# Patient Record
Sex: Female | Born: 1965
Health system: Southern US, Community
[De-identification: ages and names within clinical notes are randomized; demographics above are authoritative.]

## PROBLEM LIST (undated history)

## (undated) DIAGNOSIS — F419 Anxiety disorder, unspecified: Secondary | ICD-10-CM

## (undated) DIAGNOSIS — C4490 Unspecified malignant neoplasm of skin, unspecified: Secondary | ICD-10-CM

## (undated) DIAGNOSIS — K8020 Calculus of gallbladder without cholecystitis without obstruction: Secondary | ICD-10-CM

## (undated) DIAGNOSIS — N920 Excessive and frequent menstruation with regular cycle: Secondary | ICD-10-CM

## (undated) DIAGNOSIS — D219 Benign neoplasm of connective and other soft tissue, unspecified: Secondary | ICD-10-CM

## (undated) DIAGNOSIS — K219 Gastro-esophageal reflux disease without esophagitis: Secondary | ICD-10-CM

## (undated) DIAGNOSIS — R319 Hematuria, unspecified: Secondary | ICD-10-CM

## (undated) DIAGNOSIS — G43909 Migraine, unspecified, not intractable, without status migrainosus: Secondary | ICD-10-CM

## (undated) DIAGNOSIS — Z86010 Personal history of colonic polyps: Secondary | ICD-10-CM

## (undated) HISTORY — DX: Excessive and frequent menstruation with regular cycle: N92.0

## (undated) HISTORY — DX: Unspecified malignant neoplasm of skin, unspecified: C44.90

## (undated) HISTORY — DX: Hematuria, unspecified: R31.9

## (undated) HISTORY — DX: Migraine, unspecified, not intractable, without status migrainosus: G43.909

## (undated) HISTORY — PX: CYSTOSCOPY: SUR368

## (undated) HISTORY — DX: Benign neoplasm of connective and other soft tissue, unspecified: D21.9

## (undated) HISTORY — DX: Anxiety disorder, unspecified: F41.9

## (undated) HISTORY — DX: Gastro-esophageal reflux disease without esophagitis: K21.9

## (undated) HISTORY — DX: Calculus of gallbladder without cholecystitis without obstruction: K80.20

## (undated) HISTORY — PX: OTHER SURGICAL HISTORY: SHX169

## (undated) HISTORY — DX: Personal history of colonic polyps: Z86.010

---

## 2007-09-11 ENCOUNTER — Ambulatory Visit: Payer: Self-pay | Admitting: Gastroenterology

## 2007-10-17 ENCOUNTER — Ambulatory Visit: Payer: Self-pay

## 2009-06-03 ENCOUNTER — Ambulatory Visit: Payer: Self-pay

## 2009-07-06 ENCOUNTER — Observation Stay: Payer: Self-pay | Admitting: Internal Medicine

## 2010-06-18 ENCOUNTER — Ambulatory Visit: Payer: Self-pay | Admitting: Obstetrics and Gynecology

## 2010-12-16 DIAGNOSIS — Z860100 Personal history of colon polyps, unspecified: Secondary | ICD-10-CM

## 2010-12-16 DIAGNOSIS — K219 Gastro-esophageal reflux disease without esophagitis: Secondary | ICD-10-CM

## 2010-12-16 HISTORY — DX: Personal history of colonic polyps: Z86.010

## 2010-12-16 HISTORY — DX: Personal history of colon polyps, unspecified: Z86.0100

## 2010-12-16 HISTORY — DX: Gastro-esophageal reflux disease without esophagitis: K21.9

## 2010-12-29 ENCOUNTER — Ambulatory Visit: Payer: Self-pay | Admitting: Gastroenterology

## 2010-12-31 LAB — PATHOLOGY REPORT

## 2011-03-02 ENCOUNTER — Encounter: Payer: Self-pay | Admitting: Internal Medicine

## 2011-03-02 ENCOUNTER — Ambulatory Visit (INDEPENDENT_AMBULATORY_CARE_PROVIDER_SITE_OTHER): Payer: PRIVATE HEALTH INSURANCE | Admitting: Internal Medicine

## 2011-03-02 DIAGNOSIS — N920 Excessive and frequent menstruation with regular cycle: Secondary | ICD-10-CM | POA: Insufficient documentation

## 2011-03-02 DIAGNOSIS — Z9289 Personal history of other medical treatment: Secondary | ICD-10-CM | POA: Insufficient documentation

## 2011-03-02 DIAGNOSIS — Z9189 Other specified personal risk factors, not elsewhere classified: Secondary | ICD-10-CM

## 2011-03-02 DIAGNOSIS — G43909 Migraine, unspecified, not intractable, without status migrainosus: Secondary | ICD-10-CM | POA: Insufficient documentation

## 2011-03-02 DIAGNOSIS — Z86010 Personal history of colonic polyps: Secondary | ICD-10-CM | POA: Insufficient documentation

## 2011-03-02 MED ORDER — ZOLMITRIPTAN 5 MG NA SOLN: 1.0000 | NASAL | Status: DC | PRN

## 2011-03-02 NOTE — Progress Notes (Signed)
  Subjective:    Patient ID: Stephanie Henderson, female    DOB: June 08, 1965, 45 y.o.   MRN: [id number]  HPI  45 yo white female is here to establish primary care.  Shefeels generally well, but has a history of migrain headaches, whoch are not occurring often.    Review of Systems     Objective:   Physical Exam        Assessment & Plan:   Subjective:    Stephanie Henderson is a 45 y.o. female who presents for evaluation of headache. Symptoms began about 20 or more years ago. Generally, the headaches last about 2 days and occur monthly at the time of her period. The headaches do not seem to be related to any time of the day. The headaches are usually pounding and are located in temple.  The patient rates her most severe headaches a 7 on a scale from 1 to 10. Recently, the headaches have been stable. Work attendance or other daily activities are affected by the headaches. Precipitating factors include: menses. The headaches are usually not preceded by an aura. Associated neurologic symptoms: decreased physical activity. The patient denies dizziness, loss of balance, speech difficulties and vision problems. Home treatment has included Imitrex oral with some improvement. Other history includes: migraine headaches diagnosed in the past. Family history includes no known family members with significant headaches.  The following portions of the patient's history were reviewed and updated as appropriate: allergies, current medications, past family history, past medical history, past social history, past surgical history and problem list.  Review of Systems A comprehensive review of systems was negative.    Objective:    BP 98/60  Pulse 66  Temp(Src) 99 F (37.2 C) (Oral)  Wt 106 lb (48.081 kg)  SpO2 98%  LMP 02/20/2011 General appearance: alert, cooperative and appears stated age Head: Normocephalic, without obvious abnormality, atraumatic Eyes: conjunctivae/corneas clear. PERRL, EOM's intact. Fundi  benign. Ears: normal TM's and external ear canals both ears Nose: Nares normal. Septum midline. Mucosa normal. No drainage or sinus tenderness. Throat: lips, mucosa, and tongue normal; teeth and gums normal Neck: no adenopathy, no carotid bruit, no JVD, supple, symmetrical, trachea midline and thyroid not enlarged, symmetric, no tenderness/mass/nodules Lungs: clear to auscultation bilaterally Heart: regular rate and rhythm, S1, S2 normal, no murmur, click, rub or gallop and normal apical impulse Abdomen: soft, non-tender; bowel sounds normal; no masses,  no organomegaly    Assessment:    Menstrual migraine    Plan:    Lie in darkened room and apply cold packs as needed for pain. Side effect profile discussed in detail. Asked to keep headache diary. Patient reassured that neurodiagnostic workup not indicated from benign H&P.

## 2011-06-04 ENCOUNTER — Ambulatory Visit: Payer: PRIVATE HEALTH INSURANCE | Admitting: Internal Medicine

## 2011-06-09 ENCOUNTER — Ambulatory Visit: Payer: Self-pay | Admitting: Obstetrics and Gynecology

## 2011-07-28 ENCOUNTER — Ambulatory Visit: Payer: Self-pay | Admitting: Obstetrics and Gynecology

## 2011-10-13 ENCOUNTER — Ambulatory Visit: Payer: Self-pay | Admitting: Obstetrics and Gynecology

## 2011-10-13 LAB — CBC
HCT: 28.9 % — ABNORMAL LOW (ref 35.0–47.0)
MCH: 22.9 pg — ABNORMAL LOW (ref 26.0–34.0)
MCHC: 30.5 g/dL — ABNORMAL LOW (ref 32.0–36.0)
MCV: 75 fL — ABNORMAL LOW (ref 80–100)
Platelet: 243 10*3/uL (ref 150–440)
RBC: 3.84 10*6/uL (ref 3.80–5.20)
RDW: 17.2 % — ABNORMAL HIGH (ref 11.5–14.5)
WBC: 5.7 10*3/uL (ref 3.6–11.0)

## 2011-10-13 LAB — PREGNANCY, URINE: Pregnancy Test, Urine: NEGATIVE m[IU]/mL

## 2011-10-16 HISTORY — PX: VAGINAL HYSTERECTOMY: SUR661

## 2011-10-18 ENCOUNTER — Ambulatory Visit: Payer: Self-pay | Admitting: Obstetrics and Gynecology

## 2012-05-01 ENCOUNTER — Encounter: Payer: Self-pay | Admitting: Internal Medicine

## 2012-05-01 ENCOUNTER — Ambulatory Visit (INDEPENDENT_AMBULATORY_CARE_PROVIDER_SITE_OTHER): Payer: PRIVATE HEALTH INSURANCE | Admitting: Internal Medicine

## 2012-05-01 VITALS — BP 98/56 | HR 65 | Temp 98.4°F | Resp 12 | Ht 62.0 in | Wt 111.5 lb

## 2012-05-01 DIAGNOSIS — J0100 Acute maxillary sinusitis, unspecified: Secondary | ICD-10-CM

## 2012-05-01 MED ORDER — LEVOFLOXACIN 500 MG PO TABS: 500.0000 mg | ORAL_TABLET | Freq: Every day | ORAL | Status: DC

## 2012-05-01 MED ORDER — SUMATRIPTAN SUCCINATE 100 MG PO TABS: 100.0000 mg | ORAL_TABLET | Freq: Once | ORAL | Status: DC

## 2012-05-01 MED ORDER — PREDNISONE (PAK) 10 MG PO TABS: ORAL_TABLET | ORAL | Status: DC

## 2012-05-01 NOTE — Progress Notes (Signed)
Patient ID: Stephanie Henderson, female   DOB: [birth year], 46 y.o.   MRN: [id number]   Patient Active Problem List  Diagnosis  . Migraine syndrome  . Menorrhagia  . GERD (gastroesophageal reflux disease)  . Hx of colonic polyps  . History of mammogram  . Sinusitis, acute maxillary    Subjective:  CC:   Chief Complaint  Patient presents with  . Sinus Problem    HPI:   Stephanie Lippertis a 46 y.o. female who presents  Past Medical History  Diagnosis Date  . Migraine syndrome     since age 45,  sporadic  . Menorrhagia     did not tolerate trial of ocps due to migraines  . GERD (gastroesophageal reflux disease) Aug 2012    with esophagitis by EGD  . Hx of colonic polyps August 2012    repeat due 2016,  5 polyps 2009, clear 2012 Marva Panda)    History reviewed. No pertinent past surgical history.       The following portions of the patient's history were reviewed and updated as appropriate: Allergies, current medications, and problem list.    Review of Systems:   12 Pt  review of systems was negative except those addressed in the HPI,     History   Social History  . Marital Status: Married    Spouse Name: N/A    Number of Children: 2  . Years of Education: N/A   Occupational History  . RN - [employer]    Social History Main Topics  . Smoking status: Never Smoker   . Smokeless tobacco: Never Used  . Alcohol Use: Yes     Comment: Rare  . Drug Use: No  . Sexually Active: Not on file   Other Topics Concern  . Not on file   Social History Narrative  . No narrative on file    Objective:  BP 98/56  Pulse 65  Temp 98.4 F (36.9 C) (Oral)  Resp 12  Ht 5\' 2"  (1.575 m)  Wt 111 lb 8 oz (50.576 kg)  BMI 20.39 kg/m2  SpO2 97%  LMP 02/20/2011  General appearance: alert, cooperative and appears stated age Ears: bilateral injected TMs with serous effusions Throat: lips, mucosa, and tongue normal; teeth and gums normal. Tonsillar  erythema Neck: no adenopathy, no carotid bruit, supple, symmetrical, trachea midline and thyroid not enlarged, symmetric, no tenderness/mass/nodules Back: symmetric, no curvature. ROM normal. No CVA tenderness. Lungs: clear to auscultation bilaterally Heart: regular rate and rhythm, S1, S2 normal, no murmur, click, rub or gallop Abdomen: soft, non-tender; bowel sounds normal; no masses,  no organomegaly Pulses: 2+ and symmetric Skin: Skin color, texture, turgor normal. No rashes or lesions Lymph nodes: Cervical, supraclavicular, and axillary nodes normal.  Assessment and Plan:  Sinusitis, acute maxillary Given chronicity of symptoms, development of facial pain and exam consistent with bacterial URI,  Will treat with empiric antibiotics, decongestants, and saline lavage.    Updated Medication List Outpatient Encounter Prescriptions as of 05/01/2012  Medication Sig Dispense Refill  . pantoprazole (PROTONIX) 40 MG tablet Take 40 mg by mouth daily.        . sucralfate (CARAFATE) 1 G tablet Take 1 g by mouth 3 (three) times daily as needed.        . SUMAtriptan (IMITREX) 100 MG tablet Take 1 tablet (100 mg total) by mouth once. As needed for migraines  10 tablet  2  . [DISCONTINUED] SUMAtriptan (IMITREX) 100 MG tablet Take 100  mg by mouth every 2 (two) hours as needed.        Marland Kitchen levofloxacin (LEVAQUIN) 500 MG tablet Take 1 tablet (500 mg total) by mouth daily.  7 tablet  0  . predniSONE (STERAPRED UNI-PAK) 10 MG tablet 6 tablets on Day 1 , then reduce by 1 tablet daily until gone  21 tablet  0  . zolmitriptan (ZOMIG) 5 MG nasal solution Place 1 spray into the nose as needed for migraine.  6 mL  3     No orders of the defined types were placed in this encounter.    No Follow-up on file.

## 2012-05-01 NOTE — Patient Instructions (Addendum)
You have a sinus/ear infection   .  I am prescribing an antibiotic (levaquin)  and prednisone taper  To manage the infectin and the inflammation in your ear/sinuses.   I also advise use of the following OTC meds to help with your other symptoms.   Take generic OTC benadryl 25 mg every 8 hours for the drainage,  Sudafed PE  10 to 30 mg every 8 hours for the congestion, you may substitute Afrin nasal spray for the nighttime dose of sudafed PE  If needed to prevent insomnia.  flushes your sinuses twice daily with Simply Saline (do over the sink because if you do it right you will spit out globs of mucus)  OTC  Delsym   As needed for cough.  Gargle with salt water as needed for sore throat.

## 2012-05-02 ENCOUNTER — Encounter: Payer: Self-pay | Admitting: Internal Medicine

## 2012-05-02 DIAGNOSIS — J0100 Acute maxillary sinusitis, unspecified: Secondary | ICD-10-CM | POA: Insufficient documentation

## 2012-05-02 NOTE — Assessment & Plan Note (Signed)
Given chronicity of symptoms, development of facial pain and exam consistent with bacterial URI,  Will treat with empiric antibiotics, decongestants, and saline lavage.   

## 2012-11-20 ENCOUNTER — Ambulatory Visit (INDEPENDENT_AMBULATORY_CARE_PROVIDER_SITE_OTHER): Payer: PRIVATE HEALTH INSURANCE | Admitting: Adult Health

## 2012-11-20 ENCOUNTER — Encounter: Payer: Self-pay | Admitting: Adult Health

## 2012-11-20 ENCOUNTER — Telehealth: Payer: Self-pay | Admitting: *Deleted

## 2012-11-20 VITALS — BP 112/66 | HR 90 | Temp 98.8°F | Resp 12 | Wt 112.0 lb

## 2012-11-20 DIAGNOSIS — R1031 Right lower quadrant pain: Secondary | ICD-10-CM

## 2012-11-20 DIAGNOSIS — R350 Frequency of micturition: Secondary | ICD-10-CM

## 2012-11-20 LAB — POCT URINALYSIS DIPSTICK
Bilirubin, UA: NEGATIVE
Glucose, UA: NEGATIVE
Ketones, UA: NEGATIVE
Leukocytes, UA: NEGATIVE
Protein, UA: NEGATIVE

## 2012-11-20 MED ORDER — CIPROFLOXACIN HCL 250 MG PO TABS: 250.0000 mg | ORAL_TABLET | Freq: Two times a day (BID) | ORAL | Status: DC

## 2012-11-20 NOTE — Patient Instructions (Addendum)
  I am sending you for a CT of abdomen and pelvis.  Please do not leave the office until this test has been scheduled.  Once the results are available we will contact you.

## 2012-11-20 NOTE — Addendum Note (Signed)
Addended by: Montine Circle D on: 11/20/2012 02:56 PM   Modules accepted: Orders

## 2012-11-20 NOTE — Telephone Encounter (Signed)
yes

## 2012-11-20 NOTE — Telephone Encounter (Signed)
Would you like a urine culture?  

## 2012-11-20 NOTE — Assessment & Plan Note (Signed)
Tender with exam. Send for CT of abdomen and pelvis.

## 2012-11-20 NOTE — Progress Notes (Signed)
Subjective:    Patient ID: Stephanie Henderson, female    DOB: [birth year], 47 y.o.   MRN: [id number]  HPI  Patient is a pleasant 47 year old female who presents to clinic with complaints of abdominal pain and pelvic pressure x 2 weeks. She went to an urgent care and was treated for UTI with 7 days of Macrobid. Patient still with ongoing symptoms. Symptoms worse with standing, walking and improves with sitting and lying. Denies fever, chills, hematuria. Hx of total hysterectomy for fibroids 2013. Recent follow up with GYN in May 2014 normal.    Past Medical History  Diagnosis Date  . Migraine syndrome     since age 75,  sporadic  . Menorrhagia     did not tolerate trial of ocps due to migraines  . GERD (gastroesophageal reflux disease) Aug 2012    with esophagitis by EGD  . Hx of colonic polyps August 2012    repeat due 2016,  5 polyps 2009, clear 2012 Marva Panda)    Past Surgical History  Procedure Laterality Date  . Vaginal hysterectomy  6/13    Family History  Problem Relation Age of Onset  . Hyperlipidemia Mother   . Mental illness Mother     depression  . Colon cancer Father   . Depression Father   . Cancer Father 75    mets to lung   . Mental illness Father   . Kidney disease Daughter     History   Social History  . Marital Status: Married    Spouse Name: N/A    Number of Children: 2  . 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 Smoking status: Never Smoker   . Smokeless tobacco: Never Used  . Alcohol Use: Yes     Comment: Rare  . Drug Use: No  . Sexually Active: Not on file   Other Topics Concern  . Not on file   Social History Narrative  . No narrative on file    Current Outpatient Prescriptions on File Prior to Visit  Medication Sig Dispense Refill  . pantoprazole (PROTONIX) 40 MG tablet Take 40 mg by mouth daily.        . sucralfate (CARAFATE) 1 G tablet Take 1 g by mouth 3  (three) times daily as needed.        . SUMAtriptan (IMITREX) 100 MG tablet Take 1 tablet (100 mg total) by mouth once. As needed for migraines  10 tablet  2   No current facility-administered medications on file prior to visit.     Review of Systems  Constitutional: Negative for fever and chills.  Gastrointestinal: Positive for abdominal pain and abdominal distention. Negative for nausea, vomiting, diarrhea, constipation and blood in stool.  Genitourinary: Positive for pelvic pain. Negative for dysuria, urgency, flank pain, vaginal discharge and difficulty urinating.       Objective:   Physical Exam  Constitutional: She is oriented to person, place, and time. She appears well-developed and well-nourished. No distress.  Cardiovascular: Normal rate and regular rhythm.   Pulmonary/Chest: Effort normal. No respiratory distress.  Abdominal: Soft. Bowel sounds are normal. She exhibits no distension and no mass. There is tenderness. There is guarding.  Neurological: She is alert and oriented to person, place, and time.  Skin: Skin is warm and dry.  Psychiatric: She has a normal mood and affect. Her behavior is normal. Judgment and thought content  normal.          Assessment & Plan:

## 2012-11-22 ENCOUNTER — Other Ambulatory Visit: Payer: Self-pay | Admitting: Adult Health

## 2012-11-22 ENCOUNTER — Telehealth: Payer: Self-pay | Admitting: Internal Medicine

## 2012-11-22 DIAGNOSIS — N839 Noninflammatory disorder of ovary, fallopian tube and broad ligament, unspecified: Secondary | ICD-10-CM

## 2012-11-22 LAB — URINE CULTURE
Colony Count: NO GROWTH
Organism ID, Bacteria: NO GROWTH

## 2012-11-22 NOTE — Progress Notes (Signed)
Pt notified of CT results and ultrasound appointment. 

## 2012-11-22 NOTE — Telephone Encounter (Signed)
Patient has an apt with [facility] (same place she had her CT) on Friday July 11th at 10:45 a.m. Arrive @ 10:30 for exam. [facility] # [phone].

## 2012-11-22 NOTE — Telephone Encounter (Signed)
Left mess for patient to call our office in reference to her CT results.

## 2012-11-22 NOTE — Telephone Encounter (Signed)
Pt notified of CT results and ultrasound appointment.

## 2012-11-23 ENCOUNTER — Ambulatory Visit: Payer: Self-pay | Admitting: Obstetrics and Gynecology

## 2012-11-23 LAB — US OB TRANSVAGINAL

## 2013-02-22 ENCOUNTER — Telehealth: Payer: Self-pay | Admitting: Internal Medicine

## 2013-02-22 NOTE — Telephone Encounter (Signed)
Pt called to schedule appt for possible sinus infection.  No appt available today/tomorrow with any provider.  Can pt be added on, or does she need to go to an urgent care.  Pt will also call to see if we have any cancellations.

## 2013-02-23 NOTE — Telephone Encounter (Signed)
Spoke with pt, she was seen in Urgent Care yesterday and has been given an antibiotic for sinus infection.

## 2013-07-13 ENCOUNTER — Ambulatory Visit: Payer: Self-pay | Admitting: Gastroenterology

## 2013-07-13 LAB — HM COLONOSCOPY: HM COLON: NORMAL

## 2013-07-16 LAB — PATHOLOGY REPORT

## 2013-08-10 ENCOUNTER — Telehealth: Payer: Self-pay | Admitting: Internal Medicine

## 2013-10-01 ENCOUNTER — Encounter: Payer: Self-pay | Admitting: Internal Medicine

## 2013-12-05 ENCOUNTER — Other Ambulatory Visit: Payer: Self-pay | Admitting: Obstetrics and Gynecology

## 2013-12-05 DIAGNOSIS — Z1231 Encounter for screening mammogram for malignant neoplasm of breast: Secondary | ICD-10-CM

## 2013-12-05 LAB — HM PAP SMEAR: HM Pap smear: NEGATIVE

## 2013-12-18 ENCOUNTER — Ambulatory Visit
Admission: RE | Admit: 2013-12-18 | Discharge: 2013-12-18 | Disposition: A | Discharge status: Home / Self Care | Payer: 59 | Source: Ambulatory Visit | Attending: Obstetrics and Gynecology | Admitting: Obstetrics and Gynecology

## 2013-12-18 ENCOUNTER — Encounter (INDEPENDENT_AMBULATORY_CARE_PROVIDER_SITE_OTHER): Payer: Self-pay

## 2013-12-18 DIAGNOSIS — Z1231 Encounter for screening mammogram for malignant neoplasm of breast: Secondary | ICD-10-CM

## 2013-12-18 LAB — HM MAMMOGRAPHY

## 2014-09-08 NOTE — Op Note (Signed)
PATIENT NAME:  Stephanie Henderson, Stephanie Henderson MR#:  [id number] DATE OF BIRTH:  Jul 19, 1965  DATE OF PROCEDURE:  10/18/2011  PREOPERATIVE DIAGNOSIS: Symptomatic leiomyoma uteri.   POSTOPERATIVE DIAGNOSIS: Symptomatic leiomyoma uteri.   OPERATIVE PROCEDURE: Transvaginal hysterectomy with morcellation.   SURGEON: Stephanie Slim. Jibran Crookshanks, MD  FIRST ASSISTANT: None   ANESTHESIA: General LMA.   INDICATIONS: Stephanie Henderson is a 49 year old married white female, para 2-0-0-2, who presents for definitive surgery of symptomatic fibroid uterus. The patient has had a long history of heavy abnormal uterine bleeding along with dysmenorrhea that has been refractory to conservative medical therapies. She has had preoperative endometrial biopsy in February which was benign. Pelvic ultrasound demonstrated a multi-fibroid uterus. The patient desires definitive surgery at this time.   FINDINGS AT SURGERY: Findings at surgery revealed a retroverted uterus that was approximately 12 weeks' size on bimanual examination. The pelvis was gynecoid. The tubes and ovaries were grossly normal bilaterally.   DESCRIPTION OF PROCEDURE: The patient was brought to the Operating Room where she was placed in the supine position. General anesthesia with LMA was induced without difficulty. She was placed in the dorsal lithotomy position using the candy cane stirrups. A Betadine and perineal intravaginal prep and drape was performed in the standard fashion. A Foley catheter was placed and was draining clear yellow urine. A weighted speculum was placed into the vagina, and a double-tooth tenaculum was placed onto the cervix. Initial attempt at posterior colpotomy was unsuccessful. The posterior aspect of the vagina was slightly denuded with the attempts at making posterior entry. Decision was made to proceed with anterior cul-de-sac entry. The cervix was circumscribed with a scalpel and Bovie cautery. The vagina and bladder were dissected off the lower  uterine segment through both sharp and blunt dissection. Sequentially, the cardinal broad ligament complexes were clamped, cut, and stick-tied using 0 Vicryl suture. The uterosacral ligaments were previously ligated with 0 Vicryl sutures and were tagged. Once the anterior colpotomy was made, a retractor was placed in this area to facilitate the further progress of the hysterectomy. Sequentially, the cardinal broad ligament complexes were clamped, cut, and stick tied and incorporating the peritoneal mucosa. This was carried out until there was an area for window entry into the posterior cul-de-sac through sharp dissection. Once this was attained, the large weighted speculum was placed and the procedure was continued. Once the pedicles were taken down to the level of the utero-ovarian ligaments, these were then crossclamped with curved Heaney's and cut. This allowed removal of the uterus from the operative field. (Please note that prior to getting to the utero-ovarian pedicles morcellation was performed of the cervix and posterior uterine fibroids which allowed the remainder of the uterus to collapse and be removed.) The utero-ovarian ligaments were then double ligated. The first ligation was a free tie with the second ligation being a stick tie. This was done bilaterally. Evaluation of the pedicles was notable for some slight oozing from the cardinal-broad ligament complexes, and each of these were stick tied using a running stitch of 2-0 Vicryl. Good hemostasis was obtained. The denuded posterior vaginal mucosa and peritoneum were then reapproximated using a running stitch of 2-0 chromic in a running interlocking manner. The ovaries were evaluated, and there was a notable simple cyst, possibly with little hemorrhagic components as well, noted in the right ovary. These were left in situ. The peritoneum was then reapproximated using a 0 Vicryl suture in a pursestring manner. This was followed by closure of the vaginal  mucosa with 2-0 Vicryl in a simple interrupted technique. Upon completion of the procedure, all instrumentation was removed from the vagina. The patient was then awakened, mobilized, and taken to the recovery room in satisfactory condition. The patient did receive Ancef antibiotic prophylaxis.   ESTIMATED BLOOD LOSS: 350 mL.   IV FLUIDS: 1600 mL.  URINE OUTPUT: 450 mL.   ____________________________ Stephanie Slim. Stephanie Cina, MD mad:cbb D: 10/18/2011 20:37:59 ET T: 10/19/2011 10:33:12 ET JOB#: [id number]  cc: Stephanie Done A. Haliey Romberg, MD, <Dictator> Stephanie Slim Stephanie Million MD ELECTRONICALLY SIGNED 10/26/2011 13:18

## 2014-12-10 ENCOUNTER — Encounter: Payer: Self-pay | Admitting: Obstetrics and Gynecology

## 2015-01-07 ENCOUNTER — Encounter: Payer: Self-pay | Admitting: Obstetrics and Gynecology

## 2015-01-07 ENCOUNTER — Ambulatory Visit (INDEPENDENT_AMBULATORY_CARE_PROVIDER_SITE_OTHER): Payer: 59 | Admitting: Obstetrics and Gynecology

## 2015-01-07 VITALS — BP 110/56 | HR 65 | Ht 62.0 in | Wt 114.2 lb

## 2015-01-07 DIAGNOSIS — Z1239 Encounter for other screening for malignant neoplasm of breast: Secondary | ICD-10-CM

## 2015-01-07 DIAGNOSIS — Z90710 Acquired absence of both cervix and uterus: Secondary | ICD-10-CM

## 2015-01-07 DIAGNOSIS — Z01419 Encounter for gynecological examination (general) (routine) without abnormal findings: Secondary | ICD-10-CM | POA: Diagnosis not present

## 2015-01-07 DIAGNOSIS — Z8669 Personal history of other diseases of the nervous system and sense organs: Secondary | ICD-10-CM

## 2015-01-07 MED ORDER — SUMATRIPTAN SUCCINATE 100 MG PO TABS: 100.0000 mg | ORAL_TABLET | Freq: Once | ORAL | Status: DC

## 2015-01-07 NOTE — Progress Notes (Signed)
Patient ID: Stephanie Henderson, female   DOB: 05-13-1966, 49 y.o.   MRN: [id number] ANNUAL PREVENTATIVE CARE GYN  ENCOUNTER NOTE  Subjective:       Stephanie Henderson is a 69 y.o. No obstetric history on file. female here for a routine annual gynecologic exam.  Current complaints: 1. No gyn complaints- wants Imitrex refill and cmp 2.  Status post TVH. 3.  Migraine headaches   Gynecologic History Patient's last menstrual period was 02/20/2011. Contraception: status post hysterectomy Status post TVH Last Pap: 12/14/2013 neg/neg. Results were: normal Last mammogram: 12/28/2013 birad 1. Results were: normal  Obstetric History OB History  No data available    Past Medical History  Diagnosis Date  . Migraine syndrome     since age 31,  sporadic  . Menorrhagia     did not tolerate trial of ocps due to migraines  . GERD (gastroesophageal reflux disease) Aug 2012    with esophagitis by EGD  . Hx of colonic polyps August 2012    repeat due 2016,  5 polyps 2009, clear 2012 Gustavo Lah)  . Fibroid   . Hematuria   . Migraine headache     Past Surgical History  Procedure Laterality Date  . Laparoscopy    . Cystoscopy    . Vaginal hysterectomy  6/13    with morcellation    Current Outpatient Prescriptions on File Prior to Visit  Medication Sig Dispense Refill  . pantoprazole (PROTONIX) 40 MG tablet Take 40 mg by mouth daily.      . sucralfate (CARAFATE) 1 G tablet Take 1 g by mouth 3 (three) times daily as needed.      . SUMAtriptan (IMITREX) 100 MG tablet Take 1 tablet (100 mg total) by mouth once. As needed for migraines 10 tablet 2   No current facility-administered medications on file prior to visit.    Allergies  Allergen Reactions  . Other Nausea And Vomiting  . Tetracyclines & Related Diarrhea and Nausea Only    Social History   Social History  . Marital Status: Married    Spouse Name: N/A  . Number of Children: 2  . 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 Smoking status: Never Smoker   . Smokeless tobacco: Never Used  . Alcohol Use: Yes     Comment: Rare  . Drug Use: No  . Sexual Activity: Yes    Birth Control/ Protection: Surgical   Other Topics Concern  . Not on file   Social History Narrative    Family History  Problem Relation Age of Onset  . Hyperlipidemia Mother   . Mental illness Mother     depression  . Colon cancer Father   . Depression Father   . Cancer Father 27    mets to lung   . Mental illness Father   . Kidney disease Daughter   . Diabetes Neg Hx   . Heart disease Neg Hx   . Breast cancer Neg Hx   . Ovarian cancer Neg Hx     The following portions of the patient's history were reviewed and updated as appropriate: allergies, current medications, past family history, past medical history, past social history, past surgical history and problem list.  Review of Systems ROS Review of Systems - General ROS: negative for - chills, fatigue, fever, hot flashes, night sweats, weight gain or weight loss Psychological ROS:  negative for - anxiety, decreased libido, depression, mood swings, physical abuse or sexual abuse Ophthalmic ROS: negative for - blurry vision, eye pain or loss of vision ENT ROS: negative for - headaches, hearing change, visual changes or vocal changes Allergy and Immunology ROS: negative for - hives, itchy/watery eyes or seasonal allergies Hematological and Lymphatic ROS: negative for - bleeding problems, bruising, swollen lymph nodes or weight loss Endocrine ROS: negative for - galactorrhea, hair pattern changes, hot flashes, malaise/lethargy, mood swings, palpitations, polydipsia/polyuria, skin changes, temperature intolerance or unexpected weight changes Breast ROS: negative for - new or changing breast lumps or nipple discharge Respiratory ROS: negative for - cough or shortness of breath Cardiovascular ROS: negative for - chest pain,  irregular heartbeat, palpitations or shortness of breath Gastrointestinal ROS: no abdominal pain, change in bowel habits, or black or bloody stools Genito-Urinary ROS: no dysuria, trouble voiding, or hematuria Musculoskeletal ROS: negative for - joint pain or joint stiffness Neurological ROS: negative for - bowel and bladder control changes Dermatological ROS: negative for rash and skin lesion changes   Objective:   BP 110/56 mmHg  Pulse 65  Ht 5\' 2"  (1.575 m)  Wt 114 lb 3.2 oz (51.801 kg)  BMI 20.88 kg/m2  LMP 02/20/2011 CONSTITUTIONAL: Well-developed, well-nourished female in no acute distress.  PSYCHIATRIC: Normal mood and affect. Normal behavior. Normal judgment and thought content. [place]: Alert and oriented to person, place, and time. Normal muscle tone coordination. No cranial nerve deficit noted. HENT:  Normocephalic, atraumatic, External right and left ear normal. Oropharynx is clear and moist EYES: Conjunctivae and EOM are normal. Pupils are equal, round, and reactive to light. No scleral icterus.  NECK: Normal range of motion, supple, no masses.  Normal thyroid.  SKIN: Skin is warm and dry. No rash noted. Not diaphoretic. No erythema. No pallor. CARDIOVASCULAR: Normal heart rate noted, regular rhythm, no murmur. RESPIRATORY: Clear to auscultation bilaterally. Effort and breath sounds normal, no problems with respiration noted. BREASTS: Symmetric in size. No masses, skin changes, nipple drainage, or lymphadenopathy. ABDOMEN: Soft, normal bowel sounds, no distention noted.  No tenderness, rebound or guarding.  BLADDER: Normal PELVIC:  External Genitalia: Normal  BUS: Normal  Vagina: Normal  Cervix: Surgically absent  Uterus: Surgically absent  Adnexa: Normal  RV: External Exam NormaI, No Rectal Masses and Normal Sphincter tone  MUSCULOSKELETAL: Normal range of motion. No tenderness.  No cyanosis, clubbing, or edema.  2+ distal pulses. LYMPHATIC: No Axillary,  Supraclavicular, or Inguinal Adenopathy.    Assessment:   Annual gynecologic examination 49 y.o. Contraception: status post hysterectomy (TVH) Normal BMI Migraine headaches  Plan:  Pap: Not needed Mammogram: Ordered Stool Guaiac Testing:  Not Indicated Labs: lipid,tsh,fbs,a1c,cmp(per pt) Routine preventative health maintenance measures emphasized: Exercise/Diet/Weight control, Tobacco Warnings and Alcohol/Substance use risks  Return to [place], CMA  Brayton Mars, MD

## 2015-01-07 NOTE — Patient Instructions (Signed)
1.  No Pap smear needed. 2.  Mammogram recommended. 3.  Calcium 1200 mg a day with vitamin D is encouraged. 4.  Weightbearing exercise 30 minutes a day 5 days a week is encouraged. 5.  Return in one year for annual exam

## 2015-01-14 ENCOUNTER — Other Ambulatory Visit: Payer: Self-pay

## 2015-01-14 ENCOUNTER — Other Ambulatory Visit: Payer: 59

## 2015-01-14 DIAGNOSIS — Z1239 Encounter for other screening for malignant neoplasm of breast: Secondary | ICD-10-CM

## 2015-01-14 DIAGNOSIS — Z01419 Encounter for gynecological examination (general) (routine) without abnormal findings: Secondary | ICD-10-CM

## 2015-01-15 ENCOUNTER — Telehealth: Payer: Self-pay

## 2015-01-15 LAB — COMPREHENSIVE METABOLIC PANEL
ALBUMIN: 4.4 g/dL (ref 3.5–5.5)
ALK PHOS: 37 IU/L — AB (ref 39–117)
ALT: 21 IU/L (ref 0–32)
AST: 19 IU/L (ref 0–40)
Albumin/Globulin Ratio: 2.3 (ref 1.1–2.5)
BUN / CREAT RATIO: 25 — AB (ref 9–23)
BUN: 14 mg/dL (ref 6–24)
Bilirubin Total: 0.4 mg/dL (ref 0.0–1.2)
CHLORIDE: 98 mmol/L (ref 97–108)
CO2: 25 mmol/L (ref 18–29)
CREATININE: 0.57 mg/dL (ref 0.57–1.00)
Calcium: 9.4 mg/dL (ref 8.7–10.2)
GFR calc Af Amer: 128 mL/min/{1.73_m2} (ref >59)
GFR calc non Af Amer: 111 mL/min/{1.73_m2} (ref >59)
GLUCOSE: 66 mg/dL (ref 65–99)
Globulin, Total: 1.9 g/dL (ref 1.5–4.5)
Potassium: 4.4 mmol/L (ref 3.5–5.2)
Sodium: 137 mmol/L (ref 134–144)
TOTAL PROTEIN: 6.3 g/dL (ref 6.0–8.5)

## 2015-01-15 LAB — LIPID PANEL
CHOL/HDL RATIO: 3.2 ratio (ref 0.0–4.4)
CHOLESTEROL TOTAL: 172 mg/dL (ref 100–199)
HDL: 54 mg/dL (ref >39)
LDL Calculated: 108 mg/dL — ABNORMAL HIGH (ref 0–99)
TRIGLYCERIDES: 49 mg/dL (ref 0–149)
VLDL Cholesterol Cal: 10 mg/dL (ref 5–40)

## 2015-01-15 LAB — VITAMIN D 25 HYDROXY (VIT D DEFICIENCY, FRACTURES): Vit D, 25-Hydroxy: 31.2 ng/mL (ref 30.0–100.0)

## 2015-01-15 LAB — HEMOGLOBIN A1C
Est. average glucose Bld gHb Est-mCnc: 120 mg/dL
HEMOGLOBIN A1C: 5.8 % — AB (ref 4.8–5.6)

## 2015-01-15 LAB — TSH: TSH: 1.85 u[IU]/mL (ref 0.450–4.500)

## 2015-01-15 NOTE — Telephone Encounter (Signed)
-----   Message from Brayton Mars, MD sent at 01/15/2015  7:58 AM EDT ----- Please notify - Abnormal Labs Hemoglobin A1c, consistent with prediabetes.  Encouraged healthy eating and exercise and repeat in one year. Lipid one panel is good. CMP panel good.

## 2015-01-15 NOTE — Telephone Encounter (Signed)
Pt aware. Per pt request labs mailed to pt.

## 2015-01-22 ENCOUNTER — Telehealth: Payer: Self-pay | Admitting: Obstetrics and Gynecology

## 2015-01-22 DIAGNOSIS — R7303 Prediabetes: Secondary | ICD-10-CM

## 2015-01-22 NOTE — Telephone Encounter (Signed)
Patient called requesting [facility].Thanks

## 2015-01-23 NOTE — Telephone Encounter (Signed)
Pt aware referral in.

## 2015-01-29 ENCOUNTER — Encounter: Payer: Self-pay | Admitting: Dietician

## 2015-01-29 ENCOUNTER — Encounter: Payer: 59 | Attending: Obstetrics and Gynecology | Admitting: Dietician

## 2015-01-29 VITALS — Ht 62.0 in | Wt 110.9 lb

## 2015-01-29 DIAGNOSIS — R7303 Prediabetes: Secondary | ICD-10-CM

## 2015-01-29 DIAGNOSIS — R7309 Other abnormal glucose: Secondary | ICD-10-CM | POA: Insufficient documentation

## 2015-01-29 NOTE — Patient Instructions (Addendum)
   Try measuring starch portions, keep rice or pasta portions to 1 cup or less per meal. Plan to eat half of a restaurant meal, save the other have for later.  Choose healthy snacks such as fruit, yogurt, 1/4 cup nuts, healthy granola bars.   Work out a Secretary/administrator for some regular exercise.   Keep portions of snacks in control, try taking a small portion from the large container and putting the rest away.

## 2015-01-29 NOTE — Progress Notes (Signed)
Medical Nutrition Therapy: Visit start time: 0900  end time: 1000  Assessment:  Diagnosis: pre-diabetes Past medical history: IBS, GERD Psychosocial issues/ stress concerns: none reported Preferred learning method:  . Auditory . Visual  Current weight: 110.9lbs  Height: 5'2" Medications, supplements: reviewed list in chart with patient Progress and evaluation: Patient reports HbA1C readings of 5.7% over the past 2 years, and recent reading of 5.8%.          She reports some high-processed carbohydrate intake, which she is now working to reduce.    Physical activity: ADLs, including some on-the-job walking  Dietary Intake:  Usual eating pattern includes 3 meals and 2 snacks per day. Dining out frequency: 1 meals per week.  Breakfast: eggs or yogurt, almond butter on wheat toast Snack: none Lunch: boiled egg or granola bar or cheese stick, or fruit Snack: chips or cookie about 4pm due to hunger Supper: chicken or fish, vegetable, starch potato, white rice, pasta. Often on the go.  Snack: cookies, chips,etc.  Beverages: water, propel water  Nutrition Care Education: Topics covered: diabetes prevention Basic nutrition: basic food groups, appropriate nutrient balance, appropriate meal and snack schedule   Diabetes:  goals for BGs, appropriate meal and snack schedule, appropriate carb intake and balance, 1350kcal meal plan with 11 servings of CHO foods daily, 30-45g each meal  Discussed appropriate portions of carb and protein foods, and strategies for controlling portions.  Other lifestyle changes:  Effects of other factors such as stress, sleep, pain, activity level on BG control.   Nutritional Diagnosis:  NI-5.8.2 Excessive carbohydrate intake As related to intake of processed carbohydrate foods, and food portions.  As evidenced by patient report.  Intervention: Discussion as noted above.    Set goals to control carb portions, to include healthier carb choices, and to increase  physical exercise.   No follow-up scheduled at this time, patient will schedule later if needed.   Education Materials given:  . General diet guidelines for Diabetes . Food lists/ Planning A Balanced Meal . Sample meal pattern/ menus; Quick and Healthy Meal Ideas . Goals/ instructions  Learner/ who was taught:  . Patient   Level of understanding: Marland Kitchen Verbalizes/ demonstrates competency  Demonstrated degree of understanding via:   Teach back Learning barriers: . None  Willingness to learn/ readiness for change: . Eager, change in progress   Monitoring and Evaluation:  Dietary intake, exercise, BG control, and body weight      follow up: prn

## 2015-03-12 ENCOUNTER — Ambulatory Visit
Admission: RE | Admit: 2015-03-12 | Discharge: 2015-03-12 | Disposition: A | Discharge status: Home / Self Care | Payer: 59 | Source: Ambulatory Visit | Attending: Obstetrics and Gynecology | Admitting: Obstetrics and Gynecology

## 2015-03-12 DIAGNOSIS — Z1239 Encounter for other screening for malignant neoplasm of breast: Secondary | ICD-10-CM

## 2015-05-16 ENCOUNTER — Encounter: Payer: Self-pay | Admitting: Physician Assistant

## 2015-05-16 ENCOUNTER — Ambulatory Visit: Payer: Self-pay | Admitting: Family

## 2015-05-16 VITALS — BP 104/70 | HR 60 | Temp 98.5°F

## 2015-05-16 DIAGNOSIS — J019 Acute sinusitis, unspecified: Secondary | ICD-10-CM

## 2015-05-16 MED ORDER — LEVOFLOXACIN 500 MG PO TABS: 500.0000 mg | ORAL_TABLET | Freq: Every day | ORAL | Status: DC

## 2015-05-16 NOTE — Progress Notes (Signed)
S/ cold sx x 2 weeks now localised sxs to left frontal and max , fatigue   O/ VSS mildly ill ENT: turbinates swollen, Red left excoriated and with purulent d/c + L frontal and left max tenderness throat clear neck supple heart rsr lungs clear  A/ sinusitis  P/ levaquin 500 mg #10 neti pot / saline products bid and prn.

## 2015-05-18 HISTORY — PX: BREAST BIOPSY: SHX20

## 2015-05-27 DIAGNOSIS — Z800 Family history of malignant neoplasm of digestive organs: Secondary | ICD-10-CM | POA: Diagnosis not present

## 2015-05-27 DIAGNOSIS — K219 Gastro-esophageal reflux disease without esophagitis: Secondary | ICD-10-CM | POA: Diagnosis not present

## 2015-05-27 DIAGNOSIS — R1084 Generalized abdominal pain: Secondary | ICD-10-CM | POA: Diagnosis not present

## 2015-06-02 ENCOUNTER — Ambulatory Visit: Payer: Self-pay | Admitting: Physician Assistant

## 2015-06-02 ENCOUNTER — Encounter: Payer: Self-pay | Admitting: Physician Assistant

## 2015-06-02 VITALS — BP 118/80 | Temp 98.6°F

## 2015-06-02 DIAGNOSIS — J0191 Acute recurrent sinusitis, unspecified: Secondary | ICD-10-CM

## 2015-06-02 MED ORDER — CLARITHROMYCIN ER 500 MG PO TB24: 1000.0000 mg | ORAL_TABLET | Freq: Every day | ORAL | Status: DC

## 2015-06-02 MED ORDER — METHYLPREDNISOLONE 4 MG PO TBPK: ORAL_TABLET | ORAL | Status: DC

## 2015-06-02 NOTE — Progress Notes (Signed)
S: seen here at end of December for sinusitis and uri, given levaquin, felt better after finishing antibiotic then last week sx started again, no fever/chills, just a lot of head pressure and congestion, using otc meds without relief  O; vitals wnl, nad, tms dull, nasal mucosa grossly red and swollen, closing off nasal passage, throat injected, neck supple no lymph, lungs c t a, cv rrr  A: acute recurrent sinusitis  P: biaxin xl 500mg  2 po qd x 14d, medrol dose pack, if not better in 1 week will refer to ENT

## 2015-08-29 ENCOUNTER — Other Ambulatory Visit: Payer: Self-pay | Admitting: Obstetrics and Gynecology

## 2015-09-05 DIAGNOSIS — H524 Presbyopia: Secondary | ICD-10-CM | POA: Diagnosis not present

## 2015-09-17 DIAGNOSIS — L578 Other skin changes due to chronic exposure to nonionizing radiation: Secondary | ICD-10-CM | POA: Diagnosis not present

## 2015-09-17 DIAGNOSIS — Z1283 Encounter for screening for malignant neoplasm of skin: Secondary | ICD-10-CM | POA: Diagnosis not present

## 2015-09-17 DIAGNOSIS — D229 Melanocytic nevi, unspecified: Secondary | ICD-10-CM | POA: Diagnosis not present

## 2015-09-17 DIAGNOSIS — D485 Neoplasm of uncertain behavior of skin: Secondary | ICD-10-CM | POA: Diagnosis not present

## 2015-09-17 DIAGNOSIS — Z85828 Personal history of other malignant neoplasm of skin: Secondary | ICD-10-CM | POA: Diagnosis not present

## 2015-09-17 DIAGNOSIS — D2262 Melanocytic nevi of left upper limb, including shoulder: Secondary | ICD-10-CM | POA: Diagnosis not present

## 2015-09-17 DIAGNOSIS — L700 Acne vulgaris: Secondary | ICD-10-CM | POA: Diagnosis not present

## 2015-09-17 DIAGNOSIS — D1800 Hemangioma unspecified site: Secondary | ICD-10-CM | POA: Diagnosis not present

## 2015-09-17 DIAGNOSIS — L821 Other seborrheic keratosis: Secondary | ICD-10-CM | POA: Diagnosis not present

## 2015-09-17 DIAGNOSIS — L812 Freckles: Secondary | ICD-10-CM | POA: Diagnosis not present

## 2016-01-12 NOTE — Progress Notes (Signed)
ANNUAL PREVENTATIVE CARE GYN  ENCOUNTER NOTE  Subjective:       Stephanie Henderson is a 50 y.o. No obstetric history on file. female here for a routine annual gynecologic exam.  Current complaints: 1.  ? UTI OR Y/I  Patient self treated 3 days ago with Monistat 3 for possible yeast infection; symptoms diminished but some are still persisting.   Gynecologic History Patient's last menstrual period was 02/20/2011. Contraception: status post hysterectomy  TVH With morcellation Last Pap: 11/2013 neg/neg. Results were: normal Last mammogram: 02/2015 birad 1 cat D. Results were: normal  Obstetric History OB History  No data available    Past Medical History:  Diagnosis Date  . Fibroid   . GERD (gastroesophageal reflux disease) Aug 2012   with esophagitis by EGD  . Hematuria   . Hx of colonic polyps August 2012   repeat due 2016,  5 polyps 2009, clear 2012 Gustavo Lah)  . Menorrhagia    did not tolerate trial of ocps due to migraines  . Migraine headache   . Migraine syndrome    since age 12,  sporadic    Past Surgical History:  Procedure Laterality Date  . CYSTOSCOPY    . laparoscopy    . VAGINAL HYSTERECTOMY  6/13   with morcellation    Current Outpatient Prescriptions on File Prior to Visit  Medication Sig Dispense Refill  . clarithromycin (BIAXIN XL) 500 MG 24 hr tablet Take 2 tablets (1,000 mg total) by mouth daily. 28 tablet 0  . methylPREDNISolone (MEDROL DOSEPAK) 4 MG TBPK tablet Take 6 pills on day one then decrease by 1 pill each day 21 tablet 0  . pantoprazole (PROTONIX) 40 MG tablet Take 40 mg by mouth daily.      . sucralfate (CARAFATE) 1 G tablet Take 1 g by mouth 3 (three) times daily as needed.      . SUMAtriptan (IMITREX) 100 MG tablet TAKE 1 TABLET BY MOUTH AS NEEDED FOR MIGRAINE 10 tablet 2   No current facility-administered medications on file prior to visit.     Allergies  Allergen Reactions  . Other Nausea And Vomiting  . Propoxyphene     Other  reaction(s): Other (See Comments) Unknown  . Tetracyclines & Related Diarrhea and Nausea Only    Social History   Social History  . Marital status: Married    Spouse name: N/A  . Number of children: 2  . Years of education: N/A   Occupational History  . RN - [employer]    Social History Main Topics  . Smoking status: Never Smoker  . Smokeless tobacco: Never Used  . Alcohol use 2.4 - 3.0 oz/week    4 - 5 Standard drinks or equivalent per week  . Drug use: No  . Sexual activity: Yes    Birth control/ protection: Surgical   Other Topics Concern  . Not on file   Social History Narrative  . No narrative on file    Family History  Problem Relation Age of Onset  . Hyperlipidemia Mother   . Mental illness Mother     depression  . Colon cancer Father   . Depression Father   . Cancer Father 12    mets to lung   . Mental illness Father   . Kidney disease Daughter   . Diabetes Neg Hx   . Heart disease Neg Hx   . Breast cancer Neg Hx   . Ovarian cancer Neg Hx  The following portions of the patient's history were reviewed and updated as appropriate: allergies, current medications, past family history, past medical history, past social history, past surgical history and problem list.  Review of Systems ROS Review of Systems - General ROS: negative for - chills, fatigue, fever, hot flashes, night sweats, weight gain or weight loss Psychological ROS: negative for - anxiety, decreased libido, depression, mood swings, physical abuse or sexual abuse Ophthalmic ROS: negative for - blurry vision, eye pain or loss of vision ENT ROS: negative for - headaches, hearing change, visual changes or vocal changes Allergy and Immunology ROS: negative for - hives, itchy/watery eyes or seasonal allergies Hematological and Lymphatic ROS: negative for - bleeding problems, bruising, swollen lymph nodes or weight loss Endocrine ROS: negative for - galactorrhea, hair pattern  changes, hot flashes, malaise/lethargy, mood swings, palpitations, polydipsia/polyuria, skin changes, temperature intolerance or unexpected weight changes Breast ROS: negative for - new or changing breast lumps or nipple discharge Respiratory ROS: negative for - cough or shortness of breath Cardiovascular ROS: negative for - chest pain, irregular heartbeat, palpitations or shortness of breath Gastrointestinal ROS: no abdominal pain, change in bowel habits, or black or bloody stools Genito-Urinary ROS: no dysuria, trouble voiding, or hematuria Musculoskeletal ROS: negative for - joint pain or joint stiffness Neurological ROS: negative for - bowel and bladder control changes Dermatological ROS: negative for rash and skin lesion changes   Objective:   LMP 02/20/2011   BP 107/66   Pulse 76   Ht 5\' 2"  (1.575 m)   Wt 114 lb 3.2 oz (51.8 kg)   LMP 02/20/2011   BMI 20.89 kg/m  CONSTITUTIONAL: Well-developed, well-nourished female in no acute distress.  PSYCHIATRIC: Normal mood and affect. Normal behavior. Normal judgment and thought content. [place]: Alert and oriented to person, place, and time. Normal muscle tone coordination. No cranial nerve deficit noted. HENT:  Normocephalic, atraumatic, External right and left ear normal. Oropharynx is clear and moist EYES: Conjunctivae and EOM are normal. Pupils are equal, round, and reactive to light. No scleral icterus.  NECK: Normal range of motion, supple, no masses.  Normal thyroid.  SKIN: Skin is warm and dry. No rash noted. Not diaphoretic. No erythema. No pallor. CARDIOVASCULAR: Normal heart rate noted, regular rhythm, no murmur. RESPIRATORY: Clear to auscultation bilaterally. Effort and breath sounds normal, no problems with respiration noted. BREASTS: Symmetric in size. No masses, skin changes, nipple drainage, or lymphadenopathy. ABDOMEN: Soft, normal bowel sounds, no distention noted.  No tenderness, rebound or guarding.  BLADDER:  Normal PELVIC:  External Genitalia: Normal  BUS: Normal  Vagina: Normal; minimal thin white discharge present  Cervix: Surgically absent  Uterus: Surgically absent  Adnexa: Normal  RV: External Exam NormaI, No Rectal Masses and Normal Sphincter tone  MUSCULOSKELETAL: Normal range of motion. No tenderness.  No cyanosis, clubbing, or edema.  2+ distal pulses. LYMPHATIC: No Axillary, Supraclavicular, or Inguinal Adenopathy.    Assessment:   Annual gynecologic examination 50 y.o. Contraception: status post hysterectomy Normal BMI Problem List Items Addressed This Visit    None    Visit Diagnoses    Well woman exam with routine gynecological exam    -  Primary   Screening for breast cancer       Status post vaginal hysterectomy       Hx of migraine headaches        UTI symptoms 3 days status post self treatment for yeast  Plan:  Pap: Not needed and Not done Mammogram:  Ordered Stool Guaiac Testing:  Not Indicated Labs: lipid vit d a1c tsh fbs  Routine preventative health maintenance measures emphasized: Exercise/Diet/Weight control, Tobacco Warnings, Alcohol/Substance use risks, Stress Management and Peer Pressure Issues  Urinalysis with urine culture is obtained If vaginitis symptoms persist over the next week, call for antifungal therapy and or reassessment Return to [place], CMA  Brayton Mars, MD

## 2016-01-13 ENCOUNTER — Ambulatory Visit (INDEPENDENT_AMBULATORY_CARE_PROVIDER_SITE_OTHER): Payer: 59 | Admitting: Obstetrics and Gynecology

## 2016-01-13 ENCOUNTER — Encounter: Payer: Self-pay | Admitting: Obstetrics and Gynecology

## 2016-01-13 VITALS — BP 107/66 | HR 76 | Ht 62.0 in | Wt 114.2 lb

## 2016-01-13 DIAGNOSIS — Z8669 Personal history of other diseases of the nervous system and sense organs: Secondary | ICD-10-CM

## 2016-01-13 DIAGNOSIS — Z1239 Encounter for other screening for malignant neoplasm of breast: Secondary | ICD-10-CM | POA: Diagnosis not present

## 2016-01-13 DIAGNOSIS — R399 Unspecified symptoms and signs involving the genitourinary system: Secondary | ICD-10-CM | POA: Diagnosis not present

## 2016-01-13 DIAGNOSIS — Z90710 Acquired absence of both cervix and uterus: Secondary | ICD-10-CM

## 2016-01-13 DIAGNOSIS — Z01419 Encounter for gynecological examination (general) (routine) without abnormal findings: Secondary | ICD-10-CM | POA: Diagnosis not present

## 2016-01-13 LAB — POCT URINALYSIS DIPSTICK
BILIRUBIN UA: NEGATIVE
Glucose, UA: NEGATIVE
Ketones, UA: NEGATIVE
LEUKOCYTES UA: NEGATIVE
NITRITE UA: NEGATIVE
PH UA: 6
PROTEIN UA: 6
Spec Grav, UA: 1.01
UROBILINOGEN UA: NEGATIVE

## 2016-01-13 MED ORDER — SUMATRIPTAN SUCCINATE 100 MG PO TABS: 100.0000 mg | ORAL_TABLET | ORAL | 2 refills | Status: DC | PRN

## 2016-01-13 NOTE — Addendum Note (Signed)
Addended by: Elouise Munroe on: 01/13/2016 04:24 PM   Modules accepted: Orders

## 2016-01-13 NOTE — Patient Instructions (Signed)
1. No Pap smear done 2. Mammogram ordered 3. Continue with healthy eating and exercise 4. Urinalysis with culture is obtained to rule out UTI 5.If vaginitis symptoms persist over the next week, call for antifungal prescription and/or reassessment 6. Return in 1 day for screening blood work 7. Return in 1 year for annual exam

## 2016-01-14 ENCOUNTER — Other Ambulatory Visit: Payer: 59

## 2016-01-14 DIAGNOSIS — Z01419 Encounter for gynecological examination (general) (routine) without abnormal findings: Secondary | ICD-10-CM | POA: Diagnosis not present

## 2016-01-15 LAB — LIPID PANEL
CHOLESTEROL TOTAL: 170 mg/dL (ref 100–199)
Chol/HDL Ratio: 3 ratio units (ref 0.0–4.4)
HDL: 56 mg/dL (ref >39)
LDL Calculated: 106 mg/dL — ABNORMAL HIGH (ref 0–99)
TRIGLYCERIDES: 39 mg/dL (ref 0–149)
VLDL CHOLESTEROL CAL: 8 mg/dL (ref 5–40)

## 2016-01-15 LAB — GLUCOSE, RANDOM: Glucose: 80 mg/dL (ref 65–99)

## 2016-01-15 LAB — HEMOGLOBIN A1C
ESTIMATED AVERAGE GLUCOSE: 105 mg/dL
HEMOGLOBIN A1C: 5.3 % (ref 4.8–5.6)

## 2016-01-15 LAB — URINE CULTURE

## 2016-01-15 LAB — TSH: TSH: 2.28 u[IU]/mL (ref 0.450–4.500)

## 2016-01-15 LAB — VITAMIN D 25 HYDROXY (VIT D DEFICIENCY, FRACTURES): VIT D 25 HYDROXY: 33.7 ng/mL (ref 30.0–100.0)

## 2016-04-05 ENCOUNTER — Ambulatory Visit
Admission: RE | Admit: 2016-04-05 | Discharge: 2016-04-05 | Disposition: A | Discharge status: Home / Self Care | Payer: 59 | Source: Ambulatory Visit | Attending: Obstetrics and Gynecology | Admitting: Obstetrics and Gynecology

## 2016-04-05 DIAGNOSIS — Z1239 Encounter for other screening for malignant neoplasm of breast: Secondary | ICD-10-CM

## 2016-04-05 DIAGNOSIS — Z1231 Encounter for screening mammogram for malignant neoplasm of breast: Secondary | ICD-10-CM | POA: Diagnosis not present

## 2016-04-12 ENCOUNTER — Other Ambulatory Visit: Payer: Self-pay | Admitting: Obstetrics and Gynecology

## 2016-04-12 DIAGNOSIS — R928 Other abnormal and inconclusive findings on diagnostic imaging of breast: Secondary | ICD-10-CM

## 2016-04-14 ENCOUNTER — Ambulatory Visit (INDEPENDENT_AMBULATORY_CARE_PROVIDER_SITE_OTHER): Payer: 59 | Admitting: Family Medicine

## 2016-04-14 ENCOUNTER — Encounter: Payer: Self-pay | Admitting: Family Medicine

## 2016-04-14 VITALS — BP 105/55 | HR 92 | Temp 98.6°F | Resp 12 | Wt 112.1 lb

## 2016-04-14 DIAGNOSIS — M542 Cervicalgia: Secondary | ICD-10-CM | POA: Diagnosis not present

## 2016-04-14 DIAGNOSIS — R51 Headache: Secondary | ICD-10-CM

## 2016-04-14 DIAGNOSIS — R519 Headache, unspecified: Secondary | ICD-10-CM

## 2016-04-14 MED ORDER — PREDNISONE 10 MG (48) PO TBPK: ORAL_TABLET | ORAL | 0 refills | Status: DC

## 2016-04-14 NOTE — Patient Instructions (Signed)
Follow up closely with Dr. Derrel Nip.  Call with concerns.  Take care  Dr. Lacinda Axon

## 2016-04-14 NOTE — Assessment & Plan Note (Signed)
New problem. New type per patient. No improvement with Imitrex. Has some features of migraine. Questionable MSK etiology. Treating with prednisone taper.

## 2016-04-14 NOTE — Assessment & Plan Note (Signed)
New problem. Suspect MSK. Treating with Pred taper.

## 2016-04-14 NOTE — Progress Notes (Signed)
Subjective:  Patient ID: ANNALYNNE FIUME, female    DOB: 02/04/1966  Age: 50 y.o. MRN: [id number]  CC: Headache, neck pain  HPI:  50 year old female with a history of migraine presents with complaints of headache and neck pain.  Patient reports that she has had intermittent headache since the end of October. She states that the headaches are frequent. Located in the left temporal region and left occipital region. She reports associated nausea. She states that it is different than her prior migraine headaches. No improvement with Imitrex. She reports associated dizziness as well. She's taken some ibuprofen without improvement. No known exacerbating factors.  Additionally, for the past week she's had left-sided neck pain. She reports associated numbness/tingling in the left arm. No fall, trauma, injury. No improvement with Advil. She does note recent stress at home. No other known inciting factors. No known exacerbating factors. No other complaints at this time.  Social Hx   Social History   Social History  . Marital status: Married    Spouse name: N/A  . Number of children: 2  . Years of education: N/A   Occupational History  . RN - [employer]    Social History Main Topics  . Smoking status: Never Smoker  . Smokeless tobacco: Never Used  . Alcohol use 2.4 - 3.0 oz/week    4 - 5 Standard drinks or equivalent per week     Comment: OCCAS  . Drug use: No  . Sexual activity: Yes    Birth control/ protection: Surgical   Other Topics Concern  . None   Social History Narrative  . None    Review of Systems  Musculoskeletal: Positive for neck pain.  Neurological: Positive for dizziness, numbness and headaches.   Objective:  BP (!) 105/55 (BP Location: Left Arm, Patient Position: Sitting, Cuff Size: Normal)   Pulse 92   Temp 98.6 F (37 C) (Oral)   Resp 12   Wt 112 lb 2 oz (50.9 kg)   LMP 02/20/2011   SpO2 99%   BMI 20.51 kg/m   BP/Weight 04/14/2016  01/13/2016 [id number]  Systolic BP 123456 [id number] [id number]  Diastolic BP 55 66 80  Wt. (Lbs) 112.13 114.2 -  BMI 20.51 20.89 -   Physical Exam  Constitutional: She is oriented to person, place, and time. She appears well-developed. No distress.  Cardiovascular: Normal rate and regular rhythm.   Pulmonary/Chest: Effort normal and breath sounds normal.  Musculoskeletal:  Neck - nontender to palpation. Normal range of motion.  Neurological: She is alert and oriented to person, place, and time.  Psychiatric: She has a normal mood and affect.  Vitals reviewed.  Lab Results  Component Value Date   WBC 5.7 10/13/2011   HGB 7.0 (L) 10/19/2011   HCT 28.9 (L) 10/13/2011   PLT 243 10/13/2011   GLUCOSE 80 01/14/2016   CHOL 170 01/14/2016   TRIG 39 01/14/2016   HDL 56 01/14/2016   LDLCALC 106 (H) 01/14/2016   ALT 21 01/14/2015   AST 19 01/14/2015   NA 137 01/14/2015   K 4.4 01/14/2015   CL 98 01/14/2015   CREATININE 0.57 01/14/2015   BUN 14 01/14/2015   CO2 25 01/14/2015   TSH 2.280 01/14/2016   HGBA1C 5.3 01/14/2016   Assessment & Plan:   Problem List Items Addressed This Visit    Neck pain    New problem. Suspect MSK. Treating with Pred taper.      Headache - Primary  New problem. New type per patient. No improvement with Imitrex. Has some features of migraine. Questionable MSK etiology. Treating with prednisone taper.        Meds ordered this encounter  Medications  . predniSONE (STERAPRED UNI-PAK 48 TAB) 10 MG (48) TBPK tablet    Sig: Per package instructions.    Dispense:  48 tablet    Refill:  0    Follow-up: PRN  [place]

## 2016-04-15 ENCOUNTER — Ambulatory Visit: Payer: 59 | Attending: Internal Medicine | Admitting: Physical Therapy

## 2016-04-16 ENCOUNTER — Ambulatory Visit
Admission: RE | Admit: 2016-04-16 | Discharge: 2016-04-16 | Disposition: A | Discharge status: Home / Self Care | Payer: 59 | Source: Ambulatory Visit | Attending: Obstetrics and Gynecology | Admitting: Obstetrics and Gynecology

## 2016-04-16 ENCOUNTER — Other Ambulatory Visit: Payer: Self-pay | Admitting: Obstetrics and Gynecology

## 2016-04-16 DIAGNOSIS — R928 Other abnormal and inconclusive findings on diagnostic imaging of breast: Secondary | ICD-10-CM | POA: Diagnosis not present

## 2016-04-16 DIAGNOSIS — N6011 Diffuse cystic mastopathy of right breast: Secondary | ICD-10-CM | POA: Diagnosis not present

## 2016-04-26 ENCOUNTER — Ambulatory Visit: Payer: 59 | Attending: Internal Medicine | Admitting: Physical Therapy

## 2016-04-26 DIAGNOSIS — M542 Cervicalgia: Secondary | ICD-10-CM | POA: Insufficient documentation

## 2016-04-26 DIAGNOSIS — M6281 Muscle weakness (generalized): Secondary | ICD-10-CM | POA: Insufficient documentation

## 2016-04-26 DIAGNOSIS — M436 Torticollis: Secondary | ICD-10-CM | POA: Insufficient documentation

## 2016-04-27 NOTE — Therapy (Signed)
[facility] [address] [place], [address] Phone: [phone]   Fax:  [phone]  Physical Therapy Evaluation  Patient Details  Name: Stephanie Henderson MRN: [id number] Date of Birth: Jun 20, 1965 Referring Provider: Derrel Nip  Encounter Date: 04/26/2016      PT End of Session - 04/27/16 0818    Visit Number 1   Number of Visits 13   Date for PT Re-Evaluation 06/08/16   PT Start Time [id number]   PT Stop Time 1700   PT Time Calculation (min) 45 min   Activity Tolerance Patient tolerated treatment well      Past Medical History:  Diagnosis Date  . Fibroid   . GERD (gastroesophageal reflux disease) Aug 2012   with esophagitis by EGD  . Hematuria   . Hx of colonic polyps August 2012   repeat due 2016,  5 polyps 2009, clear 2012 Gustavo Lah)  . Menorrhagia    did not tolerate trial of ocps due to migraines  . Migraine headache   . Migraine syndrome    since age 63,  sporadic    Past Surgical History:  Procedure Laterality Date  . CYSTOSCOPY    . laparoscopy    . VAGINAL HYSTERECTOMY  6/13   with morcellation    There were no vitals filed for this visit.       Subjective Assessment - 04/26/16 1613    Subjective Pt has history of chronic migraines beginning at age 29. Has a full family history of this. Has tried imotrex, which has worked well for treatment. She had no difficulty until the past 18-24 months. Reports minimal changes until the past few months. She believes this may be related to hormonal changes lately. Pt has had atypical migraine's. She is taking an oral steroid which may be improving HA.    Pertinent History Pt has L sided neck pain, mild N/T on this side.   Patient Stated Goals Decr. HA, getting back to baseline of "feeling pretty good".    Currently in Pain? Yes   Pain Score 1    Pain Location Head              Objective: 1st rib mobs grade III 3x1 min  Suboccipital release  performed x5 min.  Following treatment pt reported no change in symptoms but decr. Stiffness with cervical ROM.                PT Education - 04/27/16 0818    Education provided Yes   Education Details educated on course of PT   Person(s) Educated Patient   Methods Explanation             PT Long Term Goals - 04/27/16 0822      PT LONG TERM GOAL #1   Title Pt will reduce number of HA to less than 3 per week   Baseline 5 per week   Time 6   Period Weeks   Status New     PT LONG TERM GOAL #2   Title Pt will improve cervical AROM to 85 deg. cervical rotation pain free to reduce difficulty with driving.   Baseline 70 B   Time 6   Period Weeks   Status New     PT LONG TERM GOAL #3   Title Pt will improve DNF strength to avoid compensation with cervical extensors in neutral posture to minimize stress on neck musculature   Baseline 3 sec. hold,  goal is 8 sec. hold   Time 6   Period Weeks   Status New               Plan - 04/27/16 0819    Clinical Impression Statement Pt is a pleasant 50 y/o female with history of chronic migraines with new onset of "different" headaches which appear to be cervicogenic in nature.  Pt presents with multiple trigger points, decr. motion in cervical spine, pain in neck. Would benefit from skilled PT to address these issues.   Rehab Potential Good   Clinical Impairments Affecting Rehab Potential chronic HA, sedentary lifestyle   PT Frequency 2x / week   PT Duration 6 weeks   PT Treatment/Interventions ADLs/Self Care Home Management;Aquatic Therapy;Therapeutic exercise;Patient/family education;Dry needling;Manual techniques      Patient will benefit from skilled therapeutic intervention in order to improve the following deficits and impairments:  Decreased strength, Pain, Improper body mechanics, Postural dysfunction, Decreased endurance, Decreased range of motion, Hypomobility  Visit Diagnosis: Cervicalgia - Plan: PT plan  of care cert/re-cert  Stiffness of cervical spine - Plan: PT plan of care cert/re-cert  Muscle weakness (generalized) - Plan: PT plan of care cert/re-cert     Problem List Patient Active Problem List   Diagnosis Date Noted  . Headache 04/14/2016  . Neck pain 04/14/2016  . History of mammogram 03/02/2011  . Migraine syndrome   . Hx of colonic polyps   . GERD (gastroesophageal reflux disease) 12/16/2010    Fisher,Benjamin PT DPT 04/27/2016, 8:31 AM  [facility] [address], [place], [address] Phone: [phone]   Fax:  [phone]  Name: Stephanie Henderson MRN: [id number] Date of Birth: 04-27-1966

## 2016-05-03 ENCOUNTER — Ambulatory Visit: Payer: 59

## 2016-05-03 DIAGNOSIS — M436 Torticollis: Secondary | ICD-10-CM

## 2016-05-03 DIAGNOSIS — M6281 Muscle weakness (generalized): Secondary | ICD-10-CM | POA: Diagnosis not present

## 2016-05-03 DIAGNOSIS — M542 Cervicalgia: Secondary | ICD-10-CM

## 2016-05-03 NOTE — Therapy (Signed)
[facility] [address], [place], [address] Phone: [phone]   Fax:  [phone]  Physical Therapy Treatment  Patient Details  Name: Stephanie Henderson MRN: [id number] Date of Birth: 22-Sep-1965 Referring Provider: Derrel Nip  Encounter Date: 05/03/2016      PT End of Session - 05/03/16 1519    Visit Number 2   Number of Visits 13   Date for PT Re-Evaluation 06/08/16   PT Start Time 1520   PT Stop Time 1600   PT Time Calculation (min) 40 min   Activity Tolerance Patient tolerated treatment well   Behavior During Therapy [facility] for tasks assessed/performed      Past Medical History:  Diagnosis Date  . Fibroid   . GERD (gastroesophageal reflux disease) Aug 2012   with esophagitis by EGD  . Hematuria   . Hx of colonic polyps August 2012   repeat due 2016,  5 polyps 2009, clear 2012 Gustavo Lah)  . Menorrhagia    did not tolerate trial of ocps due to migraines  . Migraine headache   . Migraine syndrome    since age 21,  sporadic    Past Surgical History:  Procedure Laterality Date  . CYSTOSCOPY    . laparoscopy    . VAGINAL HYSTERECTOMY  6/13   with morcellation    There were no vitals filed for this visit.      Subjective Assessment - 05/03/16 1518    Subjective Pt reports that she had a regular migraine over the weekend. She states that yesterday she had a good day. States that today is not a good day. Pt reports that in the last week since she was here she developed L sided tooth pain, L jaw pain and L "ear" pain over the TMJ.    Pertinent History Pt has L sided neck pain, mild N/T on this side.   Patient Stated Goals Decr. HA, getting back to baseline of "feeling pretty good".    Currently in Pain? Yes   Pain Score 2    Pain Location Face   Pain Orientation Left   Pain Type Acute pain   Pain Onset In the past 7 days          Objective:  Manual therapy Palpation to lateral pterygoid  with increased pain compared to R side, trigger point and ischemic compression; Left 1st rib mobs grade III 3x1 min  Suboccipital release performed x 5 min; Manual traction with 20 second hold and 10 second release x 5 bouts; Supine cervical retractions 5 second hold x 5, 2 sets; L scalene stretch 30 second hold x 2; Prone STM including trigger point and ischemic compression to L upper trap, levator scapulae, and suboccipitals;   Following treatment pt reported no change in symptoms;                        PT Education - 05/03/16 1518    Education provided Yes   Education Details plan of care   Person(s) Educated Patient   Methods Explanation   Comprehension Verbalized understanding             PT Long Term Goals - 04/27/16 [id number]      PT LONG TERM GOAL #1   Title Pt will reduce number of HA to less than 3 per week   Baseline 5 per week   Time 6   Period Weeks   Status New  PT LONG TERM GOAL #2   Title Pt will improve cervical AROM to 85 deg. cervical rotation pain free to reduce difficulty with driving.   Baseline 70 B   Time 6   Period Weeks   Status New     PT LONG TERM GOAL #3   Title Pt will improve DNF strength to avoid compensation with cervical extensors in neutral posture to minimize stress on neck musculature   Baseline 3 sec. hold, goal is 8 sec. hold   Time 6   Period Weeks   Status New               Plan - 05/03/16 1519    Clinical Impression Statement Pt with increased tenderness with palpation to left lateral pterygoid compared to right but no notable change in pain with STM. Difficulty reproducing or relieving patient's posterior headache pain with manual therapy on this date. Pt encouraged to initiate cervical retractions for HEP. Pt encouraged to follow-up as scheduled.    Rehab Potential Good   Clinical Impairments Affecting Rehab Potential chronic HA, sedentary lifestyle   PT Frequency 2x / week   PT Duration 6  weeks   PT Treatment/Interventions ADLs/Self Care Home Management;Aquatic Therapy;Therapeutic exercise;Patient/family education;Dry needling;Manual techniques   PT Next Visit Plan continue with cervical manual techniques    PT Home Exercise Plan supine or seated cervical retractions x 10, sitting posture awareness      Patient will benefit from skilled therapeutic intervention in order to improve the following deficits and impairments:  Decreased strength, Pain, Improper body mechanics, Postural dysfunction, Decreased endurance, Decreased range of motion, Hypomobility  Visit Diagnosis: Cervicalgia  Stiffness of cervical spine     Problem List Patient Active Problem List   Diagnosis Date Noted  . Headache 04/14/2016  . Neck pain 04/14/2016  . History of mammogram 03/02/2011  . Migraine syndrome   . Hx of colonic polyps   . GERD (gastroesophageal reflux disease) 12/16/2010   Phillips Grout PT, DPT   Thresa Dozier 05/03/2016, 9:23 PM  [facility] [address] [place], [address] Phone: [phone]   Fax:  [phone]  Name: Stephanie Henderson MRN: [id number] Date of Birth: September 11, 1965

## 2016-05-06 ENCOUNTER — Ambulatory Visit: Payer: 59 | Admitting: Physical Therapy

## 2016-05-06 DIAGNOSIS — M542 Cervicalgia: Secondary | ICD-10-CM

## 2016-05-06 DIAGNOSIS — M436 Torticollis: Secondary | ICD-10-CM | POA: Diagnosis not present

## 2016-05-06 DIAGNOSIS — M6281 Muscle weakness (generalized): Secondary | ICD-10-CM | POA: Diagnosis not present

## 2016-05-06 NOTE — Therapy (Signed)
[place] [facility] [address] [place], [address] Phone: [phone]   Fax:  [phone]  Physical Therapy Treatment  Patient Details  Name: Stephanie Henderson MRN: [id number] Date of Birth: 05-09-1966 Referring Provider: Derrel Nip  Encounter Date: 05/06/2016      PT End of Session - 05/06/16 1622    Visit Number 3   Number of Visits 13   Date for PT Re-Evaluation 06/08/16   PT Start Time [id number]   PT Stop Time 1620   PT Time Calculation (min) 40 min   Activity Tolerance Patient tolerated treatment well   Behavior During Therapy [facility] for tasks assessed/performed      Past Medical History:  Diagnosis Date  . Fibroid   . GERD (gastroesophageal reflux disease) Aug 2012   with esophagitis by EGD  . Hematuria   . Hx of colonic polyps August 2012   repeat due 2016,  5 polyps 2009, clear 2012 Stephanie Henderson)  . Menorrhagia    did not tolerate trial of ocps due to migraines  . Migraine headache   . Migraine syndrome    since age 29,  sporadic    Past Surgical History:  Procedure Laterality Date  . CYSTOSCOPY    . laparoscopy    . VAGINAL HYSTERECTOMY  6/13   with morcellation    There were no vitals filed for this visit.      Subjective Assessment - 05/06/16 1621    Subjective Pt reports she is pain free currently. very mild TMJ pain.   Pertinent History Pt has L sided neck pain, mild N/T on this side.   Patient Stated Goals Decr. HA, getting back to baseline of "feeling pretty good".    Pain Onset In the past 7 days                  Objective: 1st rib mobs 3x30 grade III.  STM performed on L cervical spine generally x8 min   Suboccipital release x5 min  Traction 3x30 grade III oscillations.  Educated pt on and had pt perform SNAG for cervical rotation as self treatment in case her pain returns while on vacation.  Masseter release, temporalis release x3 min.  Pt reported no pain following  session. She felt less irritation with TMJ opening/closing following this.                    PT Long Term Goals - 04/27/16 [id number]      PT LONG TERM GOAL #1   Title Pt will reduce number of HA to less than 3 per week   Baseline 5 per week   Time 6   Period Weeks   Status New     PT LONG TERM GOAL #2   Title Pt will improve cervical AROM to 85 deg. cervical rotation pain free to reduce difficulty with driving.   Baseline 70 B   Time 6   Period Weeks   Status New     PT LONG TERM GOAL #3   Title Pt will improve DNF strength to avoid compensation with cervical extensors in neutral posture to minimize stress on neck musculature   Baseline 3 sec. hold, goal is 8 sec. hold   Time 6   Period Weeks   Status New               Plan - 05/06/16 1623    Clinical Impression Statement Notably decr. tenderness in pterygoid,  pt does have trigger points in L masseter, temporalis, upper trap. Pt will continue with retractions, no modification of HEP as pt is going skiing. PT educated pt on how to maintain pain free state while traveling including avoiding irritating behvaiors/overhead lifting.   Rehab Potential Good   Clinical Impairments Affecting Rehab Potential chronic HA, sedentary lifestyle   PT Frequency 2x / week   PT Duration 6 weeks   PT Treatment/Interventions ADLs/Self Care Home Management;Aquatic Therapy;Therapeutic exercise;Patient/family education;Dry needling;Manual techniques   PT Next Visit Plan continue with cervical manual techniques    PT Home Exercise Plan supine or seated cervical retractions x 10, sitting posture awareness      Patient will benefit from skilled therapeutic intervention in order to improve the following deficits and impairments:  Decreased strength, Pain, Improper body mechanics, Postural dysfunction, Decreased endurance, Decreased range of motion, Hypomobility  Visit Diagnosis: Cervicalgia     Problem List Patient Active  Problem List   Diagnosis Date Noted  . Headache 04/14/2016  . Neck pain 04/14/2016  . History of mammogram 03/02/2011  . Migraine syndrome   . Hx of colonic polyps   . GERD (gastroesophageal reflux disease) 12/16/2010    Stephanie Henderson PT DPT 05/06/2016, 4:25 PM  [place] [facility] [address], [place], [address] Phone: [phone]   Fax:  [phone]  Name: Stephanie Henderson MRN: [id number] Date of Birth: 12-01-1965

## 2016-05-13 ENCOUNTER — Encounter: Payer: 59 | Admitting: Physical Therapy

## 2016-05-18 ENCOUNTER — Ambulatory Visit: Payer: 59 | Attending: Internal Medicine | Admitting: Physical Therapy

## 2016-05-18 DIAGNOSIS — M436 Torticollis: Secondary | ICD-10-CM | POA: Insufficient documentation

## 2016-05-18 DIAGNOSIS — M542 Cervicalgia: Secondary | ICD-10-CM | POA: Diagnosis not present

## 2016-05-18 DIAGNOSIS — M6281 Muscle weakness (generalized): Secondary | ICD-10-CM | POA: Insufficient documentation

## 2016-05-18 NOTE — Therapy (Signed)
[place] [facility] [address] [place], [address] Phone: [phone]   Fax:  [phone]  Physical Therapy Treatment  Patient Details  Name: Stephanie Henderson MRN: [id number] Date of Birth: 05/23/1965 Referring Provider: Derrel Nip  Encounter Date: 05/18/2016      PT End of Session - 05/18/16 0817    Visit Number 4   Number of Visits 13   Date for PT Re-Evaluation 06/08/16   PT Start Time 0815   PT Stop Time 0840   PT Time Calculation (min) 25 min   Activity Tolerance Patient tolerated treatment well   Behavior During Therapy [facility] for tasks assessed/performed      Past Medical History:  Diagnosis Date  . Fibroid   . GERD (gastroesophageal reflux disease) Aug 2012   with esophagitis by EGD  . Hematuria   . Hx of colonic polyps August 2012   repeat due 2016,  5 polyps 2009, clear 2012 Gustavo Lah)  . Menorrhagia    did not tolerate trial of ocps due to migraines  . Migraine headache   . Migraine syndrome    since age 41,  sporadic    Past Surgical History:  Procedure Laterality Date  . CYSTOSCOPY    . laparoscopy    . VAGINAL HYSTERECTOMY  6/13   with morcellation    There were no vitals filed for this visit.      Subjective Assessment - 05/18/16 0814    Subjective Pt reports she is doing significantly better. She is having some neck pain related to her skiing.    Pertinent History Pt has L sided neck pain, mild N/T on this side.   Patient Stated Goals Decr. HA, getting back to baseline of "feeling pretty good".    Currently in Pain? Yes   Pain Score 2    Pain Location Neck   Pain Orientation Left   Pain Onset In the past 7 days             Objective: 8 min STM of L levator scapula and L masseter using effleurage, cross friction massage, compression. Initially tender with masseter, improved with treatment.  Lateral glide L TMJ performed externally, 3x30 sec., grade II   Gentle sustained  TMJ distraction focusing on L side, x2 min total.  6 min suboccipital release. Pt had difficulty relaxing, PT cued and pt able to relax shoulders.  3 bouts 30-45 sec L first rib mobs grade III, Mobility WNL  2 min stretch of L levator scapula.   Pain with R lateral neck flexion was noticeably decreased after treatment.                     PT Education - 05/18/16 0817    Education provided Yes   Education Details DNF strengthening   Person(s) Educated Patient   Methods Explanation   Comprehension Verbalized understanding             PT Long Term Goals - 04/27/16 0822      PT LONG TERM GOAL #1   Title Pt will reduce number of HA to less than 3 per week   Baseline 5 per week   Time 6   Period Weeks   Status New     PT LONG TERM GOAL #2   Title Pt will improve cervical AROM to 85 deg. cervical rotation pain free to reduce difficulty with driving.   Baseline 70 B   Time 6  Period Weeks   Status New     PT LONG TERM GOAL #3   Title Pt will improve DNF strength to avoid compensation with cervical extensors in neutral posture to minimize stress on neck musculature   Baseline 3 sec. hold, goal is 8 sec. hold   Time 6   Period Weeks   Status New               Plan - 05/18/16 [id number]    Clinical Impression Statement Pt presented with tenderness in L masseter. While skiing during vacation pt noticed pain and trigger point in L levator scapula. Pt notes continued improvement in symptoms, especially related to headaches. Pt apprehensive about dry needling as a treatment option for masseter and pterygoid trigger points.     Rehab Potential Good   Clinical Impairments Affecting Rehab Potential chronic HA, sedentary lifestyle   PT Frequency 2x / week   PT Duration 6 weeks   PT Treatment/Interventions ADLs/Self Care Home Management;Aquatic Therapy;Therapeutic exercise;Patient/family education;Dry needling;Manual techniques   PT Next Visit Plan continue with  cervical manual techniques    PT Home Exercise Plan supine or seated cervical retractions x 10, sitting posture awareness      Patient will benefit from skilled therapeutic intervention in order to improve the following deficits and impairments:  Decreased strength, Pain, Improper body mechanics, Postural dysfunction, Decreased endurance, Decreased range of motion, Hypomobility  Visit Diagnosis: Cervicalgia     Problem List Patient Active Problem List   Diagnosis Date Noted  . Headache 04/14/2016  . Neck pain 04/14/2016  . History of mammogram 03/02/2011  . Migraine syndrome   . Hx of colonic polyps   . GERD (gastroesophageal reflux disease) 12/16/2010    Fisher,Benjamin PT DPT 05/18/2016, 9:56 AM  [place] [facility] [address] [place], [address] Phone: [phone]   Fax:  [phone]  Name: Stephanie Henderson MRN: [id number] Date of Birth: Mar 06, 1966

## 2016-05-20 ENCOUNTER — Ambulatory Visit: Payer: 59 | Admitting: Physical Therapy

## 2016-05-20 DIAGNOSIS — M6281 Muscle weakness (generalized): Secondary | ICD-10-CM | POA: Diagnosis not present

## 2016-05-20 DIAGNOSIS — M542 Cervicalgia: Secondary | ICD-10-CM | POA: Diagnosis not present

## 2016-05-20 DIAGNOSIS — M436 Torticollis: Secondary | ICD-10-CM

## 2016-05-20 NOTE — Therapy (Signed)
[place] [facility] [address] [place], [address] Phone: [phone]   Fax:  [phone]  Physical Therapy Treatment  Patient Details  Name: Stephanie Henderson MRN: [id number] Date of Birth: [birth year] Referring Provider: Derrel Nip  Encounter Date: 05/20/2016      PT End of Session - 05/20/16 1607    Visit Number 5   Number of Visits 13   Date for PT Re-Evaluation 06/08/16   PT Start Time [id number]   PT Stop Time 1600   PT Time Calculation (min) 29 min   Activity Tolerance Patient tolerated treatment well   Behavior During Therapy [facility] for tasks assessed/performed      Past Medical History:  Diagnosis Date  . Fibroid   . GERD (gastroesophageal reflux disease) Aug 2012   with esophagitis by EGD  . Hematuria   . Hx of colonic polyps August 2012   repeat due 2016,  5 polyps 2009, clear 2012 Gustavo Lah)  . Menorrhagia    did not tolerate trial of ocps due to migraines  . Migraine headache   . Migraine syndrome    since age 24,  sporadic    Past Surgical History:  Procedure Laterality Date  . CYSTOSCOPY    . laparoscopy    . VAGINAL HYSTERECTOMY  6/13   with morcellation    There were no vitals filed for this visit.      Subjective Assessment - 05/20/16 1536    Subjective Pt reports her neck stiffness has not changed since last session, but is better than when she went skiing. Pt reports feeling "knots" in her neck musculature.    Pertinent History Pt has L sided neck pain, mild N/T on this side.   Patient Stated Goals Decr. HA, getting back to baseline of "feeling pretty good".    Currently in Pain? Yes   Pain Location Neck   Pain Orientation Left   Pain Descriptors / Indicators Tightness   Pain Type Acute pain   Pain Onset In the past 7 days         Objective:  supine 14 minutes of STM on L levator scapula and L peri-cervical musculature. Pt. Noted less discomfort with active rotation after this  treatment, incr. ROM.  Pt performed scapular retraction x15 with cervical retraction using a red theraband for resistance. Tactile cuing of her scapulas and verbal cuing as well as demonstration was performed. She was given a red theraband and told to perform these exercises at home to strengthen her upper traps.  Pt also performed 2x10 bilateral over head shoulder press with 5 pounds of resistance in each hand and educated to do this exercise at home to strengthen her upper traps. Verbal cuing and demonstration were performed to correct posture during exercise. She stated she had 5 pound weights at home.  Pt was also educated on a self snag technique using demonstration to perform on her levator scapula. She performed this 5 times and was told to do it 5 times ever 2-3 hours until next visit to relax hypertonic musculature                       PT Education - 05/20/16 1542    Education provided Yes   Education Details DNF and scapular strengthening   Person(s) Educated Patient   Methods Explanation;Demonstration   Comprehension Verbalized understanding;Returned demonstration             PT Long  Term Goals - 04/27/16 [id number]      PT LONG TERM GOAL #1   Title Pt will reduce number of HA to less than 3 per week   Baseline 5 per week   Time 6   Period Weeks   Status New     PT LONG TERM GOAL #2   Title Pt will improve cervical AROM to 85 deg. cervical rotation pain free to reduce difficulty with driving.   Baseline 70 B   Time 6   Period Weeks   Status New     PT LONG TERM GOAL #3   Title Pt will improve DNF strength to avoid compensation with cervical extensors in neutral posture to minimize stress on neck musculature   Baseline 3 sec. hold, goal is 8 sec. hold   Time 6   Period Weeks   Status New               Plan - 05/20/16 1608    Clinical Impression Statement Pt notes that she is still experiencing the tenderness of her left levatot scapula  and says she feels it catch when turning her head to the left. She said it is very manageable and often forgets about it until she does turn her head. She states that her other symptoms have continued to improve.   Rehab Potential Good   Clinical Impairments Affecting Rehab Potential chronic HA, sedentary lifestyle   PT Frequency 2x / week   PT Duration 6 weeks   PT Treatment/Interventions ADLs/Self Care Home Management;Aquatic Therapy;Therapeutic exercise;Patient/family education;Dry needling;Manual techniques   PT Next Visit Plan continue with cervical manual techniques    PT Home Exercise Plan supine or seated cervical retractions x 10, sitting posture awareness      Patient will benefit from skilled therapeutic intervention in order to improve the following deficits and impairments:  Decreased strength, Pain, Improper body mechanics, Postural dysfunction, Decreased endurance, Decreased range of motion, Hypomobility  Visit Diagnosis: No diagnosis found.     Problem List Patient Active Problem List   Diagnosis Date Noted  . Headache 04/14/2016  . Neck pain 04/14/2016  . History of mammogram 03/02/2011  . Migraine syndrome   . Hx of colonic polyps   . GERD (gastroesophageal reflux disease) 12/16/2010    Karle Desrosier PT DPT 05/20/2016, 4:13 PM  [place] [facility] [address] [place], [address] Phone: [phone]   Fax:  [phone]  Name: Stephanie Henderson MRN: [id number] Date of Birth: 18-Dec-1965

## 2016-05-24 ENCOUNTER — Ambulatory Visit: Payer: 59 | Admitting: Physical Therapy

## 2016-05-24 ENCOUNTER — Encounter: Payer: Self-pay | Admitting: Physical Therapy

## 2016-05-24 DIAGNOSIS — M436 Torticollis: Secondary | ICD-10-CM | POA: Diagnosis not present

## 2016-05-24 DIAGNOSIS — M6281 Muscle weakness (generalized): Secondary | ICD-10-CM | POA: Diagnosis not present

## 2016-05-24 DIAGNOSIS — M542 Cervicalgia: Secondary | ICD-10-CM | POA: Diagnosis not present

## 2016-05-24 DIAGNOSIS — K219 Gastro-esophageal reflux disease without esophagitis: Secondary | ICD-10-CM | POA: Diagnosis not present

## 2016-05-24 DIAGNOSIS — Z800 Family history of malignant neoplasm of digestive organs: Secondary | ICD-10-CM | POA: Diagnosis not present

## 2016-05-24 NOTE — Therapy (Signed)
[facility] [address] [place], [address] Phone: [phone]   Fax:  [phone]  Physical Therapy Treatment  Patient Details  Name: Stephanie Henderson MRN: [id number] Date of Birth: 10/02/1965 Referring Provider: Derrel Nip  Encounter Date: 05/24/2016      PT End of Session - 05/24/16 1658    Visit Number 6   Number of Visits 13   Date for PT Re-Evaluation 06/08/16   PT Start Time [id number]   PT Stop Time 1703   PT Time Calculation (min) 25 min   Activity Tolerance Patient tolerated treatment well;No increased pain   Behavior During Therapy WFL for tasks assessed/performed      Past Medical History:  Diagnosis Date  . Fibroid   . GERD (gastroesophageal reflux disease) Aug 2012   with esophagitis by EGD  . Hematuria   . Hx of colonic polyps August 2012   repeat due 2016,  5 polyps 2009, clear 2012 Gustavo Lah)  . Menorrhagia    did not tolerate trial of ocps due to migraines  . Migraine headache   . Migraine syndrome    since age 43,  sporadic    Past Surgical History:  Procedure Laterality Date  . CYSTOSCOPY    . laparoscopy    . VAGINAL HYSTERECTOMY  6/13   with morcellation    There were no vitals filed for this visit.      Subjective Assessment - 05/24/16 1641    Subjective Pt states that she is doing well and denies pain in her jaw and states that she only has headaches "very rarely." She does state that the "knots" in her neck still bother her however it comes and goes.   Pertinent History Pt has L sided neck pain, mild N/T on this side.   Patient Stated Goals Decr. HA, getting back to baseline of "feeling pretty good".    Pain Onset In the past 7 days       Reviewed and advanced HEP. Progressed standing scapular retraction to GTB and provided tactile cuing over scapulas. Added deep neck flexor holds in supine 5 x 10 seconds with verbal cuing and demonstration. Pt returned demonstration and  verbalized understanding.   Completed NDI with score of 16%, 8% of which was due to heavy lifting component which pt states she has "never" been able to perform.   Pt was educated on possibility of future episodes due to over use and period of stress. Pt understood that she should quickly treat these future incidences with techniques learned throughout therapy. Pt verbalized understanding of transition to home maintenance and understood steps to contact PT if necessary.                           PT Education - 05/24/16 1650    Education provided Yes   Education Details HEP   Person(s) Educated Patient   Methods Explanation;Demonstration   Comprehension Verbalized understanding             PT Long Term Goals - 05/24/16 1707      PT LONG TERM GOAL #1   Title Pt will reduce number of HA to less than 3 per week   Baseline 5 per week   Time 6   Period Weeks   Status Achieved     PT LONG TERM GOAL #2   Title Pt will improve cervical AROM to 85 deg. cervical rotation pain  free to reduce difficulty with driving.   Baseline 70 B   Time 6   Period Weeks   Status Achieved     PT LONG TERM GOAL #3   Title Pt will improve DNF strength to avoid compensation with cervical extensors in neutral posture to minimize stress on neck musculature   Baseline 3 sec. hold, goal is 8 sec. hold   Time 6   Period Weeks   Status Achieved               Plan - 05/24/16 1652    Clinical Impression Statement At this time pt appears to be appropriate for d/c. Her HA are now primarily in remission, any remaining cervical pain is related to new onset pain from skiing. Pt notes that her L posterior cervical musculature is still tender and notes that it comes and goes. She states that it is manageable. She denies any jaw pain and states that he headaches are 95% better. PT encouraged pt to continue with HEP to maintain improvements in pain, ROM, and to touch base in 3 weeks prior  to d/c.   Rehab Potential Good   Clinical Impairments Affecting Rehab Potential chronic HA, sedentary lifestyle   PT Frequency 2x / week   PT Duration 6 weeks   PT Treatment/Interventions ADLs/Self Care Home Management;Aquatic Therapy;Therapeutic exercise;Patient/family education;Dry needling;Manual techniques   PT Next Visit Plan continue with cervical manual techniques    PT Home Exercise Plan supine or seated cervical retractions x 10, sitting posture awareness      Patient will benefit from skilled therapeutic intervention in order to improve the following deficits and impairments:  Decreased strength, Pain, Improper body mechanics, Postural dysfunction, Decreased endurance, Decreased range of motion, Hypomobility  Visit Diagnosis: Stiffness of cervical spine  Muscle weakness (generalized)     Problem List Patient Active Problem List   Diagnosis Date Noted  . Headache 04/14/2016  . Neck pain 04/14/2016  . History of mammogram 03/02/2011  . Migraine syndrome   . Hx of colonic polyps   . GERD (gastroesophageal reflux disease) 12/16/2010    Fisher,Benjamin PT DPT 05/24/2016, 5:30 PM  [facility] [address], [place], [address] Phone: [phone]   Fax:  [phone]  Name: Stephanie Henderson MRN: [id number] Date of Birth: 1965/07/22

## 2016-05-27 ENCOUNTER — Encounter: Payer: 59 | Admitting: Physical Therapy

## 2016-05-27 ENCOUNTER — Encounter: Payer: Self-pay | Admitting: Internal Medicine

## 2016-05-27 ENCOUNTER — Ambulatory Visit (INDEPENDENT_AMBULATORY_CARE_PROVIDER_SITE_OTHER): Payer: 59 | Admitting: Internal Medicine

## 2016-05-27 VITALS — BP 126/76 | HR 90 | Temp 98.3°F | Resp 16 | Ht 62.5 in | Wt 113.6 lb

## 2016-05-27 DIAGNOSIS — Z90711 Acquired absence of uterus with remaining cervical stump: Secondary | ICD-10-CM | POA: Diagnosis not present

## 2016-05-27 DIAGNOSIS — R002 Palpitations: Secondary | ICD-10-CM

## 2016-05-27 DIAGNOSIS — F411 Generalized anxiety disorder: Secondary | ICD-10-CM | POA: Diagnosis not present

## 2016-05-27 DIAGNOSIS — K2100 Gastro-esophageal reflux disease with esophagitis, without bleeding: Secondary | ICD-10-CM

## 2016-05-27 DIAGNOSIS — R51 Headache: Secondary | ICD-10-CM | POA: Diagnosis not present

## 2016-05-27 DIAGNOSIS — R519 Headache, unspecified: Secondary | ICD-10-CM

## 2016-05-27 LAB — CBC WITH DIFFERENTIAL/PLATELET
BASOS ABS: 0 10*3/uL (ref 0.0–0.1)
Basophils Relative: 0.6 % (ref 0.0–3.0)
Eosinophils Absolute: 0.1 10*3/uL (ref 0.0–0.7)
Eosinophils Relative: 2 % (ref 0.0–5.0)
HEMATOCRIT: 39.7 % (ref 36.0–46.0)
Hemoglobin: 13.1 g/dL (ref 12.0–15.0)
LYMPHS PCT: 35.3 % (ref 12.0–46.0)
Lymphs Abs: 2.6 10*3/uL (ref 0.7–4.0)
MCHC: 33.1 g/dL (ref 30.0–36.0)
MCV: 96.8 fl (ref 78.0–100.0)
Monocytes Absolute: 0.3 10*3/uL (ref 0.1–1.0)
Monocytes Relative: 4.3 % (ref 3.0–12.0)
NEUTROS PCT: 57.8 % (ref 43.0–77.0)
Neutro Abs: 4.2 10*3/uL (ref 1.4–7.7)
Platelets: 248 10*3/uL (ref 150.0–400.0)
RBC: 4.1 Mil/uL (ref 3.87–5.11)
RDW: 13.9 % (ref 11.5–15.5)
WBC: 7.3 10*3/uL (ref 4.0–10.5)

## 2016-05-27 LAB — TSH: TSH: 1.69 u[IU]/mL (ref 0.35–4.50)

## 2016-05-27 MED ORDER — SERTRALINE HCL 50 MG PO TABS: 50.0000 mg | ORAL_TABLET | Freq: Every day | ORAL | 1 refills | Status: DC

## 2016-05-27 MED ORDER — CYCLOBENZAPRINE HCL 10 MG PO TABS: 10.0000 mg | ORAL_TABLET | Freq: Every day | ORAL | 0 refills | Status: DC

## 2016-05-27 NOTE — Progress Notes (Signed)
Pre-visit discussion using our clinic review tool. No additional management support is needed unless otherwise documented below in the visit note.  

## 2016-05-27 NOTE — Patient Instructions (Addendum)
I WOULD LIKE YOU TO TRY GENERIC ZOLOFT (because I think your symptoms of muscle tension and obsessiveness are coming from Generalized Anxietyy Disorder)   SSRI and exercise will help (see below) .  Start with 1/2 tablet (25 mg) daily after dinner.  Increase every 2 weeks as tolerated up to 100 mg .  You can use the muscle relaxer if needed to help you sleep   Regarding your A1c and reduction to 5.3;     You might want to try a premixed protein drink called Premier Protein shake for breakfast or late night snack . It is great tasting,   very low sugar and available of < $2 serving at [facility] and  In bulk for $1.50/serving at [employer] and [employer]  .    Nutritional analysis :  160 cal  30 g protein  1 g sugar 50% calcium needs   Vladimir Faster and [employer]   Also  Try the KIND low glycemic index bars instead of cookies. THEY HAVE ONLY 5 G SUGAR,  NO SUGAR ALCOHOLS   Generalized Anxiety Disorder Generalized anxiety disorder (GAD) is a mental disorder. It interferes with life functions, including relationships, work, and school. GAD is different from normal anxiety, which everyone experiences at some point in their lives in response to specific life events and activities. Normal anxiety actually helps [place] prepare for and get through these life events and activities. Normal anxiety goes away after the event or activity is over.  GAD causes anxiety that is not necessarily related to specific events or activities. It also causes excess anxiety in proportion to specific events or activities. The anxiety associated with GAD is also difficult to control. GAD can vary from mild to severe. People with severe GAD can have intense waves of anxiety with physical symptoms (panic attacks).  SYMPTOMS The anxiety and worry associated with GAD are difficult to control. This anxiety and worry are related to many life events and activities and also occur more days than not for 6 months or longer. People with GAD also have three or  more of the following symptoms (one or more in children):  Restlessness.   Fatigue.  Difficulty concentrating.   Irritability.  Muscle tension.  Difficulty sleeping or unsatisfying sleep. DIAGNOSIS GAD is diagnosed through an assessment by your health care provider. Your health care provider will ask you questions aboutyour mood,physical symptoms, and events in your life. Your health care provider may ask you about your medical history and use of alcohol or drugs, including prescription medicines. Your health care provider may also do a physical exam and blood tests. Certain medical conditions and the use of certain substances can cause symptoms similar to those associated with GAD. Your health care provider may refer you to a mental health specialist for further evaluation. TREATMENT The following therapies are usually used to treat GAD:   Medication. Antidepressant medication usually is prescribed for long-term daily control. Antianxiety medicines may be added in severe cases, especially when panic attacks occur.   Talk therapy (psychotherapy). Certain types of talk therapy can be helpful in treating GAD by providing support, education, and guidance. A form of talk therapy called cognitive behavioral therapy can teach you healthy ways to think about and react to daily life events and activities.  Stress managementtechniques. These include yoga, meditation, and exercise and can be very helpful when they are practiced regularly. A mental health specialist can help determine which treatment is best for you. Some people see improvement  with one therapy. However, other people require a combination of therapies. This information is not intended to replace advice given to you by your health care provider. Make sure you discuss any questions you have with your health care provider. Document Released: 08/28/2012 Document Revised: 05/24/2014 Document Reviewed: 08/28/2012 Elsevier Interactive  Patient Education  2017 [employer].

## 2016-05-27 NOTE — Progress Notes (Signed)
   Review of Systems  Objective:   Physical Exam

## 2016-05-28 ENCOUNTER — Encounter: Payer: Self-pay | Admitting: Internal Medicine

## 2016-05-29 DIAGNOSIS — Z90710 Acquired absence of both cervix and uterus: Secondary | ICD-10-CM | POA: Insufficient documentation

## 2016-05-29 DIAGNOSIS — F411 Generalized anxiety disorder: Secondary | ICD-10-CM | POA: Insufficient documentation

## 2016-05-29 NOTE — Assessment & Plan Note (Signed)
Annual pelvic exams and periodic PAP smear are done by Dr Enzo Bi

## 2016-05-29 NOTE — Assessment & Plan Note (Signed)
Suggestive of muscle tension.  Trial of muscle relaxera= at bedtie

## 2016-05-29 NOTE — Assessment & Plan Note (Signed)
With obsessive /compulsive personality traits and family histoyr of OCD in father Recommended trial of zoloft

## 2016-05-29 NOTE — Assessment & Plan Note (Signed)
With prior EGD in 2012  Showing esophagitis, continue daily PPI and prn carafate and follow up with GI annually,

## 2016-05-29 NOTE — Progress Notes (Signed)
Subjective:  Patient ID: Stephanie Henderson, female    DOB: 1965-07-24  Age: 51 y.o. MRN: [id number]  CC: The primary encounter diagnosis was Palpitations. Diagnoses of Generalized anxiety disorder, Acute nonintractable headache, unspecified headache type, S/P laparoscopic supracervical hysterectomy, and Gastroesophageal reflux disease with esophagitis were also pertinent to this visit.  HPI Stephanie Henderson presents for  RESTABLISHMENT OF CARE management of  chronic and acute problems.   Has NOT BEEN SEEN SINCE DEC 2013 !   e History of GERD: TAKING PROTONIX : had an EGD in 2012;  no Barrett's or ulcer.  Using prn CARAFATE OFFERED BY [facility] FOR PRN USE   HISTORY OF COLON CA IN FATHER,  DUE for 5 yr follow up  In 2020 LAST ONE 2015   S/P supracervical hysterectomy , Dr. Enzo Henderson does annual breast and pelvic exams.   HAD MAMMOGRAM and subsequent BIOPSY OF right breast, which was benign fibrocystic changes in Oct/NOV  Cc: neck pain , headaches. Started having recurrence of migraine headaches accompanied  by left sided neck pain and dizziness in October, panicky feelings and  racing heart.  Saw Dr Stephanie Henderson,  Given prednisone 12 day course,  No change in headache . Started PT for cervicalgia 3 weeks  Ago with mild  improvement,  Tension in shoulders and jaw improved transeintly but started to return a few days ago.  Last PT session was Monday .   .   "i'M A RUMINATOR"  FATHER HAD OCD AND SHE reports personality traits suggestive of OCP  History Stephanie Henderson has a past medical history of Fibroid; GERD (gastroesophageal reflux disease) (Aug 2012); Hematuria; colonic polyps (August 2012); Menorrhagia; Migraine headache; and Migraine syndrome.   She has a past surgical history that includes laparoscopy; Cystoscopy; and Vaginal hysterectomy (6/13).   Her family history includes Cancer (age of onset: 49) in her father; Colon cancer in her father; Depression in her father; Hyperlipidemia in her mother; Kidney  disease in her daughter; Mental illness in her father and mother.She reports that she has never smoked. She has never used smokeless tobacco. She reports that she drinks about 2.4 - 3.0 oz of alcohol per week . She reports that she does not use drugs.  Outpatient Medications Prior to Visit  Medication Sig Dispense Refill  . pantoprazole (PROTONIX) 40 MG tablet Take 40 mg by mouth daily.      . sucralfate (CARAFATE) 1 G tablet Take 1 g by mouth 3 (three) times daily as needed.      . SUMAtriptan (IMITREX) 100 MG tablet Take 1 tablet (100 mg total) by mouth every 2 (two) hours as needed for migraine. May repeat in 2 hours if needed 10 tablet 2  . predniSONE (STERAPRED UNI-PAK 48 TAB) 10 MG (48) TBPK tablet Per package instructions. 48 tablet 0   No facility-administered medications prior to visit.     Review of Systems:  Patient denies headache, fevers, malaise, unintentional weight loss, skin rash, eye pain, sinus congestion and sinus pain, sore throat, dysphagia,  hemoptysis , cough, dyspnea, wheezing, chest pain, palpitations, orthopnea, edema, abdominal pain, nausea, melena, diarrhea, constipation, flank pain, dysuria, hematuria, urinary  Frequency, nocturia, numbness, tingling, seizures,  Focal weakness, Loss of consciousness,  Tremor, insomnia, depression, anxiety, and suicidal ideation.      Patient denies , fevers, malaise, unintentional weight loss, skin rash, eye pain, sinus congestion and sinus pain, sore throat, dysphagia,  hemoptysis , cough, dyspnea, wheezing, chest pain,orthopnea, edema, abdominal pain, nausea, melena, diarrhea,  constipation, flank pain, dysuria, hematuria, urinary  Frequency, nocturia, numbness, tingling, seizures,  Focal weakness, Loss of consciousness,  Tremor, , depression, , and suicidal ideation.     Objective:  BP 126/76   Pulse 90   Temp 98.3 F (36.8 C) (Oral)   Resp 16   Ht 5' 2.5" (1.588 m)   Wt 113 lb 9.6 oz (51.5 kg)   LMP 02/20/2011   SpO2 98%    BMI 20.45 kg/m   Physical Exam:  General appearance: alert, cooperative and appears stated age Ears: normal TM's and external ear canals both ears Throat: lips, mucosa, and tongue normal; teeth and gums normal Neck: no adenopathy, no carotid bruit, supple, symmetrical, trachea midline and thyroid not enlarged, symmetric, no tenderness/mass/nodules Back: symmetric, no curvature. ROM normal. No CVA tenderness. Lungs: clear to auscultation bilaterally Heart: regular rate and rhythm, S1, S2 normal, no murmur, click, rub or gallop Abdomen: soft, non-tender; bowel sounds normal; no masses,  no organomegaly Pulses: 2+ and symmetric Skin: Skin color, texture, turgor normal. No rashes or lesions Lymph nodes: Cervical, supraclavicular, and axillary nodes normal.   Assessment & Plan:   Problem List Items Addressed This Visit    Generalized anxiety disorder    With obsessive /compulsive personality traits and family histoyr of OCD in father Recommended trial of zoloft       GERD (gastroesophageal reflux disease)    With prior EGD in 2012  Showing esophagitis, continue daily PPI and prn carafate and follow up with GI annually,       Headache    Suggestive of muscle tension.  Trial of muscle relaxera= at bedtie       Relevant Medications   sertraline (ZOLOFT) 50 MG tablet   cyclobenzaprine (FLEXERIL) 10 MG tablet   S/P laparoscopic supracervical hysterectomy    Annual pelvic exams and periodic PAP smear are done by Dr Stephanie Henderson        Other Visit Diagnoses    Palpitations    -  Primary   Relevant Orders   CBC with Differential/Platelet (Completed)   TSH (Completed)     A total of 44 minutes was spent with patient more than half of which was spent in counseling patient on the above mentioned issues , reviewing and explaining recent labs and imaging studies done, and coordination of care. I have discontinued Stephanie Henderson predniSONE. I am also having her start on sertraline and  cyclobenzaprine. Additionally, I am having her maintain her pantoprazole, sucralfate, and SUMAtriptan.  Meds ordered this encounter  Medications  . sertraline (ZOLOFT) 50 MG tablet    Sig: Take 1 tablet (50 mg total) by mouth daily.    Dispense:  90 tablet    Refill:  1  . cyclobenzaprine (FLEXERIL) 10 MG tablet    Sig: Take 1 tablet (10 mg total) by mouth at bedtime.    Dispense:  30 tablet    Refill:  0    Medications Discontinued During This Encounter  Medication Reason  . predniSONE (STERAPRED UNI-PAK 48 TAB) 10 MG (48) TBPK tablet Completed Course    Follow-up: No Follow-up on file.   Crecencio Mc, MD

## 2016-07-13 ENCOUNTER — Encounter: Payer: Self-pay | Admitting: Internal Medicine

## 2016-08-04 ENCOUNTER — Ambulatory Visit: Payer: Self-pay | Admitting: Physician Assistant

## 2016-08-04 VITALS — BP 100/70 | HR 88 | Temp 98.8°F

## 2016-08-04 DIAGNOSIS — J0100 Acute maxillary sinusitis, unspecified: Secondary | ICD-10-CM

## 2016-08-04 MED ORDER — CLARITHROMYCIN 500 MG PO TABS: 500.0000 mg | ORAL_TABLET | Freq: Two times a day (BID) | ORAL | 0 refills | Status: DC

## 2016-08-04 MED ORDER — CLARITHROMYCIN 250 MG PO TABS: 500.0000 mg | ORAL_TABLET | Freq: Two times a day (BID) | ORAL | Status: DC

## 2016-08-04 NOTE — Progress Notes (Signed)
S:  10 days of sinus congestion and pressure.  No fever. Hx of sinus infections in past. O:  Tms dull bilat, nose swollen, throat post drainage.  Moderate tender left max sinus.  Neck supple without aden.  Lungs       Clear bilat. A:   Acute Max. Sinusitis P:  Biaxin 500mg  I bid #20

## 2016-08-16 ENCOUNTER — Encounter: Payer: Self-pay | Admitting: Internal Medicine

## 2016-08-16 ENCOUNTER — Ambulatory Visit (INDEPENDENT_AMBULATORY_CARE_PROVIDER_SITE_OTHER): Payer: 59 | Admitting: Internal Medicine

## 2016-08-16 DIAGNOSIS — F411 Generalized anxiety disorder: Secondary | ICD-10-CM | POA: Diagnosis not present

## 2016-08-16 MED ORDER — ALPRAZOLAM 0.5 MG PO TABS: 0.5000 mg | ORAL_TABLET | Freq: Two times a day (BID) | ORAL | 0 refills | Status: DC | PRN

## 2016-08-16 MED ORDER — SUMATRIPTAN SUCCINATE 100 MG PO TABS: 100.0000 mg | ORAL_TABLET | Freq: Once | ORAL | 5 refills | Status: DC

## 2016-08-16 NOTE — Patient Instructions (Signed)
Trial of alprazolam for insomnia.  You can start with 1/2 tablet and repeat dose after 30 minutes if needed  If you feel you need a daytime medication,  We'll try Paxil next time.

## 2016-08-16 NOTE — Progress Notes (Signed)
Subjective:  Patient ID: Stephanie Henderson, female    DOB: 09/12/1965  Age: 51 y.o. MRN: [id number]  CC: The encounter diagnosis was Generalized anxiety disorder.  HPI [name] presents for follow up on anxiety  Did not tolerate zoloft , which was chosen because of mild obsessive symptoms.  The medication made her "jumpy " at night in bed ,  hyperreflexive,  And she developed excessive mucosal dryness  so her husband asked to her stop it.  patient self tapered over 4 weeks  Did ok on 25 mg daily,  But increase to 50 mg was not tolerated.   Not sleeping well,  Clenching teeth   Outpatient Medications Prior to Visit  Medication Sig Dispense Refill  . clarithromycin (BIAXIN) 500 MG tablet Take 1 tablet (500 mg total) by mouth 2 (two) times daily. (Patient not taking: Reported on 08/16/2016) 20 tablet 0  . cyclobenzaprine (FLEXERIL) 10 MG tablet Take 1 tablet (10 mg total) by mouth at bedtime. 30 tablet 0  . pantoprazole (PROTONIX) 40 MG tablet Take 40 mg by mouth daily.      . sertraline (ZOLOFT) 50 MG tablet Take 1 tablet (50 mg total) by mouth daily. (Patient not taking: Reported on 08/16/2016) 90 tablet 1  . sucralfate (CARAFATE) 1 G tablet Take 1 g by mouth 3 (three) times daily as needed.      . SUMAtriptan (IMITREX) 100 MG tablet Take 1 tablet (100 mg total) by mouth every 2 (two) hours as needed for migraine. May repeat in 2 hours if needed 10 tablet 2   No facility-administered medications prior to visit.     Review of Systems;  Patient denies headache, fevers, malaise, unintentional weight loss, skin rash, eye pain, sinus congestion and sinus pain, sore throat, dysphagia,  hemoptysis , cough, dyspnea, wheezing, chest pain, palpitations, orthopnea, edema, abdominal pain, nausea, melena, diarrhea, constipation, flank pain, dysuria, hematuria, urinary  Frequency, nocturia, numbness, tingling, seizures,  Focal weakness, Loss of consciousness,  Tremor, insomnia, depression, anxiety, and  suicidal ideation.      Objective:  BP 100/64 (BP Location: Left Arm, Patient Position: Sitting, Cuff Size: Normal)   Pulse 82   Temp 98.5 F (36.9 C) (Oral)   Resp 16   Wt 116 lb 2 oz (52.7 kg)   LMP 02/20/2011   SpO2 99%   BMI 20.90 kg/m   BP Readings from Last 3 Encounters:  08/16/16 100/64  08/04/16 100/70  05/27/16 126/76    Wt Readings from Last 3 Encounters:  08/16/16 116 lb 2 oz (52.7 kg)  05/27/16 113 lb 9.6 oz (51.5 kg)  04/14/16 112 lb 2 oz (50.9 kg)    General appearance: alert, cooperative and appears stated age Neck: no adenopathy, no carotid bruit, supple, symmetrical, trachea midline and thyroid not enlarged, symmetric, no tenderness/mass/nodules Back: symmetric, no curvature. ROM normal. No CVA tenderness. Lungs: clear to auscultation bilaterally Heart: regular rate and rhythm, S1, S2 normal, no murmur, click, rub or gallop Abdomen: soft, non-tender; bowel sounds normal; no masses,  no organomegaly Pulses: 2+ and symmetric Skin: Skin color, texture, turgor normal. No rashes or lesions Lymph nodes: Cervical, supraclavicular, and axillary nodes normal.  Lab Results  Component Value Date   HGBA1C 5.3 01/14/2016   HGBA1C 5.8 (H) 01/14/2015    Lab Results  Component Value Date   CREATININE 0.57 01/14/2015    Lab Results  Component Value Date   WBC 7.3 05/27/2016   HGB 13.1 05/27/2016  HCT 39.7 05/27/2016   PLT 248.0 05/27/2016   GLUCOSE 80 01/14/2016   CHOL 170 01/14/2016   TRIG 39 01/14/2016   HDL 56 01/14/2016   LDLCALC 106 (H) 01/14/2016   ALT 21 01/14/2015   AST 19 01/14/2015   NA 137 01/14/2015   K 4.4 01/14/2015   CL 98 01/14/2015   CREATININE 0.57 01/14/2015   BUN 14 01/14/2015   CO2 25 01/14/2015   TSH 1.69 05/27/2016   HGBA1C 5.3 01/14/2016    Assessment & Plan:   Problem List Items Addressed This Visit    Generalized anxiety disorder    Did not tolerate zoloft.  Will consider paxil if needed.  Discussed natural  remedies for insomnia including herbal tea and melatonin, which she has tried.  Reviewed principles of good sleep hygiene. The risks and benefits of benzodiazepine use were discussed with patient today including excessive sedation leading to respiratory depression,  impaired thinking/driving, and addiction.  Patient was advised to avoid concurrent use with alcohol, to use medication only as needed and not to share with others  .          I have changed Ms. Lamping's SUMAtriptan. I am also having her start on ALPRAZolam. Additionally, I am having her maintain her pantoprazole, sucralfate, sertraline, cyclobenzaprine, and clarithromycin.  Meds ordered this encounter  Medications  . ALPRAZolam (XANAX) 0.5 MG tablet    Sig: Take 1 tablet (0.5 mg total) by mouth 2 (two) times daily as needed for anxiety.    Dispense:  30 tablet    Refill:  0  . SUMAtriptan (IMITREX) 100 MG tablet    Sig: Take 1 tablet (100 mg total) by mouth once. May repeat in 2 hours if needed    Dispense:  10 tablet    Refill:  5    Medications Discontinued During This Encounter  Medication Reason  . SUMAtriptan (IMITREX) 100 MG tablet Reorder    Follow-up: Return in about 6 months (around 02/15/2017).   Crecencio Mc, MD

## 2016-08-16 NOTE — Progress Notes (Signed)
Pre visit review using our clinic review tool, if applicable. No additional management support is needed unless otherwise documented below in the visit note. 

## 2016-08-17 NOTE — Assessment & Plan Note (Signed)
Did not tolerate zoloft.  Will consider paxil if needed.  Discussed natural remedies for insomnia including herbal tea and melatonin, which she has tried.  Reviewed principles of good sleep hygiene. The risks and benefits of benzodiazepine use were discussed with patient today including excessive sedation leading to respiratory depression,  impaired thinking/driving, and addiction.  Patient was advised to avoid concurrent use with alcohol, to use medication only as needed and not to share with others  .

## 2016-09-28 DIAGNOSIS — H524 Presbyopia: Secondary | ICD-10-CM | POA: Diagnosis not present

## 2016-10-19 DIAGNOSIS — L700 Acne vulgaris: Secondary | ICD-10-CM | POA: Diagnosis not present

## 2016-10-19 DIAGNOSIS — Z85828 Personal history of other malignant neoplasm of skin: Secondary | ICD-10-CM | POA: Diagnosis not present

## 2016-10-19 DIAGNOSIS — L814 Other melanin hyperpigmentation: Secondary | ICD-10-CM | POA: Diagnosis not present

## 2016-10-19 DIAGNOSIS — L820 Inflamed seborrheic keratosis: Secondary | ICD-10-CM | POA: Diagnosis not present

## 2016-10-19 DIAGNOSIS — Z1283 Encounter for screening for malignant neoplasm of skin: Secondary | ICD-10-CM | POA: Diagnosis not present

## 2016-10-19 DIAGNOSIS — L821 Other seborrheic keratosis: Secondary | ICD-10-CM | POA: Diagnosis not present

## 2016-10-19 DIAGNOSIS — D485 Neoplasm of uncertain behavior of skin: Secondary | ICD-10-CM | POA: Diagnosis not present

## 2016-10-19 DIAGNOSIS — D229 Melanocytic nevi, unspecified: Secondary | ICD-10-CM | POA: Diagnosis not present

## 2017-02-09 NOTE — Telephone Encounter (Signed)
Orders

## 2017-05-25 ENCOUNTER — Ambulatory Visit (INDEPENDENT_AMBULATORY_CARE_PROVIDER_SITE_OTHER): Payer: 59 | Admitting: Internal Medicine

## 2017-05-25 ENCOUNTER — Encounter: Payer: Self-pay | Admitting: Internal Medicine

## 2017-05-25 VITALS — BP 100/58 | HR 70 | Temp 98.1°F | Resp 14 | Ht 62.5 in | Wt 110.8 lb

## 2017-05-25 DIAGNOSIS — R7301 Impaired fasting glucose: Secondary | ICD-10-CM

## 2017-05-25 DIAGNOSIS — K2100 Gastro-esophageal reflux disease with esophagitis, without bleeding: Secondary | ICD-10-CM

## 2017-05-25 DIAGNOSIS — I7300 Raynaud's syndrome without gangrene: Secondary | ICD-10-CM

## 2017-05-25 DIAGNOSIS — Z860100 Personal history of colon polyps, unspecified: Secondary | ICD-10-CM

## 2017-05-25 DIAGNOSIS — E559 Vitamin D deficiency, unspecified: Secondary | ICD-10-CM

## 2017-05-25 DIAGNOSIS — Z1231 Encounter for screening mammogram for malignant neoplasm of breast: Secondary | ICD-10-CM

## 2017-05-25 DIAGNOSIS — Z13220 Encounter for screening for lipoid disorders: Secondary | ICD-10-CM | POA: Diagnosis not present

## 2017-05-25 DIAGNOSIS — Z0000 Encounter for general adult medical examination without abnormal findings: Secondary | ICD-10-CM

## 2017-05-25 DIAGNOSIS — Z23 Encounter for immunization: Secondary | ICD-10-CM | POA: Diagnosis not present

## 2017-05-25 DIAGNOSIS — Z1239 Encounter for other screening for malignant neoplasm of breast: Secondary | ICD-10-CM

## 2017-05-25 LAB — COMPREHENSIVE METABOLIC PANEL
ALK PHOS: 37 U/L — AB (ref 39–117)
ALT: 21 U/L (ref 0–35)
AST: 20 U/L (ref 0–37)
Albumin: 4.5 g/dL (ref 3.5–5.2)
BILIRUBIN TOTAL: 0.6 mg/dL (ref 0.2–1.2)
BUN: 9 mg/dL (ref 6–23)
CO2: 32 mEq/L (ref 19–32)
Calcium: 9.3 mg/dL (ref 8.4–10.5)
Chloride: 102 mEq/L (ref 96–112)
Creatinine, Ser: 0.58 mg/dL (ref 0.40–1.20)
GFR: 117.01 mL/min (ref >60.00)
GLUCOSE: 90 mg/dL (ref 70–99)
Potassium: 4.3 mEq/L (ref 3.5–5.1)
SODIUM: 140 meq/L (ref 135–145)
TOTAL PROTEIN: 6.5 g/dL (ref 6.0–8.3)

## 2017-05-25 LAB — LIPID PANEL
CHOL/HDL RATIO: 3
Cholesterol: 184 mg/dL (ref 0–200)
HDL: 58.5 mg/dL (ref >39.00)
LDL Cholesterol: 116 mg/dL — ABNORMAL HIGH (ref 0–99)
NONHDL: 125.51
Triglycerides: 49 mg/dL (ref 0.0–149.0)
VLDL: 9.8 mg/dL (ref 0.0–40.0)

## 2017-05-25 LAB — VITAMIN D 25 HYDROXY (VIT D DEFICIENCY, FRACTURES): VITD: 26.64 ng/mL — ABNORMAL LOW (ref 30.00–100.00)

## 2017-05-25 LAB — HEMOGLOBIN A1C: HEMOGLOBIN A1C: 5.6 % (ref 4.6–6.5)

## 2017-05-25 MED ORDER — PANTOPRAZOLE SODIUM 40 MG PO TBEC: 40.0000 mg | DELAYED_RELEASE_TABLET | Freq: Every day | ORAL | 2 refills | Status: DC

## 2017-05-25 MED ORDER — FAMOTIDINE 40 MG PO TABS: 40.0000 mg | ORAL_TABLET | Freq: Every day | ORAL | 0 refills | Status: DC

## 2017-05-25 MED ORDER — ALPRAZOLAM 0.5 MG PO TABS: 0.5000 mg | ORAL_TABLET | Freq: Two times a day (BID) | ORAL | 1 refills | Status: DC | PRN

## 2017-05-25 MED ORDER — CYCLOBENZAPRINE HCL 10 MG PO TABS: 10.0000 mg | ORAL_TABLET | Freq: Every day | ORAL | 11 refills | Status: DC

## 2017-05-25 NOTE — Patient Instructions (Signed)
Adding 40 mg famotidine in afternoon  For evening reflux management  Try substituting famotidine in the morning once in a while   TdaP given today    Health Maintenance for Postmenopausal Women Menopause is a normal process in which your reproductive ability comes to an end. This process happens gradually over a span of months to years, usually between the ages of 38 and 27. Menopause is complete when you have missed 12 consecutive menstrual periods. It is important to talk with your health care provider about some of the most common conditions that affect postmenopausal women, such as heart disease, cancer, and bone loss (osteoporosis). Adopting a healthy lifestyle and getting preventive care can help to promote your health and wellness. Those actions can also lower your chances of developing some of these common conditions. What should I know about menopause? During menopause, you may experience a number of symptoms, such as:  Moderate-to-severe hot flashes.  Night sweats.  Decrease in sex drive.  Mood swings.  Headaches.  Tiredness.  Irritability.  Memory problems.  Insomnia.  Choosing to treat or not to treat menopausal changes is an individual decision that you make with your health care provider. What should I know about hormone replacement therapy and supplements? Hormone therapy products are effective for treating symptoms that are associated with menopause, such as hot flashes and night sweats. Hormone replacement carries certain risks, especially as you become older. If you are thinking about using estrogen or estrogen with progestin treatments, discuss the benefits and risks with your health care provider. What should I know about heart disease and stroke? Heart disease, heart attack, and stroke become more likely as you age. This may be due, in part, to the hormonal changes that your body experiences during menopause. These can affect how your body processes dietary  fats, triglycerides, and cholesterol. Heart attack and stroke are both medical emergencies. There are many things that you can do to help prevent heart disease and stroke:  Have your blood pressure checked at least every 1-2 years. High blood pressure causes heart disease and increases the risk of stroke.  If you are 13-51 years old, ask your health care provider if you should take aspirin to prevent a heart attack or a stroke.  Do not use any tobacco products, including cigarettes, chewing tobacco, or electronic cigarettes. If you need help quitting, ask your health care provider.  It is important to eat a healthy diet and maintain a healthy weight. ? Be sure to include plenty of vegetables, fruits, low-fat dairy products, and lean protein. ? Avoid eating foods that are high in solid fats, added sugars, or salt (sodium).  Get regular exercise. This is one of the most important things that you can do for your health. ? Try to exercise for at least 150 minutes each week. The type of exercise that you do should increase your heart rate and make you sweat. This is known as moderate-intensity exercise. ? Try to do strengthening exercises at least twice each week. Do these in addition to the moderate-intensity exercise.  Know your numbers.Ask your health care provider to check your cholesterol and your blood glucose. Continue to have your blood tested as directed by your health care provider.  What should I know about cancer screening? There are several types of cancer. Take the following steps to reduce your risk and to catch any cancer development as early as possible. Breast Cancer  Practice breast self-awareness. ? This means understanding how your breasts  normally appear and feel. ? It also means doing regular breast self-exams. Let your health care provider know about any changes, no matter how small.  If you are 53 or older, have a clinician do a breast exam (clinical breast exam or  CBE) every year. Depending on your age, family history, and medical history, it may be recommended that you also have a yearly breast X-ray (mammogram).  If you have a family history of breast cancer, talk with your health care provider about genetic screening.  If you are at high risk for breast cancer, talk with your health care provider about having an MRI and a mammogram every year.  Breast cancer (BRCA) gene test is recommended for women who have family members with BRCA-related cancers. Results of the assessment will determine the need for genetic counseling and BRCA1 and for BRCA2 testing. BRCA-related cancers include these types: ? Breast. This occurs in males or females. ? Ovarian. ? Tubal. This may also be called fallopian tube cancer. ? Cancer of the abdominal or pelvic lining (peritoneal cancer). ? Prostate. ? Pancreatic.  Cervical, Uterine, and Ovarian Cancer Your health care provider may recommend that you be screened regularly for cancer of the pelvic organs. These include your ovaries, uterus, and vagina. This screening involves a pelvic exam, which includes checking for microscopic changes to the surface of your cervix (Pap test).  For women ages 21-65, health care providers may recommend a pelvic exam and a Pap test every three years. For women ages 6-65, they may recommend the Pap test and pelvic exam, combined with testing for human papilloma virus (HPV), every five years. Some types of HPV increase your risk of cervical cancer. Testing for HPV may also be done on women of any age who have unclear Pap test results.  Other health care providers may not recommend any screening for nonpregnant women who are considered low risk for pelvic cancer and have no symptoms. Ask your health care provider if a screening pelvic exam is right for you.  If you have had past treatment for cervical cancer or a condition that could lead to cancer, you need Pap tests and screening for cancer  for at least 20 years after your treatment. If Pap tests have been discontinued for you, your risk factors (such as having a new sexual partner) need to be reassessed to determine if you should start having screenings again. Some women have medical problems that increase the chance of getting cervical cancer. In these cases, your health care provider may recommend that you have screening and Pap tests more often.  If you have a family history of uterine cancer or ovarian cancer, talk with your health care provider about genetic screening.  If you have vaginal bleeding after reaching menopause, tell your health care provider.  There are currently no reliable tests available to screen for ovarian cancer.  Lung Cancer Lung cancer screening is recommended for adults 68-62 years old who are at high risk for lung cancer because of a history of smoking. A yearly low-dose CT scan of the lungs is recommended if you:  Currently smoke.  Have a history of at least 30 pack-years of smoking and you currently smoke or have quit within the past 15 years. A pack-year is smoking an average of one pack of cigarettes per day for one year.  Yearly screening should:  Continue until it has been 15 years since you quit.  Stop if you develop a health problem that would prevent  you from having lung cancer treatment.  Colorectal Cancer  This type of cancer can be detected and can often be prevented.  Routine colorectal cancer screening usually begins at age 54 and continues through age 29.  If you have risk factors for colon cancer, your health care provider may recommend that you be screened at an earlier age.  If you have a family history of colorectal cancer, talk with your health care provider about genetic screening.  Your health care provider may also recommend using home test kits to check for hidden blood in your stool.  A small camera at the end of a tube can be used to examine your colon directly  (sigmoidoscopy or colonoscopy). This is done to check for the earliest forms of colorectal cancer.  Direct examination of the colon should be repeated every 5-10 years until age 69. However, if early forms of precancerous polyps or small growths are found or if you have a family history or genetic risk for colorectal cancer, you may need to be screened more often.  Skin Cancer  Check your skin from head to toe regularly.  Monitor any moles. Be sure to tell your health care provider: ? About any new moles or changes in moles, especially if there is a change in a mole's shape or color. ? If you have a mole that is larger than the size of a pencil eraser.  If any of your family members has a history of skin cancer, especially at a young age, talk with your health care provider about genetic screening.  Always use sunscreen. Apply sunscreen liberally and repeatedly throughout the day.  Whenever you are outside, protect yourself by wearing long sleeves, pants, a wide-brimmed hat, and sunglasses.  What should I know about osteoporosis? Osteoporosis is a condition in which bone destruction happens more quickly than new bone creation. After menopause, you may be at an increased risk for osteoporosis. To help prevent osteoporosis or the bone fractures that can happen because of osteoporosis, the following is recommended:  If you are 73-23 years old, get at least 1,000 mg of calcium and at least 600 mg of vitamin D per day.  If you are older than age 21 but younger than age 27, get at least 1,200 mg of calcium and at least 600 mg of vitamin D per day.  If you are older than age 52, get at least 1,200 mg of calcium and at least 800 mg of vitamin D per day.  Smoking and excessive alcohol intake increase the risk of osteoporosis. Eat foods that are rich in calcium and vitamin D, and do weight-bearing exercises several times each week as directed by your health care provider. What should I know about  how menopause affects my mental health? Depression may occur at any age, but it is more common as you become older. Common symptoms of depression include:  Low or sad mood.  Changes in sleep patterns.  Changes in appetite or eating patterns.  Feeling an overall lack of motivation or enjoyment of activities that you previously enjoyed.  Frequent crying spells.  Talk with your health care provider if you think that you are experiencing depression. What should I know about immunizations? It is important that you get and maintain your immunizations. These include:  Tetanus, diphtheria, and pertussis (Tdap) booster vaccine.  Influenza every year before the flu season begins.  Pneumonia vaccine.  Shingles vaccine.  Your health care provider may also recommend other immunizations. This information  is not intended to replace advice given to you by your health care provider. Make sure you discuss any questions you have with your health care provider. Document Released: 06/25/2005 Document Revised: 11/21/2015 Document Reviewed: 02/04/2015 Elsevier Interactive Patient Education  2018 [employer].

## 2017-05-25 NOTE — Progress Notes (Signed)
Patient ID: Stephanie Henderson, female    DOB: 03/31/1966  Age: 52 y.o. MRN: [id number]  The patient is here for annual  Non gyn preventnive  examination and management of other chronic and acute problems.  Pelvic exams done by [facility],  S/p hysterectomy  mammogrram dec 2017 abnormal  Biopsy done , normal.  annual screening recommended colonoscopy in 2015 due to [place] of colon CA,  normal.  Due again in 2020 .  Reynaud's,  GERD,  EGD 5 yrs ago for esophagitis Neck spasm rarely,  Treated with xanax history of microscopic hematuria cystoscopy negative      The risk factors are reflected in the social history.  The roster of all physicians providing medical care to patient - is listed in the Snapshot section of the chart.  Activities of daily living:  The patient is 100% independent in all ADLs: dressing, toileting, feeding as well as independent mobility  Home safety : The patient has smoke detectors in the home. They wear seatbelts.  There are no firearms at home. There is no violence in the home.   There is no risks for hepatitis, STDs or HIV. There is no   history of blood transfusion. They have no travel history to infectious disease endemic areas of the world.  The patient has seen their dentist in the last six month. They have seen their eye doctor in the last year.  Discussed the need for sun protection: hats, long sleeves and use of sunscreen if there is significant sun exposure.   Diet: the importance of a healthy diet is discussed. They do have a healthy diet.  The benefits of regular aerobic exercise were discussed. She walks 4 times per week ,  20 minutes.   Depression screen: there are no signs or vegative symptoms of depression- irritability, change in appetite, anhedonia, sadness/tearfullness.  Cognitive assessment: the patient manages all their financial and personal affairs and is actively engaged. They could relate day,date,year and events; recalled 2/3 objects at 3  minutes; performed clock-face test normally.  The following portions of the patient's history were reviewed and updated as appropriate: allergies, current medications, past family history, past medical history,  past surgical history, past social history  and problem list.  Visual acuity was not assessed per patient preference since she has regular follow up with her ophthalmologist. Hearing and body mass index were assessed and reviewed.   During the course of the visit the patient was educated and counseled about appropriate screening and preventive services including : fall prevention , diabetes screening, nutrition counseling, colorectal cancer screening, and recommended immunizations.    CC: The primary encounter diagnosis was Screening for hyperlipidemia. Diagnoses of Hx of colonic polyps, Vitamin D deficiency, Breast cancer screening, Impaired fasting glucose, Need for tetanus, diphtheria, and acellular pertussis (Tdap) vaccine, Raynaud's phenomenon without gangrene, and Gastroesophageal reflux disease with esophagitis were also pertinent to this visit.  History Paisley has a past medical history of Fibroid, GERD (gastroesophageal reflux disease) (Aug 2012), Hematuria, colonic polyps (August 2012), Menorrhagia, Migraine headache, and Migraine syndrome.   She has a past surgical history that includes laparoscopy; Cystoscopy; and Vaginal hysterectomy (6/13).   Her family history includes Cancer (age of onset: 75) in her father; Colon cancer in her father; Depression in her father; Hyperlipidemia in her mother; Kidney disease in her daughter; Mental illness in her father and mother.She reports that  has never smoked. she has never used smokeless tobacco. She reports that she drinks about 2.4 -  3.0 oz of alcohol per week. She reports that she does not use drugs.  Outpatient Medications Prior to Visit  Medication Sig Dispense Refill  . sucralfate (CARAFATE) 1 G tablet Take 1 g by mouth 3 (three)  times daily as needed.      . ALPRAZolam (XANAX) 0.5 MG tablet Take 1 tablet (0.5 mg total) by mouth 2 (two) times daily as needed for anxiety. 30 tablet 0  . cyclobenzaprine (FLEXERIL) 10 MG tablet Take 1 tablet (10 mg total) by mouth at bedtime. 30 tablet 0  . pantoprazole (PROTONIX) 40 MG tablet Take 40 mg by mouth daily.      . SUMAtriptan (IMITREX) 100 MG tablet Take 1 tablet (100 mg total) by mouth once. May repeat in 2 hours if needed 10 tablet 5  . clarithromycin (BIAXIN) 500 MG tablet Take 1 tablet (500 mg total) by mouth 2 (two) times daily. (Patient not taking: Reported on 08/16/2016) 20 tablet 0  . sertraline (ZOLOFT) 50 MG tablet Take 1 tablet (50 mg total) by mouth daily. (Patient not taking: Reported on 08/16/2016) 90 tablet 1   No facility-administered medications prior to visit.     Review of Systems   Patient denies headache, fevers, malaise, unintentional weight loss, skin rash, eye pain, sinus congestion and sinus pain, sore throat, dysphagia,  hemoptysis , cough, dyspnea, wheezing, chest pain, palpitations, orthopnea, edema, abdominal pain, nausea, melena, diarrhea, constipation, flank pain, dysuria, hematuria, urinary  Frequency, nocturia, numbness, tingling, seizures,  Focal weakness, Loss of consciousness,  Tremor, insomnia, depression, anxiety, and suicidal ideation.      Objective:  BP (!) 100/58 (BP Location: Left Arm, Patient Position: Sitting, Cuff Size: Normal)   Pulse 70   Temp 98.1 F (36.7 C) (Oral)   Resp 14   Ht 5' 2.5" (1.588 m)   Wt 110 lb 12.8 oz (50.3 kg)   LMP 02/20/2011   BMI 19.94 kg/m   Physical Exam   General appearance: alert, cooperative and appears stated age Head: Normocephalic, without obvious abnormality, atraumatic Eyes: conjunctivae/corneas clear. PERRL, EOM's intact. Fundi benign. Ears: normal TM's and external ear canals both ears Nose: Nares normal. Septum midline. Mucosa normal. No drainage or sinus tenderness. Throat: lips,  mucosa, and tongue normal; teeth and gums normal Neck: no adenopathy, no carotid bruit, no JVD, supple, symmetrical, trachea midline and thyroid not enlarged, symmetric, no tenderness/mass/nodules Lungs: clear to auscultation bilaterally Breasts: normal appearance, no masses or tenderness Heart: regular rate and rhythm, S1, S2 normal, no murmur, click, rub or gallop Abdomen: soft, non-tender; bowel sounds normal; no masses,  no organomegaly Extremities: extremities normal, atraumatic, no cyanosis or edema Pulses: 2+ and symmetric Skin: Skin color, texture, turgor normal. No rashes or lesions Neurologic: Alert and oriented X 3, normal strength and tone. Normal symmetric reflexes. Normal coordination and gait.      Assessment & Plan:   Problem List Items Addressed This Visit    GERD (gastroesophageal reflux disease)    Managed with protonix daily and prn pepcid for nighttime symptoms   Advised to also try substituting famotidine in the morning as well.       Relevant Medications   famotidine (PEPCID) 40 MG tablet   pantoprazole (PROTONIX) 40 MG tablet   Hx of colonic polyps    Last one 2015, normal.  Due in 2020        Raynaud phenomenon    Other Visit Diagnoses    Screening for hyperlipidemia    -  Primary  Relevant Orders   Lipid panel (Completed)   Vitamin D deficiency       Relevant Orders   VITAMIN D 25 Hydroxy (Vit-D Deficiency, Fractures) (Completed)   Breast cancer screening       Impaired fasting glucose       Relevant Orders   Hemoglobin A1c (Completed)   Comprehensive metabolic panel (Completed)   Need for tetanus, diphtheria, and acellular pertussis (Tdap) vaccine       Relevant Orders   Tdap vaccine greater than or equal to 7yo IM (Completed)      I have discontinued Lattie Haw T. Siordia's sertraline and clarithromycin. I have also changed her pantoprazole. Additionally, I am having her start on famotidine. Lastly, I am having her maintain her sucralfate,  SUMAtriptan, ALPRAZolam, and cyclobenzaprine.  Meds ordered this encounter  Medications  . ALPRAZolam (XANAX) 0.5 MG tablet    Sig: Take 1 tablet (0.5 mg total) by mouth 2 (two) times daily as needed for anxiety.    Dispense:  30 tablet    Refill:  1  . famotidine (PEPCID) 40 MG tablet    Sig: Take 1 tablet (40 mg total) by mouth daily.    Dispense:  90 tablet    Refill:  0  . cyclobenzaprine (FLEXERIL) 10 MG tablet    Sig: Take 1 tablet (10 mg total) by mouth at bedtime.    Dispense:  30 tablet    Refill:  11  . pantoprazole (PROTONIX) 40 MG tablet    Sig: Take 1 tablet (40 mg total) by mouth daily.    Dispense:  90 tablet    Refill:  2    Medications Discontinued During This Encounter  Medication Reason  . clarithromycin (BIAXIN) 500 MG tablet Completed Course  . sertraline (ZOLOFT) 50 MG tablet Patient has not taken in last 30 days  . ALPRAZolam (XANAX) 0.5 MG tablet Reorder  . cyclobenzaprine (FLEXERIL) 10 MG tablet Reorder  . pantoprazole (PROTONIX) 40 MG tablet Reorder    Follow-up: No Follow-up on file.   Crecencio Mc, MD

## 2017-05-27 ENCOUNTER — Other Ambulatory Visit: Payer: Self-pay | Admitting: Internal Medicine

## 2017-05-27 DIAGNOSIS — Z1231 Encounter for screening mammogram for malignant neoplasm of breast: Secondary | ICD-10-CM

## 2017-05-28 DIAGNOSIS — I7300 Raynaud's syndrome without gangrene: Secondary | ICD-10-CM | POA: Insufficient documentation

## 2017-05-28 NOTE — Assessment & Plan Note (Signed)
Managed with protonix daily and prn pepcid for nighttime symptoms   Advised to also try substituting famotidine in the morning as well.

## 2017-05-28 NOTE — Assessment & Plan Note (Signed)
Last one 2015, normal.  Due in 2020

## 2017-05-30 ENCOUNTER — Encounter: Payer: Self-pay | Admitting: Internal Medicine

## 2017-06-16 ENCOUNTER — Ambulatory Visit
Admission: RE | Admit: 2017-06-16 | Discharge: 2017-06-16 | Disposition: A | Discharge status: Home / Self Care | Payer: 59 | Source: Ambulatory Visit | Attending: Internal Medicine | Admitting: Internal Medicine

## 2017-06-16 DIAGNOSIS — Z1231 Encounter for screening mammogram for malignant neoplasm of breast: Secondary | ICD-10-CM | POA: Diagnosis not present

## 2017-08-22 ENCOUNTER — Encounter: Payer: Self-pay | Admitting: Certified Nurse Midwife

## 2017-08-22 ENCOUNTER — Ambulatory Visit (INDEPENDENT_AMBULATORY_CARE_PROVIDER_SITE_OTHER): Payer: 59 | Admitting: Certified Nurse Midwife

## 2017-08-22 VITALS — BP 101/49 | HR 71 | Ht 62.5 in | Wt 109.1 lb

## 2017-08-22 DIAGNOSIS — Z01419 Encounter for gynecological examination (general) (routine) without abnormal findings: Secondary | ICD-10-CM | POA: Diagnosis not present

## 2017-08-22 DIAGNOSIS — Z1231 Encounter for screening mammogram for malignant neoplasm of breast: Secondary | ICD-10-CM | POA: Diagnosis not present

## 2017-08-22 DIAGNOSIS — Z1239 Encounter for other screening for malignant neoplasm of breast: Secondary | ICD-10-CM

## 2017-08-22 NOTE — Progress Notes (Signed)
ANNUAL PREVENTATIVE CARE GYN  ENCOUNTER NOTE  Subjective:       Stephanie Henderson is a 52 y.o. [phone] female here for a routine annual gynecologic exam. No current complaints. Her breast exam was completed by her PCP. She was unaware that her cervix was surgically removed with her hysterectomy.   Denies difficulty breathing or respiratory distress, chest pain, abdominal pain, vaginal bleeding, dysuria, and leg pain or swelling.    Gynecologic History  Patient's last menstrual period was 02/20/2011.  Contraception: status post hysterectomy  Last Pap: 11/2013. Results were: Negative/Negative  Last mammogram: 05/2017. Results were: BI-RADS 1: Negative  Last Colonoscopy: 07/13/2013: Results were: Normal; repeat 2020 per GI note  Obstetric History  OB History  Gravida Para Term Preterm AB Living  3 2 2   1 2   SAB TAB Ectopic Multiple Live Births  1       2    # Outcome Date GA Lbr Len/2nd Weight Sex Delivery Anes PTL Lv  3 Term 2003   7 lb 4.8 oz (3.311 kg) F Vag-Spont   LIV  2 Term 1999   7 lb 9.6 oz (3.447 kg) M Vag-Spont   LIV  1 SAB 1998            Past Medical History:  Diagnosis Date  . Fibroid   . GERD (gastroesophageal reflux disease) Aug 2012   with esophagitis by EGD  . Hematuria   . Hx of colonic polyps August 2012   repeat due 2016,  5 polyps 2009, clear 2012 Gustavo Lah)  . Menorrhagia    did not tolerate trial of ocps due to migraines  . Migraine headache   . Migraine syndrome    since age 62,  sporadic    Past Surgical History:  Procedure Laterality Date  . BREAST BIOPSY Right 2017   benign  . CYSTOSCOPY    . laparoscopy    . VAGINAL HYSTERECTOMY  6/13   with morcellation    Current Outpatient Medications on File Prior to Visit  Medication Sig Dispense Refill  . ALPRAZolam (XANAX) 0.5 MG tablet Take 1 tablet (0.5 mg total) by mouth 2 (two) times daily as needed for anxiety. 30 tablet 1  . cyclobenzaprine (FLEXERIL) 10 MG tablet Take 1 tablet (10 mg  total) by mouth at bedtime. 30 tablet 11  . famotidine (PEPCID) 40 MG tablet Take 1 tablet (40 mg total) by mouth daily. 90 tablet 0  . pantoprazole (PROTONIX) 40 MG tablet Take 1 tablet (40 mg total) by mouth daily. 90 tablet 2  . sucralfate (CARAFATE) 1 G tablet Take 1 g by mouth 3 (three) times daily as needed.      . SUMAtriptan (IMITREX) 100 MG tablet Take 1 tablet (100 mg total) by mouth once. May repeat in 2 hours if needed 10 tablet 5   No current facility-administered medications on file prior to visit.     Allergies  Allergen Reactions  . Propoxyphene     Other reaction(s): Other (See Comments) Unknown  . Tetracyclines & Related Diarrhea and Nausea Only    Social History   Socioeconomic History  . Marital status: Married    Spouse name: Not on file  . Number of children: 2  . Years of education: Not on file  . Highest education level: Not on file  Occupational History  . Occupation: Therapist, sports - [employer]  Social Needs  . Financial resource strain: Not on file  . Food insecurity:  Worry: Not on file    Inability: Not on file  . Transportation needs:    Medical: Not on file    Non-medical: Not on file  Tobacco Use  . Smoking status: Never Smoker  . Smokeless tobacco: Never Used  Substance and Sexual Activity  . Alcohol use: Yes    Alcohol/week: 2.4 - 3.0 oz    Types: 4 - 5 Standard drinks or equivalent per week    Comment: OCCAS  . Drug use: No  . Sexual activity: Yes    Birth control/protection: Surgical  Lifestyle  . Physical activity:    Days per week: 2 days    Minutes per session: 30 min  . Stress: Not on file  Relationships  . Social connections:    Talks on phone: Not on file    Gets together: Not on file    Attends religious service: Not on file    Active member of club or organization: Not on file    Attends meetings of clubs or organizations: Not on file    Relationship status: Not on file  . Intimate partner violence:    Fear of  current or ex partner: Not on file    Emotionally abused: Not on file    Physically abused: Not on file    Forced sexual activity: Not on file  Other Topics Concern  . Not on file  Social History Narrative  . Not on file    Family History  Problem Relation Age of Onset  . Hyperlipidemia Mother   . Mental illness Mother        depression  . Colon cancer Father   . Depression Father   . Cancer Father 47       mets to lung   . Mental illness Father   . Kidney disease Daughter   . Diabetes Neg Hx   . Heart disease Neg Hx   . Breast cancer Neg Hx   . Ovarian cancer Neg Hx     The following portions of the patient's history were reviewed and updated as appropriate: allergies, current medications, past family history, past medical history, past social history, past surgical history and problem list.  Review of Systems  ROS negative except as noted above. Information obtained from patient.    Objective:   BP (!) 101/49   Pulse 71   Ht 5' 2.5" (1.588 m)   Wt 109 lb 1.6 oz (49.5 kg)   LMP 02/20/2011   BMI 19.64 kg/m    CONSTITUTIONAL: Well-developed, well-nourished female in no acute distress.   PSYCHIATRIC: Normal mood and affect. Normal behavior. Normal judgment and thought content.  [place]: Alert and oriented to person, place, and time. Normal muscle tone coordination. No cranial nerve deficit noted.  HENT:  Normocephalic, atraumatic, External right and left ear normal.  EYES: Conjunctivae and EOM are normal. Pupils are equal and round.   NECK: Normal range of motion, supple, no masses.  Normal thyroid.   SKIN: Skin is warm and dry. No rash noted. Not diaphoretic. No erythema. No pallor.  CARDIOVASCULAR: Normal heart rate noted, regular rhythm, no murmur.  RESPIRATORY: Clear to auscultation bilaterally. Effort and breath sounds normal, no problems with respiration noted.  BREASTS: Not examined.   ABDOMEN: Soft, no distention noted.  No tenderness, rebound or  guarding.   PELVIC:  External Genitalia: Normal  Vagina: Normal  Cervix: Surgically absent  Uterus: Surgically absent  Adnexa: Normal   MUSCULOSKELETAL: Normal range of motion. No  tenderness.  No cyanosis, clubbing, or edema.   LYMPHATIC: No Axillary, Supraclavicular, or Inguinal Adenopathy.  Assessment:   Annual gynecologic examination 52 y.o.   Contraception: post menopausal status   Normal BMI   Problem List Items Addressed This Visit    None    Visit Diagnoses    Well woman exam with routine gynecological exam    -  Primary   Screening for breast cancer       Relevant Orders   MM DIGITAL SCREENING BILATERAL      Plan:   Pap: Not needed and Not done.  Mammogram: Ordered.  Labs: Managed by PCP.   Routine preventative health maintenance measures emphasized: Exercise/Diet/Weight control, Tobacco Warnings, Alcohol/Substance use risks and Stress Management; see AVS.  RTC x 3 years for Annual Exam or sooner if needed.    Diona Fanti, CNM [facility], [facility]

## 2017-08-22 NOTE — Patient Instructions (Signed)
Preventive Care 40-64 Years, Female Preventive care refers to lifestyle choices and visits with your health care provider that can promote health and wellness. What does preventive care include?  A yearly physical exam. This is also called an annual well check.  Dental exams once or twice a year.  Routine eye exams. Ask your health care provider how often you should have your eyes checked.  Personal lifestyle choices, including: ? Daily care of your teeth and gums. ? Regular physical activity. ? Eating a healthy diet. ? Avoiding tobacco and drug use. ? Limiting alcohol use. ? Practicing safe sex. ? Taking low-dose aspirin daily starting at age 58. ? Taking vitamin and mineral supplements as recommended by your health care provider. What happens during an annual well check? The services and screenings done by your health care provider during your annual well check will depend on your age, overall health, lifestyle risk factors, and family history of disease. Counseling Your health care provider may ask you questions about your:  Alcohol use.  Tobacco use.  Drug use.  Emotional well-being.  Home and relationship well-being.  Sexual activity.  Eating habits.  Work and work Statistician.  Method of birth control.  Menstrual cycle.  Pregnancy history.  Screening You may have the following tests or measurements:  Height, weight, and BMI.  Blood pressure.  Lipid and cholesterol levels. These may be checked every 5 years, or more frequently if you are over 81 years old.  Skin check.  Lung cancer screening. You may have this screening every year starting at age 78 if you have a 30-pack-year history of smoking and currently smoke or have quit within the past 15 years.  Fecal occult blood test (FOBT) of the stool. You may have this test every year starting at age 65.  Flexible sigmoidoscopy or colonoscopy. You may have a sigmoidoscopy every 5 years or a colonoscopy  every 10 years starting at age 30.  Hepatitis C blood test.  Hepatitis B blood test.  Sexually transmitted disease (STD) testing.  Diabetes screening. This is done by checking your blood sugar (glucose) after you have not eaten for a while (fasting). You may have this done every 1-3 years.  Mammogram. This may be done every 1-2 years. Talk to your health care provider about when you should start having regular mammograms. This may depend on whether you have a family history of breast cancer.  BRCA-related cancer screening. This may be done if you have a family history of breast, ovarian, tubal, or peritoneal cancers.  Pelvic exam and Pap test. This may be done every 3 years starting at age 80. Starting at age 36, this may be done every 5 years if you have a Pap test in combination with an HPV test.  Bone density scan. This is done to screen for osteoporosis. You may have this scan if you are at high risk for osteoporosis.  Discuss your test results, treatment options, and if necessary, the need for more tests with your health care provider. Vaccines Your health care provider may recommend certain vaccines, such as:  Influenza vaccine. This is recommended every year.  Tetanus, diphtheria, and acellular pertussis (Tdap, Td) vaccine. You may need a Td booster every 10 years.  Varicella vaccine. You may need this if you have not been vaccinated.  Zoster vaccine. You may need this after age 5.  Measles, mumps, and rubella (MMR) vaccine. You may need at least one dose of MMR if you were born in  1957 or later. You may also need a second dose.  Pneumococcal 13-valent conjugate (PCV13) vaccine. You may need this if you have certain conditions and were not previously vaccinated.  Pneumococcal polysaccharide (PPSV23) vaccine. You may need one or two doses if you smoke cigarettes or if you have certain conditions.  Meningococcal vaccine. You may need this if you have certain  conditions.  Hepatitis A vaccine. You may need this if you have certain conditions or if you travel or work in places where you may be exposed to hepatitis A.  Hepatitis B vaccine. You may need this if you have certain conditions or if you travel or work in places where you may be exposed to hepatitis B.  Haemophilus influenzae type b (Hib) vaccine. You may need this if you have certain conditions.  Talk to your health care provider about which screenings and vaccines you need and how often you need them. This information is not intended to replace advice given to you by your health care provider. Make sure you discuss any questions you have with your health care provider. Document Released: 05/30/2015 Document Revised: 01/21/2016 Document Reviewed: 03/04/2015 Elsevier Interactive Patient Education  2018 Elsevier Inc.  

## 2017-10-11 DIAGNOSIS — H524 Presbyopia: Secondary | ICD-10-CM | POA: Diagnosis not present

## 2018-01-03 DIAGNOSIS — Z85828 Personal history of other malignant neoplasm of skin: Secondary | ICD-10-CM | POA: Diagnosis not present

## 2018-01-03 DIAGNOSIS — L578 Other skin changes due to chronic exposure to nonionizing radiation: Secondary | ICD-10-CM | POA: Diagnosis not present

## 2018-01-03 DIAGNOSIS — Z1283 Encounter for screening for malignant neoplasm of skin: Secondary | ICD-10-CM | POA: Diagnosis not present

## 2018-01-03 DIAGNOSIS — L821 Other seborrheic keratosis: Secondary | ICD-10-CM | POA: Diagnosis not present

## 2018-01-03 DIAGNOSIS — L820 Inflamed seborrheic keratosis: Secondary | ICD-10-CM | POA: Diagnosis not present

## 2018-01-03 DIAGNOSIS — L739 Follicular disorder, unspecified: Secondary | ICD-10-CM | POA: Diagnosis not present

## 2018-01-03 DIAGNOSIS — L812 Freckles: Secondary | ICD-10-CM | POA: Diagnosis not present

## 2018-01-03 DIAGNOSIS — D485 Neoplasm of uncertain behavior of skin: Secondary | ICD-10-CM | POA: Diagnosis not present

## 2018-01-03 DIAGNOSIS — D229 Melanocytic nevi, unspecified: Secondary | ICD-10-CM | POA: Diagnosis not present

## 2018-01-29 ENCOUNTER — Encounter: Payer: Self-pay | Admitting: Gynecology

## 2018-01-29 ENCOUNTER — Ambulatory Visit
Admission: EM | Admit: 2018-01-29 | Discharge: 2018-01-29 | Disposition: A | Discharge status: Home / Self Care | Payer: 59 | Attending: Family Medicine | Admitting: Family Medicine

## 2018-01-29 ENCOUNTER — Other Ambulatory Visit: Payer: Self-pay

## 2018-01-29 DIAGNOSIS — J0100 Acute maxillary sinusitis, unspecified: Secondary | ICD-10-CM | POA: Diagnosis not present

## 2018-01-29 DIAGNOSIS — R0981 Nasal congestion: Secondary | ICD-10-CM | POA: Diagnosis not present

## 2018-01-29 MED ORDER — FLUTICASONE PROPIONATE 50 MCG/ACT NA SUSP: 1.0000 | Freq: Two times a day (BID) | NASAL | 0 refills | Status: DC

## 2018-01-29 MED ORDER — AMOXICILLIN 875 MG PO TABS: 875.0000 mg | ORAL_TABLET | Freq: Two times a day (BID) | ORAL | 0 refills | Status: AC

## 2018-01-29 NOTE — Discharge Instructions (Signed)
SINUSITIS: Reviewed use of a nasal saline irrigation system. May consider use of intranasal steroids, such as Flonase as well. Use medications as directed. If antibiotics are prescribed, take the full course of antibiotics. Also consider use of Sudafed or Mucinex D. Increase rest, fluids. If you do not improve or if you worsen after a course of antibiotics, you should be re-examined. You may need a different antibiotic or further evaluation with imaging or an exam of the inside of the sinuses  °

## 2018-01-29 NOTE — ED Provider Notes (Signed)
[facility]    CSN: [id number] Arrival date & time: 01/29/18  9562     History   Chief Complaint No chief complaint on file.   HPI Stephanie Henderson is a 52 y.o. female. Patient presents with nasal congestion, facial pain with sinus pressure radiating to teeth, post nasal drainage, cough and low grade fevers. She states that she had cold symptoms at onset about a week ago and symptoms had nearly resolved until a few days ago. She says she suddenly became very sick with a lot of facial pain and nasal congestion. She has taken Mucinex and other OTC cough medications without relief.  HPI  Past Medical History:  Diagnosis Date  . Fibroid   . GERD (gastroesophageal reflux disease) Aug 2012   with esophagitis by EGD  . Hematuria   . Hx of colonic polyps August 2012   repeat due 2016,  5 polyps 2009, clear 2012 Gustavo Lah)  . Menorrhagia    did not tolerate trial of ocps due to migraines  . Migraine headache   . Migraine syndrome    since age 69,  sporadic    Patient Active Problem List   Diagnosis Date Noted  . Raynaud phenomenon 05/28/2017  . Generalized anxiety disorder 05/29/2016  . S/P hysterectomy 05/29/2016  . Headache 04/14/2016  . History of mammogram 03/02/2011  . Migraine syndrome   . Hx of colonic polyps   . GERD (gastroesophageal reflux disease) 12/16/2010    Past Surgical History:  Procedure Laterality Date  . BREAST BIOPSY Right 2017   benign  . CYSTOSCOPY    . laparoscopy    . VAGINAL HYSTERECTOMY  6/13   with morcellation    OB History    Gravida  3   Para  2   Term  2   Preterm      AB  1   Living  2     SAB  1   TAB      Ectopic      Multiple      Live Births  2            Home Medications    Prior to Admission medications   Medication Sig Start Date End Date Taking? Authorizing Provider  ALPRAZolam Duanne Moron) 0.5 MG tablet Take 1 tablet (0.5 mg total) by mouth 2 (two) times daily as needed for anxiety. 05/25/17   Yes Crecencio Mc, MD  cyclobenzaprine (FLEXERIL) 10 MG tablet Take 1 tablet (10 mg total) by mouth at bedtime. 05/25/17  Yes Crecencio Mc, MD  famotidine (PEPCID) 40 MG tablet Take 1 tablet (40 mg total) by mouth daily. 05/25/17  Yes Crecencio Mc, MD  pantoprazole (PROTONIX) 40 MG tablet Take 1 tablet (40 mg total) by mouth daily. 05/25/17  Yes Crecencio Mc, MD  sucralfate (CARAFATE) 1 G tablet Take 1 g by mouth 3 (three) times daily as needed.     Yes [provider]  amoxicillin (AMOXIL) 875 MG tablet Take 1 tablet (875 mg total) by mouth 2 (two) times daily for 10 days. 01/29/18 02/08/18  Laurene Footman B, PA-C  fluticasone (FLONASE) 50 MCG/ACT nasal spray Place 1 spray into both nostrils 2 (two) times daily for 10 days. 01/29/18 02/08/18  Danton Clap, PA-C  SUMAtriptan (IMITREX) 100 MG tablet Take 1 tablet (100 mg total) by mouth once. May repeat in 2 hours if needed 08/16/16 08/22/17  Crecencio Mc, MD    Family History  Family History  Problem Relation Age of Onset  . Hyperlipidemia Mother   . Mental illness Mother        depression  . Colon cancer Father   . Depression Father   . Cancer Father 46       mets to lung   . Mental illness Father   . Kidney disease Daughter   . Diabetes Neg Hx   . Heart disease Neg Hx   . Breast cancer Neg Hx   . Ovarian cancer Neg Hx     Social History Social History   Tobacco Use  . Smoking status: Never Smoker  . Smokeless tobacco: Never Used  Substance Use Topics  . Alcohol use: Yes    Alcohol/week: 4.0 - 5.0 standard drinks    Types: 4 - 5 Standard drinks or equivalent per week    Comment: OCCAS  . Drug use: No     Allergies   Propoxyphene and Tetracyclines & related   Review of Systems Review of Systems  Constitutional: Positive for fatigue and fever. Negative for appetite change.  HENT: Positive for postnasal drip, rhinorrhea, sinus pressure, sinus pain and sore throat. Negative for ear discharge, ear pain and  trouble swallowing.   Eyes: Negative for discharge and redness.  Respiratory: Positive for cough. Negative for chest tightness, shortness of breath and wheezing.   Cardiovascular: Negative for chest pain.  Gastrointestinal: Negative for abdominal pain, nausea and vomiting.  Musculoskeletal: Negative for arthralgias and myalgias.  Skin: Negative for color change and rash.  Allergic/Immunologic: Negative for environmental allergies.  Neurological: Negative for dizziness, weakness and headaches.  Hematological: Negative for adenopathy.     Physical Exam Triage Vital Signs ED Triage Vitals  Enc Vitals Group     BP 01/29/18 0926 121/73     Pulse Rate 01/29/18 0926 (!) 108     Resp 01/29/18 0926 16     Temp 01/29/18 0926 98.7 F (37.1 C)     Temp Source 01/29/18 0926 Oral     SpO2 01/29/18 0926 100 %     Weight 01/29/18 0926 110 lb (49.9 kg)     Height 01/29/18 0926 5\' 2"  (1.575 m)     Head Circumference --      Peak Flow --      Pain Score 01/29/18 0925 5     Pain Loc --      Pain Edu? --      Excl. in [place]? --    No data found.  Updated Vital Signs BP 121/73 (BP Location: Left Arm)   Pulse (!) 108   Temp 98.7 F (37.1 C) (Oral)   Resp 16   Ht 5\' 2"  (1.575 m)   Wt 110 lb (49.9 kg)   LMP 02/20/2011   SpO2 100%   BMI 20.12 kg/m       Physical Exam  Constitutional: She is oriented to person, place, and time. She appears well-developed and well-nourished.  HENT:  Head: Normocephalic and atraumatic.  Right Ear: External ear normal.  Left Ear: External ear normal.  Nose: Rhinorrhea (light yellow-moderate) and sinus tenderness present. No septal deviation. Left sinus exhibits maxillary sinus tenderness.  Mouth/Throat: Oropharynx is clear and moist.  Eyes: Pupils are equal, round, and reactive to light. EOM are normal. Right eye exhibits no discharge. Left eye exhibits no discharge.  Neck: Normal range of motion. Neck supple.  Cardiovascular: Normal rate, regular rhythm  and normal heart sounds.  No murmur heard. Pulmonary/Chest: Effort normal  and breath sounds normal. No respiratory distress. She has no wheezes.  Lymphadenopathy:    She has no cervical adenopathy.  Neurological: She is alert and oriented to person, place, and time.  Psychiatric: She has a normal mood and affect. Her behavior is normal.  Nursing note and vitals reviewed.    UC Treatments / Results  Labs (all labs ordered are listed, but only abnormal results are displayed) Labs Reviewed - No data to display  EKG None  Radiology No results found.  Procedures Procedures (including critical care time)  Medications Ordered in UC Medications - No data to display  Initial Impression / Assessment and Plan / UC Course  I have reviewed the triage vital signs and the nursing notes.  Pertinent labs & imaging results that were available during my care of the patient were reviewed by me and considered in my medical decision making (see chart for details).   Patient treated for sinus infection based on presentation. Begin Augmentin and Flonase. Continue Mucinex D. Advised patient when to f/u.  Final Clinical Impressions(s) / UC Diagnoses   Final diagnoses:  Acute maxillary sinusitis, recurrence not specified  Nasal congestion     Discharge Instructions     SINUSITIS: Reviewed use of a nasal saline irrigation system. May consider use of intranasal steroids, such as Flonase as well. Use medications as directed. If antibiotics are prescribed, take the full course of antibiotics. Also consider use of Sudafed or Mucinex D. Increase rest, fluids. If you do not improve or if you worsen after a course of antibiotics, you should be re-examined. You may need a different antibiotic or further evaluation with imaging or an exam of the inside of the sinuses     ED Prescriptions    Medication Sig Dispense Auth. Provider   amoxicillin (AMOXIL) 875 MG tablet Take 1 tablet (875 mg total) by mouth  2 (two) times daily for 10 days. 20 tablet Laurene Footman B, PA-C   fluticasone (FLONASE) 50 MCG/ACT nasal spray Place 1 spray into both nostrils 2 (two) times daily for 10 days. 1 g Danton Clap, PA-C     Controlled Substance Prescriptions [place] Controlled Substance Registry consulted? Not Applicable   Gretta Cool 01/29/18 1120

## 2018-01-29 NOTE — ED Triage Notes (Signed)
Patient c/o sinus infection x 2 weeks.

## 2018-02-27 ENCOUNTER — Ambulatory Visit: Payer: 59 | Attending: Internal Medicine

## 2018-02-27 DIAGNOSIS — R293 Abnormal posture: Secondary | ICD-10-CM | POA: Insufficient documentation

## 2018-02-27 DIAGNOSIS — M25511 Pain in right shoulder: Secondary | ICD-10-CM | POA: Insufficient documentation

## 2018-02-27 NOTE — Therapy (Signed)
[place] [facility]IN [facility] [address] [place], [place], [address] Phone: [phone]   Fax:  [phone]  Patient Details  Name: Stephanie Henderson MRN: [id number] Date of Birth: [birth year] Referring Provider:  Crecencio Mc, MD  Encounter Date: 02/27/2018  PT/OT/SLP Screening Form   Time: in_230 pm_____     Time out_251 pm____   Complaint _R shoulder pain____ Past Medical Hx:  _Cervical/posture PT performed in 2017-2018____ Injury Date:__08/2019_______ Pain Scale: _worst 7-8/10; current 3-4/10_ __________ Patient's phone number:   Hx (this occurrence):  Patient presented with R shoulder pain. Began in August and was sudden onset when raising arm, then intermittently, would go a few days without ntoicing, last couple weeks more noticeable. . Difficulty with horizontal adduction/abduction, reaching back behind head. Worst pain 8-9/10 and is sudden then achy for 5-10  Minutes afterwards. Been icing the past two days and feels a little better. Enjoys walking     Assessment:  ROM Standing: AROM  Right Left  Shoulder Flexion 139 pain posterior shoulder 162  Shoulder Abduction 119 pain posterior shoulder  166  ER Limited positionally by pain WFL  IR Limited positionally by pain WFL   Supine: PROM: full flexion ROM of RUE with pain at end range, pain of abduction at 90 degrees.   Mobilizations:   Forest joint grade I-III: AP hypomobile, Hard end feel   PA mobilization: hypomobile empty end feel   Inferior mobilization: hypomobile: empty end feel   Distraction: painful with guarding, less painful when relaxed  AC joint: grade I-III  inferior glide: hypomobile painful                       AP glide: hypomobile, painful  Strength:  Right Left  Shoulder Flexion 4/5 4+/5  Shoulder extension 4-/5 pain 4+/5  Shoulder shrug 4-/5 4+/5  Shoulder Abduction 4-/5 pain 4+/5  Biceps 4/5 4+/5  Triceps 4/5 4+/5     Observation:  Scapular  dyskinesia (RUE) with flexion and abduction;  FHRS posture in seated and standing Popping and clicking of R GH joint with shoulder movements  Special Tests:   +Hawkins Merrilyn Puma RUE  +Neer Sign RUE  -Kim's test  -Jerk test  + Crank Test  + painful arc    Recommendations:    Comments: Patient presents with R shoulder pain, pain is not constant and occurs with movement, especially at end range. Patient has limited Active ROM with end range marked by pain in posterior aspect of shoulder and shoulder dyskenesia. Muscle testing limited by pain at this time with RUE weaker than LUE. The limited strength combined with ROM and special tests indicate a potential tendinitis of R GH resulting in possible impingement from inflammation.  Forward head rounded shoulder posture limit available mobility for GH joint, patient educated on postural cueing and given scapular retractions and pendulum swings as HEP. Due to patient's pain levels and early stage of dysfunction patient will benefit from referral to PT for R shoulder pain.   HEP: scapular retractions: 10x every 2 hours (set alarm at work) 5 second holds, pendulum swings when pain is occurring, posture correction and awareness when at work.   []  Patient would benefit from an MD referral [x]  Patient would benefit from a full PT/OT/ SLP evaluation and treatment. []  No intervention recommended at this time.  Janna Arch, PT, DPT   02/27/2018, 3:23 PM  [place] [facility] [address]  [place], [place], [address] Phone: [phone]   Fax:  [phone]

## 2018-03-01 ENCOUNTER — Other Ambulatory Visit: Payer: Self-pay | Admitting: Internal Medicine

## 2018-03-01 DIAGNOSIS — M25511 Pain in right shoulder: Principal | ICD-10-CM

## 2018-03-01 DIAGNOSIS — G8929 Other chronic pain: Secondary | ICD-10-CM

## 2018-03-22 ENCOUNTER — Encounter: Payer: Self-pay | Admitting: Physical Therapy

## 2018-03-22 ENCOUNTER — Ambulatory Visit: Payer: 59 | Attending: Internal Medicine | Admitting: Physical Therapy

## 2018-03-22 ENCOUNTER — Ambulatory Visit: Payer: Self-pay | Admitting: Physical Therapy

## 2018-03-22 DIAGNOSIS — G8929 Other chronic pain: Secondary | ICD-10-CM | POA: Insufficient documentation

## 2018-03-22 DIAGNOSIS — M25511 Pain in right shoulder: Secondary | ICD-10-CM | POA: Diagnosis not present

## 2018-03-22 NOTE — Therapy (Signed)
[facility] [address] [place], [address] Phone: [phone]   Fax:  [phone]  Physical Therapy Evaluation  Patient Details  Name: Stephanie Henderson MRN: [id number] Date of Birth: 1966-05-15 Referring Provider (PT): Derrel Nip MD   Encounter Date: 03/22/2018  PT End of Session - 03/22/18 1603    Visit Number  1    Number of Visits  17    Date for PT Re-Evaluation  05/17/18    PT Start Time  0300    PT Stop Time  0400    PT Time Calculation (min)  60 min    Activity Tolerance  Patient tolerated treatment well    Behavior During Therapy  [facility] for tasks assessed/performed       Past Medical History:  Diagnosis Date  . Fibroid   . GERD (gastroesophageal reflux disease) Aug 2012   with esophagitis by EGD  . Hematuria   . Hx of colonic polyps August 2012   repeat due 2016,  5 polyps 2009, clear 2012 Gustavo Lah)  . Menorrhagia    did not tolerate trial of ocps due to migraines  . Migraine headache   . Migraine syndrome    since age 89,  sporadic    Past Surgical History:  Procedure Laterality Date  . BREAST BIOPSY Right 2017   benign  . CYSTOSCOPY    . laparoscopy    . VAGINAL HYSTERECTOMY  6/13   with morcellation    There were no vitals filed for this visit.   Subjective Assessment - 03/22/18 1506    Subjective  Rt shoulder pain    Pertinent History  Patient is a 52 year old female with R shoulder pain that began August of this year when she took something out of her pocket and had intense lateral shoulder pain with "tingling". Patient reports pain was intermittent through Sept, but in Oct became pretty constant. Patient reports pain is sharp at the lateral shoulder, with a dull ache in ant and post shoulder, and that she is unable to sleep on that side. Patient reports most pain with "bringing her arm out to the side, with pain with behind the back motions as well. Patient reports shearing pain at  lateral shoulder, with dull ache in post and ant shoulder- occasssional hand "tingling" and forearm ache. Patient reported worst pain over the past week 8/10; best 3/10. Pt denies N/V, B&B, unexplained weight fluctuation, saddle paresthesia, fever, night sweats, or unrelenting night pain at this time.    Limitations  Lifting;House hold activities    How long can you sit comfortably?  unlimited    How long can you stand comfortably?  unlimited    How long can you walk comfortably?  unlimited    Diagnostic tests  none at this time    Currently in Pain?  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       AROM Shoulder flex L wnl R 116d w/ lateral shoulder pain Shoulder ext wnl Shoulder abd L wnl R 96d with lateral shoulder pain Shoulder IR Lwnl  R L4 w/ lateral shoulder pain Shoulder ER wnl  Gross cervical AROM wnl  PROM Shoulder flex L- R 144d Shoulder ext wnl b/l Shoulder abd L wnl R 91d Shoulder IR Lwnl R 44d Shoulder ER L wnl R 32d Cervical motions wnl  Strength Shoulder flex L 5/5 R 4/5 Shoulder ext L 5/5  R 5/5 with pain at lateral shoulder Shoulder abd L 5/5 R 4+/5 with lateral shoulder pain with R Shoulder IR 5/5 with lateral shoulder pain on R Shoulder ER L 4+/5 R 4/5 Grip Strength: L 40lbs R 48lbs   Special Tests/Other UE Reflexes (-) Spurlings b/l (+) Michel Bickers on R (+) Neers on R (+) Painful Arc @ 96d on R (-) Empty can b/l (-) ER lag sign b/l (+) Lift off test on R (+) Apprehension  (-) Relocation (-) Speeds (-) Yergason's (-) O'Briens () Median Nerve (-) Roos  (-) Adson (-) Wright TTP wit noted trigger points in middle deltoid as well as pec minor/major  Posture Rounded head, forward shoulders; upper crossed  Ther-Ex - Attempted doorway pec stretch; patient unable to obtain proper stretch sensation without UE pain - Supine pec stretch with towel roll 30sec hold (HEP) - Supine IR gravity dependent stretch 30sec hold (HEP) - Pulleys AAROM abd and flex x15 each 3 sec hold (HEP) - Review of scap retractions and pendulum exercise given at PT screen - Education on pain science and hypersensitivity        .    [facility] PT Assessment - 03/22/18 0001      Assessment   Medical Diagnosis  Rt shoulder pain    Referring Provider (PT)  Derrel Nip MD    Onset Date/Surgical Date  12/20/17    Hand Dominance  Right    Next MD Visit  Not yet    Prior Therapy  Yes      Balance Screen   Has the patient fallen in the past 6 months  No    Has the patient had a decrease in activity level because of a fear of falling?   No    Is the patient reluctant to leave their home because of a fear of falling?   No      Home Environment   Living Environment  Private residence    Living Arrangements  Spouse/significant other    Available Help at Discharge  Family    Type of [place]um to enter    Entrance Stairs-Number of Steps  1 step    Entrance Stairs-Rails  Right    Home Layout  Two level    Alternate Level Stairs-Number of Steps  12    Alternate Level Stairs-Rails  Left      Prior Function   Level of Independence  Independent    Vocation  Full time employment    Vocation Requirements  NICU nurse    Leisure  Travel with family      Sensation   Light Touch  Appears Intact                Objective measurements completed on examination: See above findings.  PT Short Term Goals - 03/22/18 2136      PT SHORT  TERM GOAL #1   Title  Pt will be independent with HEP in order to improve strength and balance in order to decrease fall risk and improve function at home and work    Time  4    Period  Weeks    Status  New        PT Long Term Goals - 03/22/18 2130      PT LONG TERM GOAL #1   Title  Patient will increase FOTO score to 71 to demonstrate predicted increase in functional mobility to complete ADLs    Baseline  03/22/18 56    Time  8    Period  Weeks    Status  New      PT LONG TERM GOAL #2   Title  Patient will demonstrate symmetrical, full gross shoulder AROM in order to return to PLOF and complete ADLs    Baseline  03/22/18: L wnl all motions R: flex 116d ; abd 96d ; IR L4 ;    Time  8    Period  Weeks    Status  New      PT LONG TERM GOAL #3   Title  Patient will demonstrate gross shoulder strength 4+/5 or better to return to PLOF, and to be able to safely complete     Baseline  Deficits in R ER 4/5 ; flex 4/5 ; abd 4+/5    Time  8    Period  Weeks    Status  New      PT LONG TERM GOAL #4   Title  Pt will decrease worst pain as reported on NPRS by at least 3 points in order to demonstrate clinically significant reduction in pain.    Baseline  03/22/18 7/10    Time  8    Period  Weeks    Status  New             Plan - 03/22/18 2141    Clinical Impression Statement  Patient is a 52 year old female presenting with R shoulder pain (signs and symptoms of adhesive capsulitis) with insidious onset worsening over the past 2 months. Patient with impairments in R shoulder P/AROM, R shoulder strength, and pain. Activity limitations in lifting, overhead activity, dressing/bathing; currently inhibiting patient's participation in ADLs and with job tasks as a Press photographer. Would benefit from skilled PT to address above deficits and promote optimal return to PLOF    Clinical Presentation  Evolving    Clinical Presentation due to:  2 personal factors/comorbidities, 3 body systems/activity  limitations/participation restrictions     Clinical Decision Making  Moderate    Rehab Potential  Good    Clinical Impairments Affecting Rehab Potential  (+) fairly yong age, motivation (-) Sedentary lifestyle, chronicity of pain, hypersensitivity, psycho-social factors    PT Frequency  2x / week    PT Duration  8 weeks    PT Treatment/Interventions  Moist Heat;Traction;Ultrasound;Cryotherapy;Fluidtherapy;Electrical Stimulation;Therapeutic exercise;Taping;Patient/family education;Therapeutic activities;Functional mobility training;Neuromuscular re-education;Passive range of motion;Dry needling;Manual techniques    PT Next Visit Plan  HEP and goal review; manual techniques for soft tissue restriction/mobility, postural re-training    PT Home Exercise Plan  Supine pec stretch, supine IR stretch, pulleys AAROM flex/abd, scapular retractions    Consulted and Agree with Plan of Care  Patient       Patient will benefit from skilled therapeutic intervention in order  to improve the following deficits and impairments:  Improper body mechanics, Pain, Postural dysfunction, Impaired tone, Decreased activity tolerance, Decreased endurance, Decreased range of motion, Decreased strength, Impaired UE functional use  Visit Diagnosis: Chronic right shoulder pain     Problem List Patient Active Problem List   Diagnosis Date Noted  . Raynaud phenomenon 05/28/2017  . Generalized anxiety disorder 05/29/2016  . S/P hysterectomy 05/29/2016  . Headache 04/14/2016  . History of mammogram 03/02/2011  . Migraine syndrome   . Hx of colonic polyps   . GERD (gastroesophageal reflux disease) 12/16/2010   Shelton Silvas PT, DPT Shelton Silvas 03/22/2018, 9:59 PM  [facility] [address], [place], [address] Phone: [phone]   Fax:  [phone]  Name: Stephanie Henderson MRN: [id number] Date of Birth: [birth year]

## 2018-03-27 ENCOUNTER — Encounter: Payer: Self-pay | Admitting: Physical Therapy

## 2018-03-27 ENCOUNTER — Ambulatory Visit: Payer: 59 | Admitting: Physical Therapy

## 2018-03-27 DIAGNOSIS — G8929 Other chronic pain: Secondary | ICD-10-CM

## 2018-03-27 DIAGNOSIS — M25511 Pain in right shoulder: Principal | ICD-10-CM

## 2018-03-27 NOTE — Therapy (Signed)
[facility] [address] [place], [address] Phone: [phone]   Fax:  [phone]  Physical Therapy Treatment  Patient Details  Name: Stephanie Henderson MRN: [id number] Date of Birth: [birth year] Referring Provider (PT): Derrel Nip MD   Encounter Date: 03/27/2018  PT End of Session - 03/27/18 1537    Visit Number  2    Number of Visits  17    Date for PT Re-Evaluation  05/17/18    PT Start Time  0330    PT Stop Time  0415    PT Time Calculation (min)  45 min    Activity Tolerance  Patient tolerated treatment well    Behavior During Therapy  [facility] for tasks assessed/performed       Past Medical History:  Diagnosis Date  . Fibroid   . GERD (gastroesophageal reflux disease) Aug 2012   with esophagitis by EGD  . Hematuria   . Hx of colonic polyps August 2012   repeat due 2016,  5 polyps 2009, clear 2012 Gustavo Lah)  . Menorrhagia    did not tolerate trial of ocps due to migraines  . Migraine headache   . Migraine syndrome    since age 67,  sporadic    Past Surgical History:  Procedure Laterality Date  . BREAST BIOPSY Right 2017   benign  . CYSTOSCOPY    . laparoscopy    . VAGINAL HYSTERECTOMY  6/13   with morcellation    There were no vitals filed for this visit.  Subjective Assessment - 03/27/18 1534    Subjective  Patient reports her pain was decreased through Saturday, with increase Sunday (reports she may have overdone it Saturday because her "shoulder felt good"). Patient reports 4/10 pain today. Patient reports compliance with HEP.     Pertinent History  Patient is a 52 year old female with R shoulder pain that began August of this year when she took something out of her pocket and had intense lateral shoulder pain with "tingling". Patient reports pain was intermittent through Sept, but in Oct became pretty constant. Patient reports pain is sharp at the lateral shoulder, with a dull ache in ant and  post shoulder, and that she is unable to sleep on that side. Patient reports most pain with "bringing her arm out to the side, with pain with behind the back motions as well. Patient reports shearing pain at lateral shoulder, with dull ache in post and ant shoulder- occasssional hand "tingling" and forearm ache. Patient reported worst pain over the past week 8/10; best 3/10. Pt denies N/V, B&B, unexplained weight fluctuation, saddle paresthesia, fever, night sweats, or unrelenting night pain at this time.    Limitations  Lifting;House hold activities    How long can you sit comfortably?  unlimited    How long can you stand comfortably?  unlimited    How long can you walk comfortably?  unlimited    Diagnostic tests  none at this time    Pain Onset  More than a month ago       Ther-Ex - Pulleys x20 flexion; x20 abd with min cuing for 3sec hold  - UE ranger 2x 8 clockwise/counterclockwise circles with min cuing for full available ROM - Seated row red tband x10 with min cuing initially with good carry over (updated HEP to progress scap retractions to this)  Manual - GHJ AP mob grade II 8 bouts for pain relief; increased to grade III  8 bouts with IR for inc ROM (30sec bouts)     ESTIM + heat pack [employer] ESTIM 12 min at patient tolerated 65V > 70V at lateral shoulder and 75V to 80V at pec minor/major and bicep . Attempted to d/c muscle tension at these areas. With PT assessing patient tolerance throughout (increasing intensity as needed), monitoring skin integrity (normal), with decreased pain noted from patient. Utilized time to verbally review HEP                         PT Education - 03/27/18 1536    Education Details  Exercise form; ESTIM education    Person(s) Educated  Patient    Methods  Explanation;Demonstration;Verbal cues    Comprehension  Verbalized understanding;Returned demonstration;Verbal cues required       PT Short Term Goals - 03/22/18 2136      PT  SHORT TERM GOAL #1   Title  Pt will be independent with HEP in order to improve strength and balance in order to decrease fall risk and improve function at home and work    Time  4    Period  Weeks    Status  New        PT Long Term Goals - 03/22/18 2130      PT LONG TERM GOAL #1   Title  Patient will increase FOTO score to 71 to demonstrate predicted increase in functional mobility to complete ADLs    Baseline  03/22/18 56    Time  8    Period  Weeks    Status  New      PT LONG TERM GOAL #2   Title  Patient will demonstrate symmetrical, full gross shoulder AROM in order to return to PLOF and complete ADLs    Baseline  03/22/18: L wnl all motions R: flex 116d ; abd 96d ; IR L4 ;    Time  8    Period  Weeks    Status  New      PT LONG TERM GOAL #3   Title  Patient will demonstrate gross shoulder strength 4+/5 or better to return to PLOF, and to be able to safely complete     Baseline  Deficits in R ER 4/5 ; flex 4/5 ; abd 4+/5    Time  8    Period  Weeks    Status  New      PT LONG TERM GOAL #4   Title  Pt will decrease worst pain as reported on NPRS by at least 3 points in order to demonstrate clinically significant reduction in pain.    Baseline  03/22/18 7/10    Time  8    Period  Weeks    Status  New            Plan - 03/27/18 1651    Clinical Impression Statement  Patient is able to complete therex with accuracy and proper muscle activation without compensation following PT cuing. Patient reports no pain following manual and modality techniques, which she is very pleased with. PT reviewed HEP with patient with update of red tband for seated rows in place of scapular retractions. Pt verbalized and demonstrated understanding of all provided education and HEP update.     Rehab Potential  Good    Clinical Impairments Affecting Rehab Potential  (+) fairly yong age, motivation (-) Sedentary lifestyle, chronicity of pain, hypersensitivity, psycho-social factors    PT  Frequency  2x / week    PT Duration  8 weeks    PT Treatment/Interventions  Moist Heat;Traction;Ultrasound;Cryotherapy;Fluidtherapy;Electrical Stimulation;Therapeutic exercise;Taping;Patient/family education;Therapeutic activities;Functional mobility training;Neuromuscular re-education;Passive range of motion;Dry needling;Manual techniques    PT Next Visit Plan  HEP and goal review; manual techniques for soft tissue restriction/mobility, postural re-training    PT Home Exercise Plan  Supine pec stretch, supine IR stretch, pulleys AAROM flex/abd, scapular retractions    Consulted and Agree with Plan of Care  Patient       Patient will benefit from skilled therapeutic intervention in order to improve the following deficits and impairments:  Improper body mechanics, Pain, Postural dysfunction, Impaired tone, Decreased activity tolerance, Decreased endurance, Decreased range of motion, Decreased strength, Impaired UE functional use  Visit Diagnosis: Chronic right shoulder pain     Problem List Patient Active Problem List   Diagnosis Date Noted  . Raynaud phenomenon 05/28/2017  . Generalized anxiety disorder 05/29/2016  . S/P hysterectomy 05/29/2016  . Headache 04/14/2016  . History of mammogram 03/02/2011  . Migraine syndrome   . Hx of colonic polyps   . GERD (gastroesophageal reflux disease) 12/16/2010   Shelton Silvas PT, DPT Shelton Silvas 03/27/2018, 4:54 PM  [facility] [address] [place], [address] Phone: [phone]   Fax:  [phone]  Name: Stephanie Henderson MRN: [id number] Date of Birth: April 21, 1966

## 2018-03-29 ENCOUNTER — Encounter: Payer: 59 | Admitting: Physical Therapy

## 2018-03-29 ENCOUNTER — Encounter: Payer: Self-pay | Admitting: Physical Therapy

## 2018-03-29 ENCOUNTER — Ambulatory Visit: Payer: 59 | Admitting: Physical Therapy

## 2018-03-29 DIAGNOSIS — G8929 Other chronic pain: Secondary | ICD-10-CM

## 2018-03-29 DIAGNOSIS — M25511 Pain in right shoulder: Secondary | ICD-10-CM | POA: Diagnosis not present

## 2018-03-29 NOTE — Therapy (Signed)
[facility] [address], [place], [address] Phone: [phone]   Fax:  [phone]  Physical Therapy Treatment  Patient Details  Name: Stephanie Henderson MRN: [id number] Date of Birth: 1965-12-29 Referring Provider (PT): Derrel Nip MD   Encounter Date: 03/29/2018  PT End of Session - 03/29/18 0808    Visit Number  3    Number of Visits  17    Date for PT Re-Evaluation  05/17/18    PT Start Time  0800    PT Stop Time  0840    PT Time Calculation (min)  40 min    Activity Tolerance  Patient tolerated treatment well    Behavior During Therapy  [facility] for tasks assessed/performed       Past Medical History:  Diagnosis Date  . Fibroid   . GERD (gastroesophageal reflux disease) Aug 2012   with esophagitis by EGD  . Hematuria   . Hx of colonic polyps August 2012   repeat due 2016,  5 polyps 2009, clear 2012 Gustavo Lah)  . Menorrhagia    did not tolerate trial of ocps due to migraines  . Migraine headache   . Migraine syndrome    since age 45,  sporadic    Past Surgical History:  Procedure Laterality Date  . BREAST BIOPSY Right 2017   benign  . CYSTOSCOPY    . laparoscopy    . VAGINAL HYSTERECTOMY  6/13   with morcellation    There were no vitals filed for this visit.  Subjective Assessment - 03/29/18 0805    Subjective  Patient reports good pain relief following session, which she was happy with. Patient reports that night she had trouble finding a comfortable position to sleep in d/t her shoulder being sore, but that the shoulder was "better" yesterday. Patient reports compliance with her HEP with no questions or concerns.     Pertinent History  Patient is a 52 year old female with R shoulder pain that began August of this year when she took something out of her pocket and had intense lateral shoulder pain with "tingling". Patient reports pain was intermittent through Sept, but in Oct became pretty constant.  Patient reports pain is sharp at the lateral shoulder, with a dull ache in ant and post shoulder, and that she is unable to sleep on that side. Patient reports most pain with "bringing her arm out to the side, with pain with behind the back motions as well. Patient reports shearing pain at lateral shoulder, with dull ache in post and ant shoulder- occasssional hand "tingling" and forearm ache. Patient reported worst pain over the past week 8/10; best 3/10. Pt denies N/V, B&B, unexplained weight fluctuation, saddle paresthesia, fever, night sweats, or unrelenting night pain at this time.    Limitations  Lifting;House hold activities    How long can you sit comfortably?  unlimited    How long can you stand comfortably?  unlimited    How long can you walk comfortably?  unlimited    Diagnostic tests  none at this time    Pain Onset  More than a month ago       Ther-Ex - Pulleys x20 flexion; x20 abd with min cuing for 3sec hold  - IR and ER stretch in supine 30sec hold each  Manual - GHJ AP mob grade II 8 bouts for pain relief; increased to grade III 8 bouts with IR for inc ROM (30sec bouts) -  STM with trigger point release to R teres minor, infrapinatus and supraspinatus     ESTIM+ heat packHiVolt ESTIM12 min at patient tolerated75V>70V at teres major/minor and 1855V to 80V at supraspinatus/infraspinatour. Attempted to d/c muscle tension at these areas, and to d/c pain, following good success last session. With PT assessing patient tolerance throughout (increasing intensity as needed), monitoring skin integrity (normal), with decreased pain noted from patient. Utilized time to verbally review HEP     PROM: IR: 35d ER: 32d Abd 94d                    PT Education - 03/29/18 0807    Education Details  Exercise form    Person(s) Educated  Patient    Methods  Explanation;Demonstration;Verbal cues    Comprehension  Verbalized understanding;Returned  demonstration;Verbal cues required       PT Short Term Goals - 03/22/18 2136      PT SHORT TERM GOAL #1   Title  Pt will be independent with HEP in order to improve strength and balance in order to decrease fall risk and improve function at home and work    Time  4    Period  Weeks    Status  New        PT Long Term Goals - 03/22/18 2130      PT LONG TERM GOAL #1   Title  Patient will increase FOTO score to 71 to demonstrate predicted increase in functional mobility to complete ADLs    Baseline  03/22/18 56    Time  8    Period  Weeks    Status  New      PT LONG TERM GOAL #2   Title  Patient will demonstrate symmetrical, full gross shoulder AROM in order to return to PLOF and complete ADLs    Baseline  03/22/18: L wnl all motions R: flex 116d ; abd 96d ; IR L4 ;    Time  8    Period  Weeks    Status  New      PT LONG TERM GOAL #3   Title  Patient will demonstrate gross shoulder strength 4+/5 or better to return to PLOF, and to be able to safely complete     Baseline  Deficits in R ER 4/5 ; flex 4/5 ; abd 4+/5    Time  8    Period  Weeks    Status  New      PT LONG TERM GOAL #4   Title  Pt will decrease worst pain as reported on NPRS by at least 3 points in order to demonstrate clinically significant reduction in pain.    Baseline  03/22/18 7/10    Time  8    Period  Weeks    Status  New            Plan - 03/29/18 1340    Clinical Impression Statement  Patient and PT discussed signs and symptoms of adhesive capsulitis with potential time frames for healing. Patient is presenting with deficits in adhesive capuslitis pattern ER>IR>abd without MOI. PT will continue to gauge pain response to treatment to determine efectiveness. Patient continues to report good pain relief at the end of session.     Clinical Decision Making  --    Rehab Potential  Good    Clinical Impairments Affecting Rehab Potential  (+) fairly yong age, motivation (-) Sedentary lifestyle, chronicity  of pain, hypersensitivity, psycho-social factors  PT Frequency  2x / week    PT Duration  8 weeks    PT Treatment/Interventions  Moist Heat;Traction;Ultrasound;Cryotherapy;Fluidtherapy;Electrical Stimulation;Therapeutic exercise;Taping;Patient/family education;Therapeutic activities;Functional mobility training;Neuromuscular re-education;Passive range of motion;Dry needling;Manual techniques    PT Next Visit Plan  HEP and goal review; manual techniques for soft tissue restriction/mobility, postural re-training    PT Home Exercise Plan  Supine pec stretch, supine IR stretch, pulleys AAROM flex/abd, scapular retractions    Consulted and Agree with Plan of Care  Patient       Patient will benefit from skilled therapeutic intervention in order to improve the following deficits and impairments:  Improper body mechanics, Pain, Postural dysfunction, Impaired tone, Decreased activity tolerance, Decreased endurance, Decreased range of motion, Decreased strength, Impaired UE functional use  Visit Diagnosis: Chronic right shoulder pain     Problem List Patient Active Problem List   Diagnosis Date Noted  . Raynaud phenomenon 05/28/2017  . Generalized anxiety disorder 05/29/2016  . S/P hysterectomy 05/29/2016  . Headache 04/14/2016  . History of mammogram 03/02/2011  . Migraine syndrome   . Hx of colonic polyps   . GERD (gastroesophageal reflux disease) 12/16/2010   Shelton Silvas PT, DPT Shelton Silvas 03/29/2018, 2:12 PM  [facility] [address] [place], [address] Phone: [phone]   Fax:  [phone]  Name: ARIETTA EISENSTEIN MRN: [id number] Date of Birth: [birth year]

## 2018-04-03 ENCOUNTER — Ambulatory Visit: Payer: 59 | Admitting: Physical Therapy

## 2018-04-04 ENCOUNTER — Encounter: Payer: Self-pay | Admitting: Physical Therapy

## 2018-04-04 ENCOUNTER — Ambulatory Visit: Payer: 59 | Admitting: Physical Therapy

## 2018-04-04 DIAGNOSIS — M25511 Pain in right shoulder: Secondary | ICD-10-CM | POA: Diagnosis not present

## 2018-04-04 DIAGNOSIS — G8929 Other chronic pain: Secondary | ICD-10-CM | POA: Diagnosis not present

## 2018-04-04 NOTE — Therapy (Signed)
[facility] [address] [place], [address] Phone: [phone]   Fax:  [phone]  Physical Therapy Treatment  Patient Details  Name: Stephanie Henderson MRN: [id number] Date of Birth: 06/08/1965 Referring Provider (PT): Derrel Nip MD   Encounter Date: 04/04/2018  PT End of Session - 04/04/18 1401    Visit Number  4    Number of Visits  17    Date for PT Re-Evaluation  05/17/18    PT Start Time  0145    PT Stop Time  0230    PT Time Calculation (min)  45 min    Activity Tolerance  Patient tolerated treatment well    Behavior During Therapy  [facility] for tasks assessed/performed       Past Medical History:  Diagnosis Date  . Fibroid   . GERD (gastroesophageal reflux disease) Aug 2012   with esophagitis by EGD  . Hematuria   . Hx of colonic polyps August 2012   repeat due 2016,  5 polyps 2009, clear 2012 Gustavo Lah)  . Menorrhagia    did not tolerate trial of ocps due to migraines  . Migraine headache   . Migraine syndrome    since age 68,  sporadic    Past Surgical History:  Procedure Laterality Date  . BREAST BIOPSY Right 2017   benign  . CYSTOSCOPY    . laparoscopy    . VAGINAL HYSTERECTOMY  6/13   with morcellation    There were no vitals filed for this visit.  Subjective Assessment - 04/04/18 1349    Subjective  Patient reports over the weekend she was having some muscle spasms in the lateral aspect of the shoulder. Patient reports 4/10 pain today. Reports compliance with HEP without concern    Pertinent History  Patient is a 52 year old female with R shoulder pain that began August of this year when she took something out of her pocket and had intense lateral shoulder pain with "tingling". Patient reports pain was intermittent through Sept, but in Oct became pretty constant. Patient reports pain is sharp at the lateral shoulder, with a dull ache in ant and post shoulder, and that she is unable to  sleep on that side. Patient reports most pain with "bringing her arm out to the side, with pain with behind the back motions as well. Patient reports shearing pain at lateral shoulder, with dull ache in post and ant shoulder- occasssional hand "tingling" and forearm ache. Patient reported worst pain over the past week 8/10; best 3/10. Pt denies N/V, B&B, unexplained weight fluctuation, saddle paresthesia, fever, night sweats, or unrelenting night pain at this time.    Limitations  Lifting;House hold activities    How long can you sit comfortably?  unlimited    How long can you stand comfortably?  unlimited    How long can you walk comfortably?  unlimited    Diagnostic tests  none at this time    Pain Onset  More than a month ago       Ther-Ex - Pulleys x20 flexion; x20 abd with min cuing for 3sec hold   Manual - GHJ AP mob grade II 8 bouts for pain relief; increased to grade III 8 bouts with IR for inc ROM (30sec bouts) - STM with trigger point release to lateral deltoid, R teres minor, infrapinatus and supraspinatus  Following dry Needling: (4) 100mm .30 needles placed along the R teres minor/latissimus to decrease increased  muscular spasms and trigger points with the patient positioned in prone. Patient was educated on risks and benefits of therapy and verbally consents to PT.    ESTIM+ heat packHiVolt ESTIM53min at patient tolerated175V>70Vat teres major/minor and 1455V to 80V at lateral deltoid. Attemptedto d/c muscle tension at these areas, and to d/c pain, following good success last session. With PT assessing patient tolerance throughout (increasing intensity as needed), monitoring skin integrity (normal), with decreased pain noted from patient. Utilized time to verbalize post-TDN sensation and continuing stretching to prevent soreness.                          PT Education - 04/04/18 1353    Education Details  TDN education    Person(s) Educated   Patient    Methods  Explanation    Comprehension  Verbalized understanding       PT Short Term Goals - 03/22/18 2136      PT SHORT TERM GOAL #1   Title  Pt will be independent with HEP in order to improve strength and balance in order to decrease fall risk and improve function at home and work    Time  4    Period  Weeks    Status  New        PT Long Term Goals - 03/22/18 2130      PT LONG TERM GOAL #1   Title  Patient will increase FOTO score to 71 to demonstrate predicted increase in functional mobility to complete ADLs    Baseline  03/22/18 56    Time  8    Period  Weeks    Status  New      PT LONG TERM GOAL #2   Title  Patient will demonstrate symmetrical, full gross shoulder AROM in order to return to PLOF and complete ADLs    Baseline  03/22/18: L wnl all motions R: flex 116d ; abd 96d ; IR L4 ;    Time  8    Period  Weeks    Status  New      PT LONG TERM GOAL #3   Title  Patient will demonstrate gross shoulder strength 4+/5 or better to return to PLOF, and to be able to safely complete     Baseline  Deficits in R ER 4/5 ; flex 4/5 ; abd 4+/5    Time  8    Period  Weeks    Status  New      PT LONG TERM GOAL #4   Title  Pt will decrease worst pain as reported on NPRS by at least 3 points in order to demonstrate clinically significant reduction in pain.    Baseline  03/22/18 7/10    Time  8    Period  Weeks    Status  New            Plan - 04/04/18 1444    Clinical Impression Statement  During manual techniques, pt reports concordant sign with trigger pointing to teres minor/latissimus. PT utilized TDN to this area which increased patient's active IR from S1 to L1. Following manual techqniues + TDN PT utilized ESTIM, which patient continues to report pain relief from. PT will follow up on TDN tolerance next session.     Rehab Potential  Good    Clinical Impairments Affecting Rehab Potential  (+) fairly yong age, motivation (-) Sedentary lifestyle, chronicity  of pain, hypersensitivity, psycho-social factors  PT Frequency  2x / week    PT Duration  8 weeks    PT Treatment/Interventions  Moist Heat;Traction;Ultrasound;Cryotherapy;Fluidtherapy;Electrical Stimulation;Therapeutic exercise;Taping;Patient/family education;Therapeutic activities;Functional mobility training;Neuromuscular re-education;Passive range of motion;Dry needling;Manual techniques    PT Next Visit Plan  HEP and goal review; manual techniques for soft tissue restriction/mobility, postural re-training    PT Home Exercise Plan  Supine pec stretch, supine IR stretch, pulleys AAROM flex/abd, scapular retractions    Consulted and Agree with Plan of Care  Patient       Patient will benefit from skilled therapeutic intervention in order to improve the following deficits and impairments:  Improper body mechanics, Pain, Postural dysfunction, Impaired tone, Decreased activity tolerance, Decreased endurance, Decreased range of motion, Decreased strength, Impaired UE functional use  Visit Diagnosis: Chronic right shoulder pain     Problem List Patient Active Problem List   Diagnosis Date Noted  . Raynaud phenomenon 05/28/2017  . Generalized anxiety disorder 05/29/2016  . S/P hysterectomy 05/29/2016  . Headache 04/14/2016  . History of mammogram 03/02/2011  . Migraine syndrome   . Hx of colonic polyps   . GERD (gastroesophageal reflux disease) 12/16/2010   Shelton Silvas PT, DPT Shelton Silvas 04/04/2018, 2:49 PM  [facility] [address] [place], [address] Phone: [phone]   Fax:  [phone]  Name: Stephanie Henderson MRN: [id number] Date of Birth: Aug 12, 1965

## 2018-04-05 ENCOUNTER — Ambulatory Visit: Payer: 59 | Admitting: Physical Therapy

## 2018-04-06 ENCOUNTER — Ambulatory Visit: Payer: 59 | Admitting: Physical Therapy

## 2018-04-11 ENCOUNTER — Ambulatory Visit: Payer: 59 | Admitting: Physical Therapy

## 2018-04-11 ENCOUNTER — Encounter: Payer: Self-pay | Admitting: Physical Therapy

## 2018-04-11 DIAGNOSIS — M25511 Pain in right shoulder: Secondary | ICD-10-CM | POA: Diagnosis not present

## 2018-04-11 DIAGNOSIS — G8929 Other chronic pain: Secondary | ICD-10-CM | POA: Diagnosis not present

## 2018-04-11 NOTE — Therapy (Signed)
[facility] [address], [place], [address] Phone: [phone]   Fax:  [phone]  Physical Therapy Treatment  Patient Details  Name: Stephanie Henderson MRN: [id number] Date of Birth: 1965/12/02 Referring Provider (PT): Derrel Nip MD   Encounter Date: 04/11/2018  PT End of Session - 04/11/18 1725    Visit Number  5    Number of Visits  17    Date for PT Re-Evaluation  05/17/18    PT Start Time  0445    PT Stop Time  0530    PT Time Calculation (min)  45 min    Activity Tolerance  Patient tolerated treatment well    Behavior During Therapy  [facility] for tasks assessed/performed       Past Medical History:  Diagnosis Date  . Fibroid   . GERD (gastroesophageal reflux disease) Aug 2012   with esophagitis by EGD  . Hematuria   . Hx of colonic polyps August 2012   repeat due 2016,  5 polyps 2009, clear 2012 Gustavo Lah)  . Menorrhagia    did not tolerate trial of ocps due to migraines  . Migraine headache   . Migraine syndrome    since age 52,  sporadic    Past Surgical History:  Procedure Laterality Date  . BREAST BIOPSY Right 2017   benign  . CYSTOSCOPY    . laparoscopy    . VAGINAL HYSTERECTOMY  6/13   with morcellation    There were no vitals filed for this visit.  Subjective Assessment - 04/11/18 1651    Subjective  Patient reports decreased pain following TDN last session. Patient reports increased pain to 6/10 today after having to take an unexpected trip to [place] for her parents who are sick. Patietn reports pain increased following flying and having to complete physical labor there, and was unable to complete her HEP.     Pertinent History  Patient is a 52 year old female with R shoulder pain that began August of this year when she took something out of her pocket and had intense lateral shoulder pain with "tingling". Patient reports pain was intermittent through Sept, but in Oct became pretty constant.  Patient reports pain is sharp at the lateral shoulder, with a dull ache in ant and post shoulder, and that she is unable to sleep on that side. Patient reports most pain with "bringing her arm out to the side, with pain with behind the back motions as well. Patient reports shearing pain at lateral shoulder, with dull ache in post and ant shoulder- occasssional hand "tingling" and forearm ache. Patient reported worst pain over the past week 8/10; best 3/10. Pt denies N/V, B&B, unexplained weight fluctuation, saddle paresthesia, fever, night sweats, or unrelenting night pain at this time.    Limitations  Lifting;House hold activities    How long can you sit comfortably?  unlimited    How long can you stand comfortably?  unlimited    How long can you walk comfortably?  unlimited    Diagnostic tests  none at this time    Pain Onset  More than a month ago         Ther-Ex - Pulleys x20 flexion; x20 abd with min cuing for 3sec hold - Doorway pec stretch in 60d abd 30sec hold (HEP)  Manual - STM withtrigger point releaseto R lateral deltoid/ant deltoid, bicep muscle belly, R teres minor, infrapinatus and supraspinatus Following dry Needling: (2) 52mm .  30 needles placed along the R teres minor/latissimus with pincer grasp technique with the patient positioned in prone, and (2) 43mm .30 needles placed along the R lateral deltoid with pincer grasp technique to decrease increased muscular spasms and trigger points with the patient positioned in supine. Patient was educated on risks and benefits of therapy and verbally consents to PT.    ESTIM+ heat packHiVolt ESTIM99min at patient tolerated100V>105Vatteres major/minorand 100V atlateral deltoid. Attemptedto d/c muscle tension at these areas, and to d/c pain, following good success last session. With PT assessing patient tolerance throughout (increasing intensity as needed), monitoring skin integrity (normal), with decreased pain noted  from patient. During ESTIM patient positioned in 60d of abd for pec minor stretching after noted shoulder rounding off mat table in supine position 51min , transitioned to with towel under spine for increased stretch 20min. Educated patient on posture and ant tension creating maladaptive length-tension relationship of the shoulder.                       PT Education - 04/11/18 1653    Education Details  Continued TDN education, HEP review    Person(s) Educated  Patient    Methods  Explanation    Comprehension  Verbalized understanding       PT Short Term Goals - 03/22/18 2136      PT SHORT TERM GOAL #1   Title  Pt will be independent with HEP in order to improve strength and balance in order to decrease fall risk and improve function at home and work    Time  4    Period  Weeks    Status  New        PT Long Term Goals - 03/22/18 2130      PT LONG TERM GOAL #1   Title  Patient will increase FOTO score to 71 to demonstrate predicted increase in functional mobility to complete ADLs    Baseline  03/22/18 56    Time  8    Period  Weeks    Status  New      PT LONG TERM GOAL #2   Title  Patient will demonstrate symmetrical, full gross shoulder AROM in order to return to PLOF and complete ADLs    Baseline  03/22/18: L wnl all motions R: flex 116d ; abd 96d ; IR L4 ;    Time  8    Period  Weeks    Status  New      PT LONG TERM GOAL #3   Title  Patient will demonstrate gross shoulder strength 4+/5 or better to return to PLOF, and to be able to safely complete     Baseline  Deficits in R ER 4/5 ; flex 4/5 ; abd 4+/5    Time  8    Period  Weeks    Status  New      PT LONG TERM GOAL #4   Title  Pt will decrease worst pain as reported on NPRS by at least 3 points in order to demonstrate clinically significant reduction in pain.    Baseline  03/22/18 7/10    Time  8    Period  Weeks    Status  New            Plan - 04/11/18 1809    Clinical Impression  Statement  Patient reports some soreness, but decreased pain following manual + TDN. Patient is also demonstrates increased shoulder rounding  d/t increased pec minor tension. PT encouraged continued stretching, with addition of doorway stretch to maintain gains made in PT session. Pt verbalized understanding of all provided education.     Rehab Potential  Good    Clinical Impairments Affecting Rehab Potential  (+) fairly yong age, motivation (-) Sedentary lifestyle, chronicity of pain, hypersensitivity, psycho-social factors    PT Frequency  2x / week    PT Duration  8 weeks    PT Treatment/Interventions  Moist Heat;Traction;Ultrasound;Cryotherapy;Fluidtherapy;Electrical Stimulation;Therapeutic exercise;Taping;Patient/family education;Therapeutic activities;Functional mobility training;Neuromuscular re-education;Passive range of motion;Dry needling;Manual techniques    PT Next Visit Plan  HEP and goal review; manual techniques for soft tissue restriction/mobility, postural re-training    PT Home Exercise Plan  Supine pec stretch, supine IR stretch, pulleys AAROM flex/abd, scapular retractions    Consulted and Agree with Plan of Care  Patient       Patient will benefit from skilled therapeutic intervention in order to improve the following deficits and impairments:  Improper body mechanics, Pain, Postural dysfunction, Impaired tone, Decreased activity tolerance, Decreased endurance, Decreased range of motion, Decreased strength, Impaired UE functional use  Visit Diagnosis: Chronic right shoulder pain     Problem List Patient Active Problem List   Diagnosis Date Noted  . Raynaud phenomenon 05/28/2017  . Generalized anxiety disorder 05/29/2016  . S/P hysterectomy 05/29/2016  . Headache 04/14/2016  . History of mammogram 03/02/2011  . Migraine syndrome   . Hx of colonic polyps   . GERD (gastroesophageal reflux disease) 12/16/2010   Shelton Silvas PT, DPT Shelton Silvas 04/11/2018, 6:15  PM  [facility] [address] [place], [address] Phone: [phone]   Fax:  [phone]  Name: AVAIYAH STRUBEL MRN: [id number] Date of Birth: 1965-06-24

## 2018-04-12 ENCOUNTER — Encounter: Payer: Self-pay | Admitting: Physical Therapy

## 2018-04-17 ENCOUNTER — Encounter: Payer: Self-pay | Admitting: Physical Therapy

## 2018-04-17 ENCOUNTER — Ambulatory Visit: Payer: 59 | Attending: Internal Medicine | Admitting: Physical Therapy

## 2018-04-17 DIAGNOSIS — G8929 Other chronic pain: Secondary | ICD-10-CM | POA: Insufficient documentation

## 2018-04-17 DIAGNOSIS — M25511 Pain in right shoulder: Secondary | ICD-10-CM | POA: Diagnosis not present

## 2018-04-17 NOTE — Therapy (Signed)
[facility] [address] [place], [address] Phone: [phone]   Fax:  [phone]  Physical Therapy Treatment  Patient Details  Name: Stephanie Henderson MRN: [id number] Date of Birth: 06-02-1965 Referring Provider (PT): Derrel Nip MD   Encounter Date: 04/17/2018  PT End of Session - 04/17/18 1548    Visit Number  6    Number of Visits  17    Date for PT Re-Evaluation  05/17/18    PT Start Time  0315    PT Stop Time  0354    PT Time Calculation (min)  39 min    Activity Tolerance  Patient tolerated treatment well    Behavior During Therapy  [facility] for tasks assessed/performed       Past Medical History:  Diagnosis Date  . Fibroid   . GERD (gastroesophageal reflux disease) Aug 2012   with esophagitis by EGD  . Hematuria   . Hx of colonic polyps August 2012   repeat due 2016,  5 polyps 2009, clear 2012 Gustavo Lah)  . Menorrhagia    did not tolerate trial of ocps due to migraines  . Migraine headache   . Migraine syndrome    since age 69,  sporadic    Past Surgical History:  Procedure Laterality Date  . BREAST BIOPSY Right 2017   benign  . CYSTOSCOPY    . laparoscopy    . VAGINAL HYSTERECTOMY  6/13   with morcellation    There were no vitals filed for this visit.  Subjective Assessment - 04/17/18 1520    Subjective  Patient reports she was sore Wed following session, then "felt good", but then had some "tension" starting Saturday. Patient reports compliance with HEP with no questions or concerns. Patient reports she is not having any neck or back pain accompanying shoulder pain today, which she thinks is from being more conscious of how she sits/stands. Does report some tension localized to R UT.     Pertinent History  Patient is a 52 year old female with R shoulder pain that began August of this year when she took something out of her pocket and had intense lateral shoulder pain with "tingling". Patient  reports pain was intermittent through Sept, but in Oct became pretty constant. Patient reports pain is sharp at the lateral shoulder, with a dull ache in ant and post shoulder, and that she is unable to sleep on that side. Patient reports most pain with "bringing her arm out to the side, with pain with behind the back motions as well. Patient reports shearing pain at lateral shoulder, with dull ache in post and ant shoulder- occasssional hand "tingling" and forearm ache. Patient reported worst pain over the past week 8/10; best 3/10. Pt denies N/V, B&B, unexplained weight fluctuation, saddle paresthesia, fever, night sweats, or unrelenting night pain at this time.    Limitations  Lifting;House hold activities    How long can you sit comfortably?  unlimited    How long can you stand comfortably?  unlimited    How long can you walk comfortably?  unlimited    Diagnostic tests  none at this time    Pain Onset  More than a month ago       Ther-Ex - Pulleys x20 flexion; x20 abd with min cuing for 3sec hold - UTstretch 30sec hold (HEP) with education on preventing shoulder hiking posture for carry over as well.   Manual - STM withtrigger point  releasetoR lateral deltoid/ant deltoid, bicep muscle belly, R teres minor, infrapinatus and supraspinatus Following dry Needling: (2)20mm .30needles placed along the R teres minor/latissimuswith pincer grasp technique with the patient positioned in prone, (2)67mm .30needles placed along the R lateral deltoid with pincer grasp technique, and (2)73mm .30needles placed along the R UT with pincer grasp techniqueto decrease increased muscular spasms and trigger points with the patient positioned in supine. Good local twitch response noted. Patient was educated on risks and benefits of therapy and verbally consents to PT.Noted decreased shoulder height in supine following.    ESTIM+ heat packHiVolt ESTIM18min at patient tolerated125Vatteres  major/minorand 115V atlateral deltoid. Attemptedto d/c muscle tension at these areas, and to d/c pain, following good success last session. With PT assessing patient tolerance throughout (increasing intensity as needed), monitoring skin integrity (normal), with decreased pain noted from patient. Educated patient on importance of continuing neck stretching and proper posture mechanics as well to continue reduced pain in neck and mid back, and the anatomy of the upper chain connection                        PT Education - 04/17/18 1548    Education Details  TDN education; postural education carry over    Person(s) Educated  Patient    Methods  Explanation;Verbal cues    Comprehension  Verbalized understanding;Verbal cues required       PT Short Term Goals - 03/22/18 2136      PT SHORT TERM GOAL #1   Title  Pt will be independent with HEP in order to improve strength and balance in order to decrease fall risk and improve function at home and work    Time  4    Period  Weeks    Status  New        PT Long Term Goals - 03/22/18 2130      PT LONG TERM GOAL #1   Title  Patient will increase FOTO score to 71 to demonstrate predicted increase in functional mobility to complete ADLs    Baseline  03/22/18 56    Time  8    Period  Weeks    Status  New      PT LONG TERM GOAL #2   Title  Patient will demonstrate symmetrical, full gross shoulder AROM in order to return to PLOF and complete ADLs    Baseline  03/22/18: L wnl all motions R: flex 116d ; abd 96d ; IR L4 ;    Time  8    Period  Weeks    Status  New      PT LONG TERM GOAL #3   Title  Patient will demonstrate gross shoulder strength 4+/5 or better to return to PLOF, and to be able to safely complete     Baseline  Deficits in R ER 4/5 ; flex 4/5 ; abd 4+/5    Time  8    Period  Weeks    Status  New      PT LONG TERM GOAL #4   Title  Pt will decrease worst pain as reported on NPRS by at least 3 points in  order to demonstrate clinically significant reduction in pain.    Baseline  03/22/18 7/10    Time  8    Period  Weeks    Status  New            Plan - 04/17/18 1713  Clinical Impression Statement  Patient with good localized twitch responses during TDN with reported tension relief following. PT educated patient on posture and UT stretch for carry over following tension relief with decreased shoulder height. Patient verbalized understanding of all education. Patient will continue to benefit from skilled PT to inc ROM and for pain management.     Rehab Potential  Good    Clinical Impairments Affecting Rehab Potential  (+) fairly yong age, motivation (-) Sedentary lifestyle, chronicity of pain, hypersensitivity, psycho-social factors    PT Frequency  2x / week    PT Duration  8 weeks    PT Treatment/Interventions  Moist Heat;Traction;Ultrasound;Cryotherapy;Fluidtherapy;Electrical Stimulation;Therapeutic exercise;Taping;Patient/family education;Therapeutic activities;Functional mobility training;Neuromuscular re-education;Passive range of motion;Dry needling;Manual techniques    PT Next Visit Plan  HEP and goal review; manual techniques for soft tissue restriction/mobility, postural re-training    PT Home Exercise Plan  Supine pec stretch, supine IR stretch, pulleys AAROM flex/abd, scapular retractions    Consulted and Agree with Plan of Care  Patient       Patient will benefit from skilled therapeutic intervention in order to improve the following deficits and impairments:  Improper body mechanics, Pain, Postural dysfunction, Impaired tone, Decreased activity tolerance, Decreased endurance, Decreased range of motion, Decreased strength, Impaired UE functional use  Visit Diagnosis: Chronic right shoulder pain     Problem List Patient Active Problem List   Diagnosis Date Noted  . Raynaud phenomenon 05/28/2017  . Generalized anxiety disorder 05/29/2016  . S/P hysterectomy 05/29/2016   . Headache 04/14/2016  . History of mammogram 03/02/2011  . Migraine syndrome   . Hx of colonic polyps   . GERD (gastroesophageal reflux disease) 12/16/2010   Shelton Silvas PT, DPT Shelton Silvas 04/17/2018, 5:16 PM  [facility] [address] [place], [address] Phone: [phone]   Fax:  [phone]  Name: Stephanie Henderson MRN: [id number] Date of Birth: 04/14/1966

## 2018-04-20 ENCOUNTER — Ambulatory Visit: Payer: 59 | Admitting: Physical Therapy

## 2018-04-20 ENCOUNTER — Encounter: Payer: Self-pay | Admitting: Physical Therapy

## 2018-04-20 DIAGNOSIS — M25511 Pain in right shoulder: Secondary | ICD-10-CM | POA: Diagnosis not present

## 2018-04-20 DIAGNOSIS — G8929 Other chronic pain: Secondary | ICD-10-CM

## 2018-04-20 NOTE — Therapy (Signed)
[facility] [address] [place], [address] Phone: [phone]   Fax:  [phone]  Physical Therapy Treatment  Patient Details  Name: Stephanie Henderson MRN: [id number] Date of Birth: 22-Oct-1965 Referring Provider (PT): Derrel Nip MD   Encounter Date: 04/20/2018  PT End of Session - 04/20/18 1508    Visit Number  7    Number of Visits  17    Date for PT Re-Evaluation  05/17/18    PT Start Time  0245    PT Stop Time  0330    PT Time Calculation (min)  45 min    Activity Tolerance  Patient tolerated treatment well    Behavior During Therapy  [facility] for tasks assessed/performed       Past Medical History:  Diagnosis Date  . Fibroid   . GERD (gastroesophageal reflux disease) Aug 2012   with esophagitis by EGD  . Hematuria   . Hx of colonic polyps August 2012   repeat due 2016,  5 polyps 2009, clear 2012 Gustavo Lah)  . Menorrhagia    did not tolerate trial of ocps due to migraines  . Migraine headache   . Migraine syndrome    since age 89,  sporadic    Past Surgical History:  Procedure Laterality Date  . BREAST BIOPSY Right 2017   benign  . CYSTOSCOPY    . laparoscopy    . VAGINAL HYSTERECTOMY  6/13   with morcellation    There were no vitals filed for this visit.  Subjective Assessment - 04/20/18 1449    Subjective  Patient reports her shoulder good after last session, and was sore the day following. Patient reports she thinks her pain is worse at the end of the week. Reports 4/10 pain today.     Pertinent History  Patient is a 52 year old female with R shoulder pain that began August of this year when she took something out of her pocket and had intense lateral shoulder pain with "tingling". Patient reports pain was intermittent through Sept, but in Oct became pretty constant. Patient reports pain is sharp at the lateral shoulder, with a dull ache in ant and post shoulder, and that she is unable to sleep  on that side. Patient reports most pain with "bringing her arm out to the side, with pain with behind the back motions as well. Patient reports shearing pain at lateral shoulder, with dull ache in post and ant shoulder- occasssional hand "tingling" and forearm ache. Patient reported worst pain over the past week 8/10; best 3/10. Pt denies N/V, B&B, unexplained weight fluctuation, saddle paresthesia, fever, night sweats, or unrelenting night pain at this time.    Limitations  Lifting;House hold activities    How long can you sit comfortably?  unlimited    How long can you stand comfortably?  unlimited    How long can you walk comfortably?  unlimited    Diagnostic tests  none at this time    Pain Onset  More than a month ago       Ther-Ex - Pulleys x20 flexion; x20 abd with min cuing for 3sec hold - Prone I (shoulder ext + scap retraction) with 2sec hold 3x 10 with TC initially for proper form with good carry over following - Seated scapular retractions x10; adding in GTB resistance x12 with min cuing to decrease shoulder hiking to correct form for HEP - Education on completing scap retractions at work and  raise chair so shoulders are no hiked when working on computer  Manual - STM withtrigger point releasetoRlevator, R UT, R lateral deltoid  (2)15mm .30needles placed along theR UT with pincer grasp techniqueto decrease increased muscular spasms and trigger points with the patient positioned insupine.Good local twitch response noted. Patient was educated on risks and benefits of therapy and verbally consents to PT.Noted decreased shoulder height in supine following.    ESTIM+ heat packHiVolt ESTIM34min at patient tolerated130Vincreased to135V atlateral deltoid and UT/levator. Attemptedto d/c muscle tension at these areas, and to d/c pain, following good success last session. With PT assessing patient tolerance throughout (increasing intensity as needed), monitoring skin  integrity (normal), with decreased pain noted from patient.Discussion on phases of adhesive capsulitis during this time                        PT Education - 04/20/18 1453    Education Details  Postural ergonormics    Person(s) Educated  Patient    Methods  Explanation;Verbal cues;Tactile cues;Demonstration    Comprehension  Verbalized understanding;Verbal cues required;Returned demonstration       PT Short Term Goals - 03/22/18 2136      PT SHORT TERM GOAL #1   Title  Pt will be independent with HEP in order to improve strength and balance in order to decrease fall risk and improve function at home and work    Time  4    Period  Weeks    Status  New        PT Long Term Goals - 03/22/18 2130      PT LONG TERM GOAL #1   Title  Patient will increase FOTO score to 71 to demonstrate predicted increase in functional mobility to complete ADLs    Baseline  03/22/18 56    Time  8    Period  Weeks    Status  New      PT LONG TERM GOAL #2   Title  Patient will demonstrate symmetrical, full gross shoulder AROM in order to return to PLOF and complete ADLs    Baseline  03/22/18: L wnl all motions R: flex 116d ; abd 96d ; IR L4 ;    Time  8    Period  Weeks    Status  New      PT LONG TERM GOAL #3   Title  Patient will demonstrate gross shoulder strength 4+/5 or better to return to PLOF, and to be able to safely complete     Baseline  Deficits in R ER 4/5 ; flex 4/5 ; abd 4+/5    Time  8    Period  Weeks    Status  New      PT LONG TERM GOAL #4   Title  Pt will decrease worst pain as reported on NPRS by at least 3 points in order to demonstrate clinically significant reduction in pain.    Baseline  03/22/18 7/10    Time  8    Period  Weeks    Status  New            Plan - 04/21/18 1124    Clinical Impression Statement  Patient is continuing to report pain reduction. Patient is presenting to transition into "frozen stage" as she is having more  intermittent pain at this time. PT educated patient on staging and the importance of proper posture without shoulder hiking and increasing scapular depression activation/strength to  correct maladaptive posture and related pain. patient verblized understanding of all provided education and demonstrated accuracy with therex. Patient will continue to benefit from skilled PT for pain managemnt and ROM restoration.     Rehab Potential  Good    Clinical Impairments Affecting Rehab Potential  (+) fairly yong age, motivation (-) Sedentary lifestyle, chronicity of pain, hypersensitivity, psycho-social factors    PT Frequency  2x / week    PT Duration  8 weeks    PT Treatment/Interventions  Moist Heat;Traction;Ultrasound;Cryotherapy;Fluidtherapy;Electrical Stimulation;Therapeutic exercise;Taping;Patient/family education;Therapeutic activities;Functional mobility training;Neuromuscular re-education;Passive range of motion;Dry needling;Manual techniques    PT Next Visit Plan  HEP and goal review; manual techniques for soft tissue restriction/mobility, postural re-training    PT Home Exercise Plan  Supine pec stretch, supine IR stretch, pulleys AAROM flex/abd, scapular retractions    Consulted and Agree with Plan of Care  Patient       Patient will benefit from skilled therapeutic intervention in order to improve the following deficits and impairments:  Improper body mechanics, Pain, Postural dysfunction, Impaired tone, Decreased activity tolerance, Decreased endurance, Decreased range of motion, Decreased strength, Impaired UE functional use  Visit Diagnosis: Chronic right shoulder pain     Problem List Patient Active Problem List   Diagnosis Date Noted  . Raynaud phenomenon 05/28/2017  . Generalized anxiety disorder 05/29/2016  . S/P hysterectomy 05/29/2016  . Headache 04/14/2016  . History of mammogram 03/02/2011  . Migraine syndrome   . Hx of colonic polyps   . GERD (gastroesophageal reflux  disease) 12/16/2010   Shelton Silvas PT, DPT Shelton Silvas 04/21/2018, 11:28 AM  [facility] [address], [place], [address] Phone: [phone]   Fax:  [phone]  Name: Stephanie Henderson MRN: [id number] Date of Birth: 04-18-1966

## 2018-04-21 ENCOUNTER — Encounter: Payer: Self-pay | Admitting: Physical Therapy

## 2018-04-24 ENCOUNTER — Encounter: Payer: Self-pay | Admitting: Physical Therapy

## 2018-04-24 ENCOUNTER — Ambulatory Visit: Payer: 59 | Admitting: Physical Therapy

## 2018-04-24 DIAGNOSIS — G8929 Other chronic pain: Secondary | ICD-10-CM

## 2018-04-24 DIAGNOSIS — M25511 Pain in right shoulder: Principal | ICD-10-CM

## 2018-04-24 NOTE — Therapy (Addendum)
[place] [facility] [address], [place], [address] Phone: [phone]   Fax:  [phone]  Physical Therapy Treatment  Patient Details  Name: Stephanie Henderson MRN: [id number] Date of Birth: May 22, 1965 Referring Provider (PT): Derrel Nip MD   Encounter Date: 04/24/2018  PT End of Session - 04/24/18 2207    Visit Number  8    Number of Visits  17    Date for PT Re-Evaluation  05/17/18    PT Start Time  0315    PT Stop Time  0400    PT Time Calculation (min)  45 min    Activity Tolerance  Patient tolerated treatment well    Behavior During Therapy  [facility] for tasks assessed/performed       Past Medical History:  Diagnosis Date  . Fibroid   . GERD (gastroesophageal reflux disease) Aug 2012   with esophagitis by EGD  . Hematuria   . Hx of colonic polyps August 2012   repeat due 2016,  5 polyps 2009, clear 2012 Gustavo Lah)  . Menorrhagia    did not tolerate trial of ocps due to migraines  . Migraine headache   . Migraine syndrome    since age 80,  sporadic    Past Surgical History:  Procedure Laterality Date  . BREAST BIOPSY Right 2017   benign  . CYSTOSCOPY    . laparoscopy    . VAGINAL HYSTERECTOMY  6/13   with morcellation    There were no vitals filed for this visit.  Subjective Assessment - 04/24/18 1521    Subjective  Patient reports good pain relief since last session, which she is impressed/pleased by. Patient reports her motion may be a little better, but her pain is much better. Patient reports compliance with HEP with no questions or concerns.     Pertinent History  Patient is a 52 year old female with R shoulder pain that began August of this year when she took something out of her pocket and had intense lateral shoulder pain with "tingling". Patient reports pain was intermittent through Sept, but in Oct became pretty constant. Patient reports pain is sharp at the lateral shoulder, with a dull ache in  ant and post shoulder, and that she is unable to sleep on that side. Patient reports most pain with "bringing her arm out to the side, with pain with behind the back motions as well. Patient reports shearing pain at lateral shoulder, with dull ache in post and ant shoulder- occasssional hand "tingling" and forearm ache. Patient reported worst pain over the past week 8/10; best 3/10. Pt denies N/V, B&B, unexplained weight fluctuation, saddle paresthesia, fever, night sweats, or unrelenting night pain at this time.    Limitations  Lifting;House hold activities    How long can you sit comfortably?  unlimited    How long can you stand comfortably?  unlimited    How long can you walk comfortably?  unlimited    Diagnostic tests  none at this time    Pain Onset  More than a month ago       Ther-Ex - Pulleys x20 flexion; x20 abd with min cuing for 3sec hold - Demo with return demo of pendulum exercise for PROM; cuing to ensure true passive motion  Manual - STM withtrigger point releasetoRlevator, R UT, R lateral deltoid, and R pec minor (2/2/2)36mm .30needles placed along theRUT, R pec minor, and lateral deltoidwith pincer grasp techniqueto decrease increased muscular  spasms and trigger points with the patient positioned insupine.Good local twitch response noted.Patient was educated on risks and benefits of therapy and verbally consents to PT.Noted decreased shoulder height in supine following. - Multiple bouts of PROM into flex/abd/IR/ER (rotation at 60d of abd) with 5sec holds in max range of patient tolerance; severely limited  ESTIM+ heat packHiVolt ESTIM59min at patient tolerated105V atlateral deltoid and 140V UT/levator. Attemptedto d/c muscle tension at these areas, and to d/c pain, following good success last session. With PT assessing patient tolerance throughout (increasing intensity as needed), monitoring skin integrity (normal), with decreased pain noted from  patient.Discussion on phases of adhesive capsulitis during this time                         PT Education - 04/24/18 1524    Education Details  ROM education    Person(s) Educated  Patient    Methods  Explanation    Comprehension  Verbalized understanding       PT Short Term Goals - 03/22/18 2136      PT SHORT TERM GOAL #1   Title  Pt will be independent with HEP in order to improve strength and balance in order to decrease fall risk and improve function at home and work    Time  4    Period  Weeks    Status  New        PT Long Term Goals - 03/22/18 2130      PT LONG TERM GOAL #1   Title  Patient will increase FOTO score to 71 to demonstrate predicted increase in functional mobility to complete ADLs    Baseline  03/22/18 56    Time  8    Period  Weeks    Status  New      PT LONG TERM GOAL #2   Title  Patient will demonstrate symmetrical, full gross shoulder AROM in order to return to PLOF and complete ADLs    Baseline  03/22/18: L wnl all motions R: flex 116d ; abd 96d ; IR L4 ;    Time  8    Period  Weeks    Status  New      PT LONG TERM GOAL #3   Title  Patient will demonstrate gross shoulder strength 4+/5 or better to return to PLOF, and to be able to safely complete     Baseline  Deficits in R ER 4/5 ; flex 4/5 ; abd 4+/5    Time  8    Period  Weeks    Status  New      PT LONG TERM GOAL #4   Title  Pt will decrease worst pain as reported on NPRS by at least 3 points in order to demonstrate clinically significant reduction in pain.    Baseline  03/22/18 7/10    Time  8    Period  Weeks    Status  New            Plan - 04/24/18 2207    Clinical Impression Statement  Patient is reporting decreased pain between sessions, but has severe ROM deficits in adhesive capsulitis patern ER>abd>IR. PT educated patient to continue to attempt passive movement/stretching at the shoulder, adding the pendulum exercise, and continued education on  natural progression of adhesive capsulitis- pt verbalized and demonstrated understanding. Patient is continuing to demonstrate rounded shoulder with visual slight scapular winging this session. PT educated patient on scapular  winging d/t decreased strength/activation of periscapular musculature, and the importance of completing HEP and correcting posture as much as able. Patient with good localized twitch reponse to R UT/pec minor/lateral deltoid, with patient reporting "feeling looser" following. PT will continue to progress ROM as able, pain management techniques, and periscapular activation and strengthening.     Rehab Potential  Good    Clinical Impairments Affecting Rehab Potential  (+) fairly yong age, motivation (-) Sedentary lifestyle, chronicity of pain, hypersensitivity, psycho-social factors    PT Frequency  2x / week    PT Duration  8 weeks    PT Treatment/Interventions  Moist Heat;Traction;Ultrasound;Cryotherapy;Fluidtherapy;Electrical Stimulation;Therapeutic exercise;Taping;Patient/family education;Therapeutic activities;Functional mobility training;Neuromuscular re-education;Passive range of motion;Dry needling;Manual techniques    PT Next Visit Plan   manual techniques for soft tissue restriction/mobility, postural re-training, periscapular strengthening    PT Home Exercise Plan  Supine pec stretch, supine IR stretch, pulleys AAROM flex/abd, scapular retractions    Consulted and Agree with Plan of Care  Patient       Patient will benefit from skilled therapeutic intervention in order to improve the following deficits and impairments:  Improper body mechanics, Pain, Postural dysfunction, Impaired tone, Decreased activity tolerance, Decreased endurance, Decreased range of motion, Decreased strength, Impaired UE functional use  Visit Diagnosis: Chronic right shoulder pain     Problem List Patient Active Problem List   Diagnosis Date Noted  . Raynaud phenomenon 05/28/2017  .  Generalized anxiety disorder 05/29/2016  . S/P hysterectomy 05/29/2016  . Headache 04/14/2016  . History of mammogram 03/02/2011  . Migraine syndrome   . Hx of colonic polyps   . GERD (gastroesophageal reflux disease) 12/16/2010   Shelton Silvas PT, DPT Shelton Silvas 04/24/2018, 10:34 PM  [place] [facility] [address], [place], [address] Phone: [phone]   Fax:  [phone]  Name: Stephanie Henderson MRN: [id number] Date of Birth: [birth year]

## 2018-04-26 ENCOUNTER — Ambulatory Visit: Payer: 59 | Admitting: Physical Therapy

## 2018-04-26 ENCOUNTER — Encounter: Payer: Self-pay | Admitting: Physical Therapy

## 2018-04-26 DIAGNOSIS — M25511 Pain in right shoulder: Secondary | ICD-10-CM | POA: Diagnosis not present

## 2018-04-26 DIAGNOSIS — G8929 Other chronic pain: Secondary | ICD-10-CM

## 2018-04-26 NOTE — Therapy (Signed)
[place] [facility] [address], [place], [address] Phone: [phone]   Fax:  [phone]  Physical Therapy Treatment  Patient Details  Name: Stephanie Henderson MRN: [id number] Date of Birth: 02-29-1966 Referring Provider (PT): Derrel Nip MD   Encounter Date: 04/26/2018  PT End of Session - 04/26/18 0757    Visit Number  9    Number of Visits  17    Date for PT Re-Evaluation  05/17/18    PT Start Time  0730    PT Stop Time  0815    PT Time Calculation (min)  45 min    Activity Tolerance  Patient tolerated treatment well    Behavior During Therapy  [facility] for tasks assessed/performed       Past Medical History:  Diagnosis Date  . Fibroid   . GERD (gastroesophageal reflux disease) Aug 2012   with esophagitis by EGD  . Hematuria   . Hx of colonic polyps August 2012   repeat due 2016,  5 polyps 2009, clear 2012 Gustavo Lah)  . Menorrhagia    did not tolerate trial of ocps due to migraines  . Migraine headache   . Migraine syndrome    since age 52,  sporadic    Past Surgical History:  Procedure Laterality Date  . BREAST BIOPSY Right 2017   benign  . CYSTOSCOPY    . laparoscopy    . VAGINAL HYSTERECTOMY  6/13   with morcellation    There were no vitals filed for this visit.  Subjective Assessment - 04/26/18 0736    Subjective  Patient reports she is sore from last session at the UT, and lateral deltoid. Patient reports her pain is mostly just "soreness and tension" at ant shoulder; 3/10.     Pertinent History  Patient is a 52 year old female with R shoulder pain that began August of this year when she took something out of her pocket and had intense lateral shoulder pain with "tingling". Patient reports pain was intermittent through Sept, but in Oct became pretty constant. Patient reports pain is sharp at the lateral shoulder, with a dull ache in ant and post shoulder, and that she is unable to sleep on that side.  Patient reports most pain with "bringing her arm out to the side, with pain with behind the back motions as well. Patient reports shearing pain at lateral shoulder, with dull ache in post and ant shoulder- occasssional hand "tingling" and forearm ache. Patient reported worst pain over the past week 8/10; best 3/10. Pt denies N/V, B&B, unexplained weight fluctuation, saddle paresthesia, fever, night sweats, or unrelenting night pain at this time.    Limitations  Lifting;House hold activities    How long can you sit comfortably?  unlimited    How long can you stand comfortably?  unlimited    How long can you walk comfortably?  unlimited    Diagnostic tests  none at this time    Pain Onset  More than a month ago       Ther-Ex - Pulleys x20 flexion; x20 abd with min cuing for 3sec hold - Prone I (shoulder ext) with prior scap retraction x10; adding 1# DB 2x 10 with min TC initially for proper scap retraction - Standing rows GTB 3x 10 with cuing initially for proper resistance with good carry over - Review of UT stretch  Manual - STM withtrigger point releasetoRlevator, R UT, R lateral deltoid, and R pec  minor. - Multiple bouts of PROM into flex/abd/IR/ER (rotation at 60d of abd) with 5sec holds in max range of patient tolerance; severely limited - Manual cervical traction 10sec traction 10sec relax x10 to decrease pain  ESTIM+ heat packHiVolt ESTIM27min at patient tolerated140V atlateral deltoidand 175V UT/levator. Attemptedto d/c muscle tension at these areas, and to d/c pain, following good success last session. With PT assessing patient tolerance throughout (increasing intensity as needed), monitoring skin integrity (normal), with decreased pain noted from patient.Discussion of strengthening parameters and overload principle                        PT Education - 04/26/18 0756    Education Details  exercise technique    Person(s) Educated  Patient     Methods  Explanation;Tactile cues    Comprehension  Verbalized understanding;Tactile cues required       PT Short Term Goals - 03/22/18 2136      PT SHORT TERM GOAL #1   Title  Pt will be independent with HEP in order to improve strength and balance in order to decrease fall risk and improve function at home and work    Time  4    Period  Weeks    Status  New        PT Long Term Goals - 03/22/18 2130      PT LONG TERM GOAL #1   Title  Patient will increase FOTO score to 71 to demonstrate predicted increase in functional mobility to complete ADLs    Baseline  03/22/18 56    Time  8    Period  Weeks    Status  New      PT LONG TERM GOAL #2   Title  Patient will demonstrate symmetrical, full gross shoulder AROM in order to return to PLOF and complete ADLs    Baseline  03/22/18: L wnl all motions R: flex 116d ; abd 96d ; IR L4 ;    Time  8    Period  Weeks    Status  New      PT LONG TERM GOAL #3   Title  Patient will demonstrate gross shoulder strength 4+/5 or better to return to PLOF, and to be able to safely complete     Baseline  Deficits in R ER 4/5 ; flex 4/5 ; abd 4+/5    Time  8    Period  Weeks    Status  New      PT LONG TERM GOAL #4   Title  Pt will decrease worst pain as reported on NPRS by at least 3 points in order to demonstrate clinically significant reduction in pain.    Baseline  03/22/18 7/10    Time  8    Period  Weeks    Status  New            Plan - 04/26/18 1027    Clinical Impression Statement  Patient with increased soreness this session, TDN held.  Patient continues to report decreased pain following manual + modlaity techniques. PT progressed periscapular strengthening to prevent atrophy/scapular winging. PT educated patient on overload principle needed for strengthening gains, patient verbalized understanding.     Rehab Potential  Good    Clinical Impairments Affecting Rehab Potential  (+) fairly yong age, motivation (-) Sedentary  lifestyle, chronicity of pain, hypersensitivity, psycho-social factors    PT Frequency  2x / week    PT Duration  8 weeks    PT Treatment/Interventions  Moist Heat;Traction;Ultrasound;Cryotherapy;Fluidtherapy;Electrical Stimulation;Therapeutic exercise;Taping;Patient/family education;Therapeutic activities;Functional mobility training;Neuromuscular re-education;Passive range of motion;Dry needling;Manual techniques    PT Next Visit Plan   manual techniques for soft tissue restriction/mobility, postural re-training, periscapular strengthening    PT Home Exercise Plan  Supine pec stretch, supine IR stretch, pulleys AAROM flex/abd, scapular retractions    Consulted and Agree with Plan of Care  Patient       Patient will benefit from skilled therapeutic intervention in order to improve the following deficits and impairments:  Improper body mechanics, Pain, Postural dysfunction, Impaired tone, Decreased activity tolerance, Decreased endurance, Decreased range of motion, Decreased strength, Impaired UE functional use  Visit Diagnosis: Chronic right shoulder pain     Problem List Patient Active Problem List   Diagnosis Date Noted  . Raynaud phenomenon 05/28/2017  . Generalized anxiety disorder 05/29/2016  . S/P hysterectomy 05/29/2016  . Headache 04/14/2016  . History of mammogram 03/02/2011  . Migraine syndrome   . Hx of colonic polyps   . GERD (gastroesophageal reflux disease) 12/16/2010   Shelton Silvas PT, DPT, Shelton Silvas 04/26/2018, 10:33 AM  [place] [facility] [address], [place], [address] Phone: [phone]   Fax:  [phone]  Name: NAOMA BOXELL MRN: [id number] Date of Birth: Sep 30, 1965

## 2018-04-27 ENCOUNTER — Ambulatory Visit: Payer: 59 | Admitting: Physical Therapy

## 2018-05-01 ENCOUNTER — Ambulatory Visit: Payer: 59 | Admitting: Physical Therapy

## 2018-05-01 ENCOUNTER — Encounter: Payer: Self-pay | Admitting: Physical Therapy

## 2018-05-01 DIAGNOSIS — M25511 Pain in right shoulder: Principal | ICD-10-CM

## 2018-05-01 DIAGNOSIS — G8929 Other chronic pain: Secondary | ICD-10-CM

## 2018-05-01 NOTE — Therapy (Signed)
[place] [facility] [address] [place], [address] Phone: [phone]   Fax:  [phone]  Physical Therapy Treatment  Patient Details  Name: Stephanie Henderson MRN: [id number] Date of Birth: [birth year] Referring Provider (PT): Derrel Nip MD   Encounter Date: 05/01/2018  PT End of Session - 05/01/18 1549    Visit Number  10    Number of Visits  17    Date for PT Re-Evaluation  05/17/18    PT Start Time  0315    PT Stop Time  0400    PT Time Calculation (min)  45 min    Activity Tolerance  Patient tolerated treatment well    Behavior During Therapy  [facility] for tasks assessed/performed       Past Medical History:  Diagnosis Date  . Fibroid   . GERD (gastroesophageal reflux disease) Aug 2012   with esophagitis by EGD  . Hematuria   . Hx of colonic polyps August 2012   repeat due 2016,  5 polyps 2009, clear 2012 Gustavo Lah)  . Menorrhagia    did not tolerate trial of ocps due to migraines  . Migraine headache   . Migraine syndrome    since age 86,  sporadic    Past Surgical History:  Procedure Laterality Date  . BREAST BIOPSY Right 2017   benign  . CYSTOSCOPY    . laparoscopy    . VAGINAL HYSTERECTOMY  6/13   with morcellation    There were no vitals filed for this visit.  Subjective Assessment - 05/01/18 1517    Subjective  Patient reports she is having 3/10 pain today, but reports her pain does seem to be waxing and waning a little bit more, instead of constant high pain. Patient reports her motion continues to feel "pretty tight". Reports compliance with HEP.     Pertinent History  Patient is a 52 year old female with R shoulder pain that began August of this year when she took something out of her pocket and had intense lateral shoulder pain with "tingling". Patient reports pain was intermittent through Sept, but in Oct became pretty constant. Patient reports pain is sharp at the lateral shoulder, with a dull  ache in ant and post shoulder, and that she is unable to sleep on that side. Patient reports most pain with "bringing her arm out to the side, with pain with behind the back motions as well. Patient reports shearing pain at lateral shoulder, with dull ache in post and ant shoulder- occasssional hand "tingling" and forearm ache. Patient reported worst pain over the past week 8/10; best 3/10. Pt denies N/V, B&B, unexplained weight fluctuation, saddle paresthesia, fever, night sweats, or unrelenting night pain at this time.    Limitations  Lifting;House hold activities    How long can you sit comfortably?  unlimited    How long can you stand comfortably?  unlimited    How long can you walk comfortably?  unlimited    Diagnostic tests  none at this time    Pain Onset  More than a month ago          Ther-Ex - Pulleys x20 flexion; x20 abd with min cuing for 3sec hold -HEP review with education to continue periscapular strengthening at home as pain is currently decreased  Manual - STM withtrigger point releasetoRlevator, R UT, R lateral deltoid, and R pec minor. (3)33mm .30needles placed along theR lateral deltoidwith pincer grasp techniqueto decrease increased  muscular spasms and trigger points with the patient positioned insupine.Good local twitch response noted.Patient was educated on risks and benefits of therapy and verbally consents to PT. - Multiple bouts of PROM into flex/abd/IR/ER (rotation at 60d of abd) with 5sec holds in max range of patient tolerance; severely limited - Multiple bouts of PROM into flex/abd/IR/ER (rotation at 60d of abd) with 5sec holds in max range of patient tolerance; severely limited  ESTIM+ heat packHiVolt ESTIM30min at patient tolerated105V to 120VatR lateral deltoidand75Vto R UT. Attemptedto d/c muscle tension at these areas, and to d/c pain, following good success last session. With PT assessing patient tolerance throughout  (increasing intensity as needed), monitoring skin integrity (normal), with decreased pain noted from patient.Educated patient on pain as an experience with multiple factors, including stress/anxiety. Patient reports she feels as though her pain is more "when she is thinking about it". PT encouraged patient in in stress reduction techniques and their effectiveness to help reduce pain perception. PT continued to educate patient on probably adhesive capsulitis prognosis for encouragement, as patient reports she is more stressed "not knowing" her diagnosis. PT encouraged patient to return to PCP for diagnostics and further pain management as needed and patient reports she is seeing PCP 05/24/17  PROM measurements:  ER 30d IR 35d abd 70d                   PT Education - 05/01/18 1548    Education Details  Pain science    Person(s) Educated  Patient    Methods  Explanation    Comprehension  Verbalized understanding       PT Short Term Goals - 03/22/18 2136      PT SHORT TERM GOAL #1   Title  Pt will be independent with HEP in order to improve strength and balance in order to decrease fall risk and improve function at home and work    Time  4    Period  Weeks    Status  New        PT Long Term Goals - 03/22/18 2130      PT LONG TERM GOAL #1   Title  Patient will increase FOTO score to 71 to demonstrate predicted increase in functional mobility to complete ADLs    Baseline  03/22/18 56    Time  8    Period  Weeks    Status  New      PT LONG TERM GOAL #2   Title  Patient will demonstrate symmetrical, full gross shoulder AROM in order to return to PLOF and complete ADLs    Baseline  03/22/18: L wnl all motions R: flex 116d ; abd 96d ; IR L4 ;    Time  8    Period  Weeks    Status  New      PT LONG TERM GOAL #3   Title  Patient will demonstrate gross shoulder strength 4+/5 or better to return to PLOF, and to be able to safely complete     Baseline  Deficits in R ER 4/5  ; flex 4/5 ; abd 4+/5    Time  8    Period  Weeks    Status  New      PT LONG TERM GOAL #4   Title  Pt will decrease worst pain as reported on NPRS by at least 3 points in order to demonstrate clinically significant reduction in pain.    Baseline  03/22/18 7/10  Time  8    Period  Weeks    Status  New            Plan - 05/01/18 2125    Clinical Impression Statement  Patient is continuing to report good pain relief post session, and more localized pain than in previous sessions (no post shoulder pain to date). Patient is continuing to have difficulty with carry over of pain management over prolonged periods and relies on PT sessions for pain management. PT will continue POC for pain management as suspected adhesive capsulitis pattern is predictable in motion resistrictions, but did encourage patient to seek PCP for alternative pain management, and diagnostics. PT spent time educating patient on pain as an "experience" and the multiple factors that make up the pain "experience". Pt verbalized understanding of all education provided.     Rehab Potential  Good    Clinical Impairments Affecting Rehab Potential  (+) fairly yong age, motivation (-) Sedentary lifestyle, chronicity of pain, hypersensitivity, psycho-social factors    PT Frequency  2x / week    PT Treatment/Interventions  Moist Heat;Traction;Ultrasound;Cryotherapy;Fluidtherapy;Electrical Stimulation;Therapeutic exercise;Taping;Patient/family education;Therapeutic activities;Functional mobility training;Neuromuscular re-education;Passive range of motion;Dry needling;Manual techniques    PT Next Visit Plan   manual techniques for soft tissue restriction/mobility, postural re-training, periscapular strengthening    PT Home Exercise Plan  Supine pec stretch, supine IR stretch, pulleys AAROM flex/abd, scapular retractions    Consulted and Agree with Plan of Care  Patient       Patient will benefit from skilled therapeutic  intervention in order to improve the following deficits and impairments:  Improper body mechanics, Pain, Postural dysfunction, Impaired tone, Decreased activity tolerance, Decreased endurance, Decreased range of motion, Decreased strength, Impaired UE functional use  Visit Diagnosis: Chronic right shoulder pain     Problem List Patient Active Problem List   Diagnosis Date Noted  . Raynaud phenomenon 05/28/2017  . Generalized anxiety disorder 05/29/2016  . S/P hysterectomy 05/29/2016  . Headache 04/14/2016  . History of mammogram 03/02/2011  . Migraine syndrome   . Hx of colonic polyps   . GERD (gastroesophageal reflux disease) 12/16/2010   Shelton Silvas PT, DPT Shelton Silvas 05/01/2018, 9:30 PM  [place] [facility] [address], [place], [address] Phone: [phone]   Fax:  [phone]  Name: Stephanie Henderson MRN: [id number] Date of Birth: 1965/08/27

## 2018-05-03 ENCOUNTER — Telehealth: Payer: 59 | Admitting: Nurse Practitioner

## 2018-05-03 DIAGNOSIS — J0100 Acute maxillary sinusitis, unspecified: Secondary | ICD-10-CM | POA: Diagnosis not present

## 2018-05-03 MED ORDER — AMOXICILLIN-POT CLAVULANATE 875-125 MG PO TABS: 1.0000 | ORAL_TABLET | Freq: Two times a day (BID) | ORAL | 0 refills | Status: DC

## 2018-05-03 NOTE — Addendum Note (Signed)
Addended by: Chevis Pretty on: 05/03/2018 12:40 PM   Modules accepted: Orders

## 2018-05-03 NOTE — Progress Notes (Signed)

## 2018-05-04 ENCOUNTER — Ambulatory Visit: Payer: 59 | Admitting: Physical Therapy

## 2018-05-04 ENCOUNTER — Encounter: Payer: Self-pay | Admitting: Physical Therapy

## 2018-05-04 DIAGNOSIS — M25511 Pain in right shoulder: Principal | ICD-10-CM

## 2018-05-04 DIAGNOSIS — G8929 Other chronic pain: Secondary | ICD-10-CM | POA: Diagnosis not present

## 2018-05-04 NOTE — Therapy (Signed)
[facility] [address] [place], [address] Phone: [phone]   Fax:  [phone]  Physical Therapy Treatment  Patient Details  Name: DELANIA FERG MRN: [id number] Date of Birth: [birth year] Referring Provider (PT): Derrel Nip MD   Encounter Date: 05/04/2018  PT End of Session - 05/04/18 1557    Visit Number  11    Number of Visits  17    Date for PT Re-Evaluation  05/17/18    PT Start Time  0330    PT Stop Time  0415    PT Time Calculation (min)  45 min    Activity Tolerance  Patient tolerated treatment well    Behavior During Therapy  [facility] for tasks assessed/performed       Past Medical History:  Diagnosis Date  . Fibroid   . GERD (gastroesophageal reflux disease) Aug 2012   with esophagitis by EGD  . Hematuria   . Hx of colonic polyps August 2012   repeat due 2016,  5 polyps 2009, clear 2012 Gustavo Lah)  . Menorrhagia    did not tolerate trial of ocps due to migraines  . Migraine headache   . Migraine syndrome    since age 64,  sporadic    Past Surgical History:  Procedure Laterality Date  . BREAST BIOPSY Right 2017   benign  . CYSTOSCOPY    . laparoscopy    . VAGINAL HYSTERECTOMY  6/13   with morcellation    There were no vitals filed for this visit.  Subjective Assessment - 05/04/18 1534    Subjective  Patient reports she felt much better after TDN, though she did have some bruising. Patient reports 3/10 pain on the back side of the shoulder. Patient reports she has been able to sleep on the R shoulder, but that she slept on that side Tuesday night and yesterday was more painful following.     Pertinent History  Patient is a 52 year old female with R shoulder pain that began August of this year when she took something out of her pocket and had intense lateral shoulder pain with "tingling". Patient reports pain was intermittent through Sept, but in Oct became pretty constant. Patient reports  pain is sharp at the lateral shoulder, with a dull ache in ant and post shoulder, and that she is unable to sleep on that side. Patient reports most pain with "bringing her arm out to the side, with pain with behind the back motions as well. Patient reports shearing pain at lateral shoulder, with dull ache in post and ant shoulder- occasssional hand "tingling" and forearm ache. Patient reported worst pain over the past week 8/10; best 3/10. Pt denies N/V, B&B, unexplained weight fluctuation, saddle paresthesia, fever, night sweats, or unrelenting night pain at this time.    Limitations  Lifting;House hold activities    How long can you sit comfortably?  unlimited    How long can you stand comfortably?  unlimited    How long can you walk comfortably?  unlimited    Diagnostic tests  none at this time    Pain Onset  More than a month ago          Ther-Ex - Pulleys x20 flexion; x20 abd with min cuing for 3sec hold -Active Abd 2x 10  - Active IR and ER with 10sec hold   Manual - STM withtrigger point releasetoRlevator, R UT, and R medial deltoid. (3)64mm .30needles placed along theR  medial deltoidwith pincer grasp and R UT with pincer grasp techniqueto decrease increased muscular spasms and trigger points with the patient positioned insupine (deltoid) and prone (UT).Good local twitch response noted.Patient was educated on risks and benefits of therapy and verbally consents to PT. - Multiple bouts of PROM into flex/abd/IR/ER (rotation at 60d of abd) with 5sec holds in max range of patient tolerance; severely limited   ESTIM+ heat packHiVolt ESTIM31min at patient tolerated145V to 160VatR lateral deltoidand75Vto R levator. Attemptedto d/c muscle tension at these areas, and to d/c pain, following good success last session. With PT assessing patient tolerance throughout (increasing intensity as needed), monitoring skin integrity (normal), with decreased pain noted from  patient.  PROM measurements:  ER 45d  IR 52d  abd 89d   AROM measurements:  ER C7 IR  L5 abd  85                    PT Education - 05/04/18 1556    Education Details  TDN education; posture for decreasing muscle tension    Person(s) Educated  Patient    Methods  Explanation;Demonstration;Verbal cues    Comprehension  Verbalized understanding;Returned demonstration;Verbal cues required       PT Short Term Goals - 03/22/18 2136      PT SHORT TERM GOAL #1   Title  Pt will be independent with HEP in order to improve strength and balance in order to decrease fall risk and improve function at home and work    Time  4    Period  Weeks    Status  New        PT Long Term Goals - 03/22/18 2130      PT LONG TERM GOAL #1   Title  Patient will increase FOTO score to 71 to demonstrate predicted increase in functional mobility to complete ADLs    Baseline  03/22/18 56    Time  8    Period  Weeks    Status  New      PT LONG TERM GOAL #2   Title  Patient will demonstrate symmetrical, full gross shoulder AROM in order to return to PLOF and complete ADLs    Baseline  03/22/18: L wnl all motions R: flex 116d ; abd 96d ; IR L4 ;    Time  8    Period  Weeks    Status  New      PT LONG TERM GOAL #3   Title  Patient will demonstrate gross shoulder strength 4+/5 or better to return to PLOF, and to be able to safely complete     Baseline  Deficits in R ER 4/5 ; flex 4/5 ; abd 4+/5    Time  8    Period  Weeks    Status  New      PT LONG TERM GOAL #4   Title  Pt will decrease worst pain as reported on NPRS by at least 3 points in order to demonstrate clinically significant reduction in pain.    Baseline  03/22/18 7/10    Time  8    Period  Weeks    Status  New            Plan - 05/04/18 1602    Clinical Impression Statement  Patient reports decreased pain following manual + modality tehcniques with improved PROM at the end of session. Patient is able to  tolerate limited AROM, which is an improvement. Patient is still  limited in motion by pain/tension, but is improving slowly with her motion and pain management reporting less episodes of severe pain throughout the day. PT will follow up as scheduled for pain management and to increase motion as able.     Rehab Potential  Good    Clinical Impairments Affecting Rehab Potential  (+) fairly yong age, motivation (-) Sedentary lifestyle, chronicity of pain, hypersensitivity, psycho-social factors    PT Frequency  2x / week    PT Treatment/Interventions  Moist Heat;Traction;Ultrasound;Cryotherapy;Fluidtherapy;Electrical Stimulation;Therapeutic exercise;Taping;Patient/family education;Therapeutic activities;Functional mobility training;Neuromuscular re-education;Passive range of motion;Dry needling;Manual techniques    PT Next Visit Plan   manual techniques for soft tissue restriction/mobility, postural re-training, periscapular strengthening    PT Home Exercise Plan  Supine pec stretch, supine IR stretch, pulleys AAROM flex/abd, scapular retractions    Consulted and Agree with Plan of Care  Patient       Patient will benefit from skilled therapeutic intervention in order to improve the following deficits and impairments:  Improper body mechanics, Pain, Postural dysfunction, Impaired tone, Decreased activity tolerance, Decreased endurance, Decreased range of motion, Decreased strength, Impaired UE functional use  Visit Diagnosis: Chronic right shoulder pain     Problem List Patient Active Problem List   Diagnosis Date Noted  . Raynaud phenomenon 05/28/2017  . Generalized anxiety disorder 05/29/2016  . S/P hysterectomy 05/29/2016  . Headache 04/14/2016  . History of mammogram 03/02/2011  . Migraine syndrome   . Hx of colonic polyps   . GERD (gastroesophageal reflux disease) 12/16/2010   Shelton Silvas PT, DPT Shelton Silvas 05/04/2018, 5:50 PM  [facility] [address], [place], [address] Phone: [phone]   Fax:  [phone]  Name: TRISHA KEN MRN: [id number] Date of Birth: 1965-10-02

## 2018-05-08 ENCOUNTER — Ambulatory Visit: Payer: 59 | Admitting: Physical Therapy

## 2018-05-08 ENCOUNTER — Encounter: Payer: Self-pay | Admitting: Physical Therapy

## 2018-05-08 DIAGNOSIS — M25511 Pain in right shoulder: Secondary | ICD-10-CM | POA: Diagnosis not present

## 2018-05-08 DIAGNOSIS — G8929 Other chronic pain: Secondary | ICD-10-CM | POA: Diagnosis not present

## 2018-05-08 NOTE — Therapy (Signed)
[facility] [address] [place], [address] Phone: [phone]   Fax:  [phone]  Physical Therapy Treatment  Patient Details  Name: Stephanie Henderson MRN: [id number] Date of Birth: [birth year] Referring Provider (PT): Derrel Nip MD   Encounter Date: 05/08/2018  PT End of Session - 05/08/18 1544    Visit Number  12    Number of Visits  17    Date for PT Re-Evaluation  05/17/18    PT Start Time  0315    PT Stop Time  0400    PT Time Calculation (min)  45 min    Activity Tolerance  Patient tolerated treatment well    Behavior During Therapy  [facility] for tasks assessed/performed       Past Medical History:  Diagnosis Date  . Fibroid   . GERD (gastroesophageal reflux disease) Aug 2012   with esophagitis by EGD  . Hematuria   . Hx of colonic polyps August 2012   repeat due 2016,  5 polyps 2009, clear 2012 Gustavo Lah)  . Menorrhagia    did not tolerate trial of ocps due to migraines  . Migraine headache   . Migraine syndrome    since age 41,  sporadic    Past Surgical History:  Procedure Laterality Date  . BREAST BIOPSY Right 2017   benign  . CYSTOSCOPY    . laparoscopy    . VAGINAL HYSTERECTOMY  6/13   with morcellation    There were no vitals filed for this visit.  Subjective Assessment - 05/08/18 1518    Subjective  Patient reports her lateral shoulder pain is getting better, but that she has some post and UT pain. Patient reports continued pain with sleeping. Reports 3/10 pain today. Reports compliance with HEP with no questions or concerns.     Pertinent History  Patient is a 52 year old female with R shoulder pain that began August of this year when she took something out of her pocket and had intense lateral shoulder pain with "tingling". Patient reports pain was intermittent through Sept, but in Oct became pretty constant. Patient reports pain is sharp at the lateral shoulder, with a dull ache in  ant and post shoulder, and that she is unable to sleep on that side. Patient reports most pain with "bringing her arm out to the side, with pain with behind the back motions as well. Patient reports shearing pain at lateral shoulder, with dull ache in post and ant shoulder- occasssional hand "tingling" and forearm ache. Patient reported worst pain over the past week 8/10; best 3/10. Pt denies N/V, B&B, unexplained weight fluctuation, saddle paresthesia, fever, night sweats, or unrelenting night pain at this time.    Limitations  Lifting;House hold activities    How long can you sit comfortably?  unlimited    How long can you stand comfortably?  unlimited    How long can you walk comfortably?  unlimited    Pain Onset  More than a month ago         Ther-Ex - Pulleys x20 flexion; x20 abd with min cuing for 3sec hold -OMEGA standing rows 5# 3x 10 (attempted 10#, unable to complete with proper form) with demo and cuing initially for proper form - OMEGA lat pulldown 15# 2x 10 with cuing initially for proper form with good carry over following  Manual - STM withtrigger point releasetoRlevator, R UT, and R medial deltoid. (3)28mm .30needles placed along theRrhomboid  and R UT with pincer grasp techniqueto decrease increased muscular spasms and trigger points with the patient positioned inprone (UT).Good local twitch response noted.Patient was educated on risks and benefits of therapy and verbally consents to PT. - Multiple bouts of PROM into flex/abd/IR/ER (rotation at 60d of abd) with 5sec holds in max range of patient tolerance; severely limited   ESTIM+ heat packHiVolt ESTIM33min at patient tolerated755Vto 180VatRperiscapular musculature and UT. Attemptedto d/c muscle tension at these areas, and to d/c pain, following good success last session. With PT assessing patient tolerance throughout (increasing intensity as needed), monitoring skin integrity (normal), with  decreased pain noted from patient.                          PT Education - 05/08/18 1542    Education Details  Exercise form    Person(s) Educated  Patient    Methods  Explanation;Demonstration;Verbal cues    Comprehension  Verbalized understanding;Returned demonstration;Verbal cues required       PT Short Term Goals - 03/22/18 2136      PT SHORT TERM GOAL #1   Title  Pt will be independent with HEP in order to improve strength and balance in order to decrease fall risk and improve function at home and work    Time  4    Period  Weeks    Status  New        PT Long Term Goals - 03/22/18 2130      PT LONG TERM GOAL #1   Title  Patient will increase FOTO score to 71 to demonstrate predicted increase in functional mobility to complete ADLs    Baseline  03/22/18 56    Time  8    Period  Weeks    Status  New      PT LONG TERM GOAL #2   Title  Patient will demonstrate symmetrical, full gross shoulder AROM in order to return to PLOF and complete ADLs    Baseline  03/22/18: L wnl all motions R: flex 116d ; abd 96d ; IR L4 ;    Time  8    Period  Weeks    Status  New      PT LONG TERM GOAL #3   Title  Patient will demonstrate gross shoulder strength 4+/5 or better to return to PLOF, and to be able to safely complete     Baseline  Deficits in R ER 4/5 ; flex 4/5 ; abd 4+/5    Time  8    Period  Weeks    Status  New      PT LONG TERM GOAL #4   Title  Pt will decrease worst pain as reported on NPRS by at least 3 points in order to demonstrate clinically significant reduction in pain.    Baseline  03/22/18 7/10    Time  8    Period  Weeks    Status  New            Plan - 05/08/18 1658    Clinical Impression Statement  Patient is continuing to respond well to manual techniques, with reduced tension and pain noted after. PT progressed therex for periscapular/ downward scap rotators, which patient tolerated well with no increased pain, only noted  muscle fatigue. Patient requiring cuing for proper form with good carry over following.> PT will continue pain management/ inc ROM, and progress tehrex as able.     Rehab  Potential  Good    Clinical Impairments Affecting Rehab Potential  (+) fairly yong age, motivation (-) Sedentary lifestyle, chronicity of pain, hypersensitivity, psycho-social factors    PT Frequency  2x / week    PT Duration  8 weeks    PT Treatment/Interventions  Moist Heat;Traction;Ultrasound;Cryotherapy;Fluidtherapy;Electrical Stimulation;Therapeutic exercise;Taping;Patient/family education;Therapeutic activities;Functional mobility training;Neuromuscular re-education;Passive range of motion;Dry needling;Manual techniques    PT Next Visit Plan   manual techniques for soft tissue restriction/mobility, postural re-training, periscapular strengthening    PT Home Exercise Plan  Supine pec stretch, supine IR stretch, pulleys AAROM flex/abd, scapular retractions    Consulted and Agree with Plan of Care  Patient       Patient will benefit from skilled therapeutic intervention in order to improve the following deficits and impairments:  Improper body mechanics, Pain, Postural dysfunction, Impaired tone, Decreased activity tolerance, Decreased endurance, Decreased range of motion, Decreased strength, Impaired UE functional use  Visit Diagnosis: Chronic right shoulder pain     Problem List Patient Active Problem List   Diagnosis Date Noted  . Raynaud phenomenon 05/28/2017  . Generalized anxiety disorder 05/29/2016  . S/P hysterectomy 05/29/2016  . Headache 04/14/2016  . History of mammogram 03/02/2011  . Migraine syndrome   . Hx of colonic polyps   . GERD (gastroesophageal reflux disease) 12/16/2010   Shelton Silvas PT, DPT Shelton Silvas 05/08/2018, 5:05 PM  [facility] [address] [place], [address] Phone: [phone]   Fax:   [phone]  Name: Stephanie Henderson MRN: [id number] Date of Birth: June 22, 1965

## 2018-05-11 ENCOUNTER — Encounter: Payer: Self-pay | Admitting: Physical Therapy

## 2018-05-11 ENCOUNTER — Ambulatory Visit: Payer: 59 | Admitting: Physical Therapy

## 2018-05-11 DIAGNOSIS — M25511 Pain in right shoulder: Secondary | ICD-10-CM | POA: Diagnosis not present

## 2018-05-11 DIAGNOSIS — G8929 Other chronic pain: Secondary | ICD-10-CM | POA: Diagnosis not present

## 2018-05-11 NOTE — Therapy (Signed)
[facility] [address] [place], [address] Phone: [phone]   Fax:  [phone]  Physical Therapy Treatment  Patient Details  Name: Stephanie Henderson MRN: [id number] Date of Birth: April 20, 1966 Referring Provider (PT): Derrel Nip MD   Encounter Date: 05/11/2018  PT End of Session - 05/11/18 1650    Visit Number  13    Number of Visits  17    Date for PT Re-Evaluation  05/17/18    PT Start Time  0445    PT Stop Time  0530    PT Time Calculation (min)  45 min    Activity Tolerance  Patient tolerated treatment well    Behavior During Therapy  [facility] for tasks assessed/performed       Past Medical History:  Diagnosis Date  . Fibroid   . GERD (gastroesophageal reflux disease) Aug 2012   with esophagitis by EGD  . Hematuria   . Hx of colonic polyps August 2012   repeat due 2016,  5 polyps 2009, clear 2012 Gustavo Lah)  . Menorrhagia    did not tolerate trial of ocps due to migraines  . Migraine headache   . Migraine syndrome    since age 20,  sporadic    Past Surgical History:  Procedure Laterality Date  . BREAST BIOPSY Right 2017   benign  . CYSTOSCOPY    . laparoscopy    . VAGINAL HYSTERECTOMY  6/13   with morcellation    There were no vitals filed for this visit.  Subjective Assessment - 05/11/18 1648    Subjective  Patient reports her pain has been better after being "sore" Monday night after TDN. Patient reports 3.5/10 pain today. Reports compliance with HEP with no questions or concerns.     Pertinent History  Patient is a 52 year old female with R shoulder pain that began August of this year when she took something out of her pocket and had intense lateral shoulder pain with "tingling". Patient reports pain was intermittent through Sept, but in Oct became pretty constant. Patient reports pain is sharp at the lateral shoulder, with a dull ache in ant and post shoulder, and that she is unable to sleep  on that side. Patient reports most pain with "bringing her arm out to the side, with pain with behind the back motions as well. Patient reports shearing pain at lateral shoulder, with dull ache in post and ant shoulder- occasssional hand "tingling" and forearm ache. Patient reported worst pain over the past week 8/10; best 3/10. Pt denies N/V, B&B, unexplained weight fluctuation, saddle paresthesia, fever, night sweats, or unrelenting night pain at this time.    Limitations  Lifting;House hold activities    How long can you sit comfortably?  unlimited    How long can you stand comfortably?  unlimited    How long can you walk comfortably?  unlimited    Diagnostic tests  none at this time    Pain Onset  More than a month ago          Ther-Ex - Pulleys x20 flexion; x20 abd with min cuing for 3sec hold - Low rows with GTB 3x 10  -OMEGA standing high rows 5# 3x 8 with demo and cuing for proper form, patient able to complete at approx 70d of abd - OMEGA lat pulldown 15# 2x 10 with cuing initially for proper form with good carry over following  Manual - STM withtrigger point releaseto,  R UT,andRmedialdeltoid. (3)27mm .30needles placed along theRmiddle deltoid and R UT with pincer grasptechniqueto decrease increased muscular spasms and trigger points with the patient positioned inprone (UT).Good local twitch response noted.Patient was educated on risks and benefits of therapy and verbally consents to PT. - Multiple bouts of PROM into flex/abd/IR/ER (rotation at 60d of abd) with 5sec holds in max range of patient tolerance; severely limited   ESTIM+ heat packHiVolt ESTIM57min at patient tolerated110Vto 125Vatmiddle deltoid and UT. Attemptedto d/c muscle tension at these areas, and to d/c pain, following good success last session. With PT assessing patient tolerance throughout (increasing intensity as needed), monitoring skin integrity (normal), with decreased pain  noted from patient.                      PT Education - 05/11/18 1650    Education Details  Exercise form    Person(s) Educated  Patient    Methods  Explanation;Verbal cues    Comprehension  Verbalized understanding;Verbal cues required       PT Short Term Goals - 03/22/18 2136      PT SHORT TERM GOAL #1   Title  Pt will be independent with HEP in order to improve strength and balance in order to decrease fall risk and improve function at home and work    Time  4    Period  Weeks    Status  New        PT Long Term Goals - 03/22/18 2130      PT LONG TERM GOAL #1   Title  Patient will increase FOTO score to 71 to demonstrate predicted increase in functional mobility to complete ADLs    Baseline  03/22/18 56    Time  8    Period  Weeks    Status  New      PT LONG TERM GOAL #2   Title  Patient will demonstrate symmetrical, full gross shoulder AROM in order to return to PLOF and complete ADLs    Baseline  03/22/18: L wnl all motions R: flex 116d ; abd 96d ; IR L4 ;    Time  8    Period  Weeks    Status  New      PT LONG TERM GOAL #3   Title  Patient will demonstrate gross shoulder strength 4+/5 or better to return to PLOF, and to be able to safely complete     Baseline  Deficits in R ER 4/5 ; flex 4/5 ; abd 4+/5    Time  8    Period  Weeks    Status  New      PT LONG TERM GOAL #4   Title  Pt will decrease worst pain as reported on NPRS by at least 3 points in order to demonstrate clinically significant reduction in pain.    Baseline  03/22/18 7/10    Time  8    Period  Weeks    Status  New            Plan - 05/11/18 1733    Clinical Impression Statement  Patient is continuing to respond well to manual + TDN with decreased shoulder height in supine following and noted tension relief. PT progressed therex for posterior shoulder girdle musculature, which patient is able to tolerate well with no increased pain, only muscle fatigue. Patient requires  cuing for proper form with therex but is able to comply with cuing for accuracy following. PT  will re-assess progress toward goals next session.     Rehab Potential  Good    Clinical Impairments Affecting Rehab Potential  (+) fairly yong age, motivation (-) Sedentary lifestyle, chronicity of pain, hypersensitivity, psycho-social factors    PT Frequency  2x / week    PT Duration  8 weeks    PT Treatment/Interventions  Moist Heat;Traction;Ultrasound;Cryotherapy;Fluidtherapy;Electrical Stimulation;Therapeutic exercise;Taping;Patient/family education;Therapeutic activities;Functional mobility training;Neuromuscular re-education;Passive range of motion;Dry needling;Manual techniques    PT Next Visit Plan   manual techniques for soft tissue restriction/mobility, postural re-training, periscapular strengthening    PT Home Exercise Plan  Supine pec stretch, supine IR stretch, pulleys AAROM flex/abd, scapular retractions    Consulted and Agree with Plan of Care  Patient       Patient will benefit from skilled therapeutic intervention in order to improve the following deficits and impairments:  Improper body mechanics, Pain, Postural dysfunction, Impaired tone, Decreased activity tolerance, Decreased endurance, Decreased range of motion, Decreased strength, Impaired UE functional use  Visit Diagnosis: Chronic right shoulder pain     Problem List Patient Active Problem List   Diagnosis Date Noted  . Raynaud phenomenon 05/28/2017  . Generalized anxiety disorder 05/29/2016  . S/P hysterectomy 05/29/2016  . Headache 04/14/2016  . History of mammogram 03/02/2011  . Migraine syndrome   . Hx of colonic polyps   . GERD (gastroesophageal reflux disease) 12/16/2010   Shelton Silvas PT, DPT Shelton Silvas 05/11/2018, 5:51 PM  [facility] [address], [place], [address] Phone: [phone]   Fax:  [phone]  Name: Stephanie Henderson MRN: [id number] Date of Birth: 08-Oct-1965

## 2018-05-22 ENCOUNTER — Ambulatory Visit: Payer: 59 | Attending: Internal Medicine | Admitting: Physical Therapy

## 2018-05-22 ENCOUNTER — Encounter: Payer: Self-pay | Admitting: Physical Therapy

## 2018-05-22 DIAGNOSIS — M25511 Pain in right shoulder: Secondary | ICD-10-CM | POA: Diagnosis not present

## 2018-05-22 DIAGNOSIS — G8929 Other chronic pain: Secondary | ICD-10-CM | POA: Diagnosis not present

## 2018-05-22 NOTE — Therapy (Signed)
[facility] [address] [place], [address] Phone: [phone]   Fax:  [phone]  Physical Therapy Treatment  Patient Details  Name: Stephanie Henderson MRN: [id number] Date of Birth: Mar 03, 1966 Referring Provider (PT): Derrel Nip MD   Encounter Date: 05/22/2018  PT End of Session - 05/22/18 1645    Visit Number  14    Number of Visits  17    Date for PT Re-Evaluation  05/17/18    PT Start Time  0415    PT Stop Time  0500    PT Time Calculation (min)  45 min    Activity Tolerance  Patient tolerated treatment well    Behavior During Therapy  [facility] for tasks assessed/performed       Past Medical History:  Diagnosis Date  . Fibroid   . GERD (gastroesophageal reflux disease) Aug 2012   with esophagitis by EGD  . Hematuria   . Hx of colonic polyps August 2012   repeat due 2016,  5 polyps 2009, clear 2012 Gustavo Lah)  . Menorrhagia    did not tolerate trial of ocps due to migraines  . Migraine headache   . Migraine syndrome    since age 58,  sporadic    Past Surgical History:  Procedure Laterality Date  . BREAST BIOPSY Right 2017   benign  . CYSTOSCOPY    . laparoscopy    . VAGINAL HYSTERECTOMY  6/13   with morcellation    There were no vitals filed for this visit.  Subjective Assessment - 05/22/18 1620    Subjective  Patient reports she was very active on vacation and her shoulder did not "cause her much problem" reporting only 1-2/10 pain since last PT appt. Patient reports most pain is from fast abd motion, and that she has not had a "resting ache" over the past week, which she is very happy about.     Pertinent History  Patient is a 53 year old female with R shoulder pain that began August of this year when she took something out of her pocket and had intense lateral shoulder pain with "tingling". Patient reports pain was intermittent through Sept, but in Oct became pretty constant. Patient reports pain  is sharp at the lateral shoulder, with a dull ache in ant and post shoulder, and that she is unable to sleep on that side. Patient reports most pain with "bringing her arm out to the side, with pain with behind the back motions as well. Patient reports shearing pain at lateral shoulder, with dull ache in post and ant shoulder- occasssional hand "tingling" and forearm ache. Patient reported worst pain over the past week 8/10; best 3/10. Pt denies N/V, B&B, unexplained weight fluctuation, saddle paresthesia, fever, night sweats, or unrelenting night pain at this time.    Limitations  Lifting;House hold activities    How long can you sit comfortably?  unlimited    How long can you stand comfortably?  unlimited    How long can you walk comfortably?  unlimited    Diagnostic tests  none at this time    Pain Onset  More than a month ago         Ther-Ex - Pulleys x20 flexion; x20 abd with min cuing for 3sec hold - Low rows with GTB 3x 10  - OMEGA lat pulldown 20# 3x 10 with cuing initially for proper form with good carry over following - Standing scaption 2# DB each  hand with demo and min cuing for proper form with proper scapular movement with good carry over following  Manual - STM withtrigger point releaseto, R UT,andRmedialdeltoid. (3)66m .30needles placed along theRmiddle deltoid and R UT with pincer grasptechniqueto decrease increased muscular spasms and trigger points with the patient positioned inprone (UT).Good local twitch response noted.Patient was educated on risks and benefits of therapy and verbally consents to PT. - Multiple bouts of PROM into flex/abd/IR/ER (rotation at 80d of abd) with 5sec holds in max range of patient tolerance; severely limited   ESTIM+ heat packHiVolt ESTIM180m at patient tolerated135Vto 145Vatmiddle deltoid and UT. Attemptedto d/c muscle tension at these areas, and to d/c pain, following good success last session. With PT  assessing patient tolerance throughout (increasing intensity as needed), monitoring skin integrity (normal), with decreased pain noted from patient.Utilized time to complete FOTO and discuss continued POC                       PT Education - 05/22/18 1623    Education Details  Exercise form    Person(s) Educated  Patient    Methods  Explanation;Demonstration;Verbal cues    Comprehension  Verbalized understanding;Returned demonstration;Verbal cues required       PT Short Term Goals - 03/22/18 2136      PT SHORT TERM GOAL #1   Title  Pt will be independent with HEP in order to improve strength and balance in order to decrease fall risk and improve function at home and work    Time  4    Period  Weeks    Status  New        PT Long Term Goals - 05/22/18 1658      PT LONG TERM GOAL #1   Title  Patient will increase FOTO score to 71 to demonstrate predicted increase in functional mobility to complete ADLs    Baseline  05/22/18 51    Time  8    Period  Weeks    Status  On-going      PT LONG TERM GOAL #2   Title  Patient will demonstrate symmetrical, full gross shoulder AROM in order to return to PLOF and complete ADLs    Baseline  05/22/18: L wnl all motions R: flex 121d ; abd 75d ; IR L1 ; ER CTJ    Time  8    Period  Weeks    Status  On-going      PT LONG TERM GOAL #3   Title  Patient will demonstrate gross shoulder strength 4+/5 or better to return to PLOF, and to be able to safely complete     Baseline  05/22/18 R ER 5/5 ; IR 4+/5 flex 4+/5 ; abd 4+/5    Time  8    Period  Weeks    Status  Partially Met      PT LONG TERM GOAL #4   Title  Pt will decrease worst pain as reported on NPRS by at least 3 points in order to demonstrate clinically significant reduction in pain.    Baseline  05/22/18 2/10    Time  8    Period  Weeks    Status  Achieved            Plan - 05/22/18 1710    Clinical Impression Statement  Patient is continuing to have decreased  pain, but deficits in motion ER>abd>IR. Patient is progressing as predicted for adhesive capsulitis pattern into "frozen  stage" with less pain, increased upper trap activation, and stiffness in shoulder ROM. Patient is continuing to benefit from PT services for pain management to be able to complete her work duties and ADLs. Patient's decreased pain has also allowed for increased postural muscle strengthening to decrease poor shoulder positioning/impingement and to allow for compensation during this time. Patient will continue to benefit from skilled PT for pain management and continued efforts to restore ROM and strength.     Rehab Potential  Good    Clinical Impairments Affecting Rehab Potential  (+) fairly yong age, motivation (-) Sedentary lifestyle, chronicity of pain, hypersensitivity, psycho-social factors    PT Frequency  2x / week    PT Duration  8 weeks    PT Treatment/Interventions  Moist Heat;Traction;Ultrasound;Cryotherapy;Fluidtherapy;Electrical Stimulation;Therapeutic exercise;Taping;Patient/family education;Therapeutic activities;Functional mobility training;Neuromuscular re-education;Passive range of motion;Dry needling;Manual techniques    PT Next Visit Plan   manual techniques for soft tissue restriction/mobility, postural re-training, periscapular strengthening    PT Home Exercise Plan  Supine pec stretch, supine IR stretch, pulleys AAROM flex/abd, scapular retractions    Consulted and Agree with Plan of Care  Patient       Patient will benefit from skilled therapeutic intervention in order to improve the following deficits and impairments:  Improper body mechanics, Pain, Postural dysfunction, Impaired tone, Decreased activity tolerance, Decreased endurance, Decreased range of motion, Decreased strength, Impaired UE functional use  Visit Diagnosis: Chronic right shoulder pain     Problem List Patient Active Problem List   Diagnosis Date Noted  . Raynaud phenomenon  05/28/2017  . Generalized anxiety disorder 05/29/2016  . S/P hysterectomy 05/29/2016  . Headache 04/14/2016  . History of mammogram 03/02/2011  . Migraine syndrome   . Hx of colonic polyps   . GERD (gastroesophageal reflux disease) 12/16/2010   Shelton Silvas PT, DPT Shelton Silvas 05/22/2018, 5:15 PM  [facility] [address], [place], [address] Phone: [phone]   Fax:  [phone]  Name: Stephanie Henderson MRN: [id number] Date of Birth: 11-Sep-1965

## 2018-05-25 ENCOUNTER — Encounter: Payer: Self-pay | Admitting: Internal Medicine

## 2018-05-25 ENCOUNTER — Ambulatory Visit (INDEPENDENT_AMBULATORY_CARE_PROVIDER_SITE_OTHER): Payer: 59 | Admitting: Internal Medicine

## 2018-05-25 VITALS — BP 104/68 | HR 99 | Temp 98.1°F | Resp 15 | Ht 62.0 in | Wt 111.0 lb

## 2018-05-25 DIAGNOSIS — N6459 Other signs and symptoms in breast: Secondary | ICD-10-CM

## 2018-05-25 DIAGNOSIS — Z86010 Personal history of colonic polyps: Secondary | ICD-10-CM

## 2018-05-25 DIAGNOSIS — Z0001 Encounter for general adult medical examination with abnormal findings: Secondary | ICD-10-CM

## 2018-05-25 DIAGNOSIS — Z89231 Acquired absence of right shoulder: Secondary | ICD-10-CM

## 2018-05-25 DIAGNOSIS — Z800 Family history of malignant neoplasm of digestive organs: Secondary | ICD-10-CM | POA: Diagnosis not present

## 2018-05-25 DIAGNOSIS — Z0000 Encounter for general adult medical examination without abnormal findings: Secondary | ICD-10-CM | POA: Diagnosis not present

## 2018-05-25 DIAGNOSIS — F411 Generalized anxiety disorder: Secondary | ICD-10-CM | POA: Diagnosis not present

## 2018-05-25 DIAGNOSIS — R5383 Other fatigue: Secondary | ICD-10-CM

## 2018-05-25 LAB — CBC WITH DIFFERENTIAL/PLATELET
Basophils Absolute: 0 10*3/uL (ref 0.0–0.1)
Basophils Relative: 0.5 % (ref 0.0–3.0)
Eosinophils Absolute: 1.2 10*3/uL — ABNORMAL HIGH (ref 0.0–0.7)
Eosinophils Relative: 14.7 % — ABNORMAL HIGH (ref 0.0–5.0)
HEMATOCRIT: 38.5 % (ref 36.0–46.0)
Hemoglobin: 12.7 g/dL (ref 12.0–15.0)
Lymphocytes Relative: 23.1 % (ref 12.0–46.0)
Lymphs Abs: 1.9 10*3/uL (ref 0.7–4.0)
MCHC: 32.8 g/dL (ref 30.0–36.0)
MCV: 99.7 fl (ref 78.0–100.0)
MONO ABS: 0.4 10*3/uL (ref 0.1–1.0)
Monocytes Relative: 5.3 % (ref 3.0–12.0)
Neutro Abs: 4.7 10*3/uL (ref 1.4–7.7)
Neutrophils Relative %: 56.4 % (ref 43.0–77.0)
Platelets: 263 10*3/uL (ref 150.0–400.0)
RBC: 3.87 Mil/uL (ref 3.87–5.11)
RDW: 14.4 % (ref 11.5–15.5)
WBC: 8.3 10*3/uL (ref 4.0–10.5)

## 2018-05-25 LAB — LIPID PANEL
CHOL/HDL RATIO: 3
Cholesterol: 194 mg/dL (ref 0–200)
HDL: 60.5 mg/dL (ref >39.00)
LDL Cholesterol: 125 mg/dL — ABNORMAL HIGH (ref 0–99)
NonHDL: 133.72
Triglycerides: 45 mg/dL (ref 0.0–149.0)
VLDL: 9 mg/dL (ref 0.0–40.0)

## 2018-05-25 LAB — COMPREHENSIVE METABOLIC PANEL
ALT: 43 U/L — ABNORMAL HIGH (ref 0–35)
AST: 32 U/L (ref 0–37)
Albumin: 4.4 g/dL (ref 3.5–5.2)
Alkaline Phosphatase: 33 U/L — ABNORMAL LOW (ref 39–117)
BUN: 10 mg/dL (ref 6–23)
CO2: 29 meq/L (ref 19–32)
Calcium: 9.4 mg/dL (ref 8.4–10.5)
Chloride: 102 mEq/L (ref 96–112)
Creatinine, Ser: 0.55 mg/dL (ref 0.40–1.20)
GFR: 123.91 mL/min (ref >60.00)
Glucose, Bld: 83 mg/dL (ref 70–99)
Potassium: 4.1 mEq/L (ref 3.5–5.1)
Sodium: 137 mEq/L (ref 135–145)
Total Bilirubin: 0.4 mg/dL (ref 0.2–1.2)
Total Protein: 6.5 g/dL (ref 6.0–8.3)

## 2018-05-25 LAB — HEMOGLOBIN A1C: Hgb A1c MFr Bld: 5.8 % (ref 4.6–6.5)

## 2018-05-25 MED ORDER — FLUTICASONE PROPIONATE 50 MCG/ACT NA SUSP: 1.0000 | Freq: Two times a day (BID) | NASAL | 0 refills | Status: DC

## 2018-05-25 MED ORDER — CYCLOBENZAPRINE HCL 10 MG PO TABS: 10.0000 mg | ORAL_TABLET | Freq: Every day | ORAL | 11 refills | Status: DC

## 2018-05-25 MED ORDER — PANTOPRAZOLE SODIUM 40 MG PO TBEC: 40.0000 mg | DELAYED_RELEASE_TABLET | Freq: Every day | ORAL | 2 refills | Status: DC

## 2018-05-25 MED ORDER — FAMOTIDINE 40 MG PO TABS: 40.0000 mg | ORAL_TABLET | Freq: Every day | ORAL | 1 refills | Status: DC

## 2018-05-25 MED ORDER — ALPRAZOLAM 0.5 MG PO TABS: 0.5000 mg | ORAL_TABLET | Freq: Every evening | ORAL | 1 refills | Status: DC | PRN

## 2018-05-25 MED ORDER — SUMATRIPTAN SUCCINATE 100 MG PO TABS: 100.0000 mg | ORAL_TABLET | Freq: Once | ORAL | 5 refills | Status: DC

## 2018-05-25 NOTE — Progress Notes (Signed)
Patient ID: Stephanie Henderson, female    DOB: [birth year]  Age: 53 y.o. MRN: [id number]  The patient is here for annual preventive examienation and management of other chronic and acute problems.  S/p total hysterectomy,    The risk factors are reflected in the social history.  The roster of all physicians providing medical care to patient - is listed in the Snapshot section of the chart.  Activities of daily living:  The patient is 100% independent in all ADLs: dressing, toileting, feeding as well as independent mobility  Home safety : The patient has smoke detectors in the home. They wear seatbelts.  There are no firearms at home. There is no violence in the home.   There is no risks for hepatitis, STDs or HIV. There is no   history of blood transfusion. They have no travel history to infectious disease endemic areas of the world.  The patient has seen their dentist in the last six month. They have seen their eye doctor in the last year. They admit to slight hearing difficulty with regard to whispered voices and some television programs.  They have deferred audiologic testing in the last year.  They do not  have excessive sun exposure. Discussed the need for sun protection: hats, long sleeves and use of sunscreen if there is significant sun exposure.   Diet: the importance of a healthy diet is discussed. They do have a healthy diet.  The benefits of regular aerobic exercise were discussed. She walks 4 times per week ,  20 minutes.   Depression screen: there are no signs or vegative symptoms of depression- irritability, change in appetite, anhedonia, sadness/tearfullness.  Cognitive assessment: the patient manages all their financial and personal affairs and is actively engaged. They could relate day,date,year and events; recalled 2/3 objects at 3 minutes; performed clock-face test normally.  The following portions of the patient's history were reviewed and updated as appropriate: allergies,  current medications, past family history, past medical history,  past surgical history, past social history  and problem list.  Visual acuity was not assessed per patient preference since she has regular follow up with her ophthalmologist. Hearing and body mass index were assessed and reviewed.   During the course of the visit the patient was educated and counseled about appropriate screening and preventive services including : fall prevention , diabetes screening, nutrition counseling, colorectal cancer screening, and recommended immunizations.    CC: The primary encounter diagnosis was Right shoulder amputee. Diagnoses of Abnormal breast exam, Family history of colon cancer requiring screening colonoscopy, Encounter for preventive health examination, Fatigue, unspecified type, Encounter for preventative adult health care exam with abnormal findings, Hx of colonic polyps, and Generalized anxiety disorder were also pertinent to this visit.   1) right shoulder improving  with PT . No history of trauma but lots of physical stress during 3 moves and packing.  2) tender under arm,  found knot under left axilla   2 weeks ago,  3) anxiety : uses xanax several times per month  Only for sleep  History Jewelene has a past medical history of Fibroid, GERD (gastroesophageal reflux disease) (Aug 2012), Hematuria, colonic polyps (August 2012), Menorrhagia, Migraine headache, and Migraine syndrome.   She has a past surgical history that includes laparoscopy; Cystoscopy; Vaginal hysterectomy (6/13); and Breast biopsy (Right, 2017).   Her family history includes Cancer (age of onset: 42) in her father; Colon cancer in her father; Depression in her father; Hyperlipidemia in her mother; Kidney  disease in her daughter; Mental illness in her father and mother.She reports that she has never smoked. She has never used smokeless tobacco. She reports current alcohol use of about 4.0 - 5.0 standard drinks of alcohol per week.  She reports that she does not use drugs.  Outpatient Medications Prior to Visit  Medication Sig Dispense Refill  . sucralfate (CARAFATE) 1 G tablet Take 1 g by mouth 3 (three) times daily as needed.      . ALPRAZolam (XANAX) 0.5 MG tablet Take 1 tablet (0.5 mg total) by mouth 2 (two) times daily as needed for anxiety. 30 tablet 1  . cyclobenzaprine (FLEXERIL) 10 MG tablet Take 1 tablet (10 mg total) by mouth at bedtime. 30 tablet 11  . famotidine (PEPCID) 40 MG tablet Take 1 tablet (40 mg total) by mouth daily. 90 tablet 0  . pantoprazole (PROTONIX) 40 MG tablet Take 1 tablet (40 mg total) by mouth daily. 90 tablet 2  . amoxicillin-clavulanate (AUGMENTIN) 875-125 MG tablet Take 1 tablet by mouth 2 (two) times daily. (Patient not taking: Reported on 05/25/2018) 20 tablet 0  . fluticasone (FLONASE) 50 MCG/ACT nasal spray Place 1 spray into both nostrils 2 (two) times daily for 10 days. 1 g 0  . SUMAtriptan (IMITREX) 100 MG tablet Take 1 tablet (100 mg total) by mouth once. May repeat in 2 hours if needed 10 tablet 5   No facility-administered medications prior to visit.     Review of Systems   Patient denies headache, fevers, malaise, unintentional weight loss, skin rash, eye pain, sinus congestion and sinus pain, sore throat, dysphagia,  hemoptysis , cough, dyspnea, wheezing, chest pain, palpitations, orthopnea, edema, abdominal pain, nausea, melena, diarrhea, constipation, flank pain, dysuria, hematuria, urinary  Frequency, nocturia, numbness, tingling, seizures,  Focal weakness, Loss of consciousness,  Tremor, insomnia, depression, anxiety, and suicidal ideation.      Objective:  BP 104/68 (BP Location: Left Arm, Patient Position: Sitting, Cuff Size: Normal)   Pulse 99   Temp 98.1 F (36.7 C) (Oral)   Resp 15   Ht 5\' 2"  (1.575 m)   Wt 111 lb (50.3 kg)   LMP 02/20/2011   SpO2 99%   BMI 20.30 kg/m   Physical Exam   General appearance: alert, cooperative and appears stated age Head:  Normocephalic, without obvious abnormality, atraumatic Eyes: conjunctivae/corneas clear. PERRL, EOM's intact. Fundi benign. Ears: normal TM's and external ear canals both ears Nose: Nares normal. Septum midline. Mucosa normal. No drainage or sinus tenderness. Throat: lips, mucosa, and tongue normal; teeth and gums normal Neck: no adenopathy, no carotid bruit, no JVD, supple, symmetrical, trachea midline and thyroid not enlarged, symmetric, no tenderness/mass/nodules Lungs: clear to auscultation bilaterally Breasts: normal appearance, no masses in left breast. Sub cm mass at 10:00 position adjacent to nipple , right breast  Heart: regular rate and rhythm, S1, S2 normal, no murmur, click, rub or gallop Abdomen: soft, non-tender; bowel sounds normal; no masses,  no organomegaly Extremities: extremities normal, atraumatic, no cyanosis or edema Pulses: 2+ and symmetric Skin: Skin color, texture, turgor normal. No rashes or lesions Neurologic: Alert and oriented X 3, normal strength and tone. Normal symmetric reflexes. Normal coordination and gait.     Assessment & Plan:   Problem List Items Addressed This Visit    Encounter for preventative adult health care exam with abnormal findings    age appropriate education and counseling updated, referrals for preventative services and immunizations addressed, dietary and smoking counseling addressed, most  recent labs reviewed.  I have personally reviewed and have noted:  1) the patient's medical and social history 2) The pt's use of alcohol, tobacco, and illicit drugs 3) The patient's current medications and supplements 4) Functional ability including ADL's, fall risk, home safety risk, hearing and visual impairment 5) Diet and physical activities 6) Evidence for depression or mood disorder 7) The patient's height, weight, and BMI have been recorded in the chart  I have made referrals, and provided counseling and education based on review of the  above.  Diagnostic mammogram ordered.      Generalized anxiety disorder    Did not tolerate zoloft.   Reviewed principles of good sleep hygiene. The risks and benefits of benzodiazepine use were discussed with patient today including excessive sedation leading to respiratory depression,  impaired thinking/driving, and addiction.  Patient was advised to avoid concurrent use with alcohol, to use medication only as needed and not to share with others  .       Relevant Medications   ALPRAZolam (XANAX) 0.5 MG tablet   Hx of colonic polyps    Referral for colonoscopy made.        Other Visit Diagnoses    Right shoulder amputee    -  Primary   Relevant Orders   Ambulatory referral to Sports Medicine   Abnormal breast exam       Relevant Orders   MM Digital Diagnostic Bilat   US BREAST COMPLETE UNI RIGHT INC AXILLA   Family history of colon cancer requiring screening colonoscopy       Relevant Orders   Ambulatory referral to Gastroenterology   Encounter for preventive health examination       Relevant Orders   Hemoglobin A1c (Completed)   Lipid panel (Completed)   Comprehensive metabolic panel (Completed)   Fatigue, unspecified type       Relevant Orders   CBC with Differential/Platelet (Completed)      I have discontinued Lattie Haw T. Arbuthnot's amoxicillin-clavulanate. I have also changed her SUMAtriptan and ALPRAZolam. Additionally, I am having her maintain her sucralfate, cyclobenzaprine, pantoprazole, famotidine, and fluticasone.  Meds ordered this encounter  Medications  . SUMAtriptan (IMITREX) 100 MG tablet    Sig: Take 1 tablet (100 mg total) by mouth once for 1 dose. May repeat in 2 hours if needed    Dispense:  10 tablet    Refill:  5  . ALPRAZolam (XANAX) 0.5 MG tablet    Sig: Take 1 tablet (0.5 mg total) by mouth at bedtime as needed for anxiety.    Dispense:  30 tablet    Refill:  1  . cyclobenzaprine (FLEXERIL) 10 MG tablet    Sig: Take 1 tablet (10 mg total) by mouth  at bedtime.    Dispense:  30 tablet    Refill:  11  . pantoprazole (PROTONIX) 40 MG tablet    Sig: Take 1 tablet (40 mg total) by mouth daily.    Dispense:  90 tablet    Refill:  2  . famotidine (PEPCID) 40 MG tablet    Sig: Take 1 tablet (40 mg total) by mouth daily.    Dispense:  90 tablet    Refill:  1  . fluticasone (FLONASE) 50 MCG/ACT nasal spray    Sig: Place 1 spray into both nostrils 2 (two) times daily for 10 days.    Dispense:  1 g    Refill:  0    Medications Discontinued During This Encounter  Medication  Reason  . amoxicillin-clavulanate (AUGMENTIN) 875-125 MG tablet   . SUMAtriptan (IMITREX) 100 MG tablet Reorder  . fluticasone (FLONASE) 50 MCG/ACT nasal spray   . ALPRAZolam (XANAX) 0.5 MG tablet Reorder  . cyclobenzaprine (FLEXERIL) 10 MG tablet Reorder  . pantoprazole (PROTONIX) 40 MG tablet Reorder  . famotidine (PEPCID) 40 MG tablet Reorder    Follow-up: No follow-ups on file.   Crecencio Mc, MD

## 2018-05-25 NOTE — Patient Instructions (Addendum)
We are no longer allowed to check Vitamin D's!  However,  Your vitamin D WAS borderline low last year. .  If you are not taking a D3 supplement,  please start taking 1000 [place] daily.  If you are ,  Please increase to 2000 IUs daily for the winter months. . Low Vitamin D can increase your risk of weak bones and fractures and interfere with your body's ability to absorb the calcium in your diet  All meds have been refilled   Mammogram and GI referral made  Health Maintenance, Female Adopting a healthy lifestyle and getting preventive care can go a long way to promote health and wellness. Talk with your health care provider about what schedule of regular examinations is right for you. This is a good chance for you to check in with your provider about disease prevention and staying healthy. In between checkups, there are plenty of things you can do on your own. Experts have done a lot of research about which lifestyle changes and preventive measures are most likely to keep you healthy. Ask your health care provider for more information. Weight and diet Eat a healthy diet  Be sure to include plenty of vegetables, fruits, low-fat dairy products, and lean protein.  Do not eat a lot of foods high in solid fats, added sugars, or salt.  Get regular exercise. This is one of the most important things you can do for your health. ? Most adults should exercise for at least 150 minutes each week. The exercise should increase your heart rate and make you sweat (moderate-intensity exercise). ? Most adults should also do strengthening exercises at least twice a week. This is in addition to the moderate-intensity exercise. Maintain a healthy weight  Body mass index (BMI) is a measurement that can be used to identify possible weight problems. It estimates body fat based on height and weight. Your health care provider can help determine your BMI and help you achieve or maintain a healthy weight.  For females 82 years  of age and older: ? A BMI below 18.5 is considered underweight. ? A BMI of 18.5 to 24.9 is normal. ? A BMI of 25 to 29.9 is considered overweight. ? A BMI of 30 and above is considered obese. Watch levels of cholesterol and blood lipids  You should start having your blood tested for lipids and cholesterol at 51 years of age, then have this test every 5 years.  You may need to have your cholesterol levels checked more often if: ? Your lipid or cholesterol levels are high. ? You are older than 51 years of age. ? You are at high risk for heart disease. Cancer screening Lung Cancer  Lung cancer screening is recommended for adults 70-85 years old who are at high risk for lung cancer because of a history of smoking.  A yearly low-dose CT scan of the lungs is recommended for people who: ? Currently smoke. ? Have quit within the past 15 years. ? Have at least a 30-pack-year history of smoking. A pack year is smoking an average of one pack of cigarettes a day for 1 year.  Yearly screening should continue until it has been 15 years since you quit.  Yearly screening should stop if you develop a health problem that would prevent you from having lung cancer treatment. Breast Cancer  Practice breast self-awareness. This means understanding how your breasts normally appear and feel.  It also means doing regular breast self-exams. Let your health  care provider know about any changes, no matter how small.  If you are in your 20s or 30s, you should have a clinical breast exam (CBE) by a health care provider every 1-3 years as part of a regular health exam.  If you are 53 or older, have a CBE every year. Also consider having a breast X-ray (mammogram) every year.  If you have a family history of breast cancer, talk to your health care provider about genetic screening.  If you are at high risk for breast cancer, talk to your health care provider about having an MRI and a mammogram every  year.  Breast cancer gene (BRCA) assessment is recommended for women who have family members with BRCA-related cancers. BRCA-related cancers include: ? Breast. ? Ovarian. ? Tubal. ? Peritoneal cancers.  Results of the assessment will determine the need for genetic counseling and BRCA1 and BRCA2 testing. Cervical Cancer Your health care provider may recommend that you be screened regularly for cancer of the pelvic organs (ovaries, uterus, and vagina). This screening involves a pelvic examination, including checking for microscopic changes to the surface of your cervix (Pap test). You may be encouraged to have this screening done every 3 years, beginning at age 54.  For women ages 37-65, health care providers may recommend pelvic exams and Pap testing every 3 years, or they may recommend the Pap and pelvic exam, combined with testing for human papilloma virus (HPV), every 5 years. Some types of HPV increase your risk of cervical cancer. Testing for HPV may also be done on women of any age with unclear Pap test results.  Other health care providers may not recommend any screening for nonpregnant women who are considered low risk for pelvic cancer and who do not have symptoms. Ask your health care provider if a screening pelvic exam is right for you.  If you have had past treatment for cervical cancer or a condition that could lead to cancer, you need Pap tests and screening for cancer for at least 20 years after your treatment. If Pap tests have been discontinued, your risk factors (such as having a new sexual partner) need to be reassessed to determine if screening should resume. Some women have medical problems that increase the chance of getting cervical cancer. In these cases, your health care provider may recommend more frequent screening and Pap tests. Colorectal Cancer  This type of cancer can be detected and often prevented.  Routine colorectal cancer screening usually begins at 51 years of  age and continues through 51 years of age.  Your health care provider may recommend screening at an earlier age if you have risk factors for colon cancer.  Your health care provider may also recommend using home test kits to check for hidden blood in the stool.  A small camera at the end of a tube can be used to examine your colon directly (sigmoidoscopy or colonoscopy). This is done to check for the earliest forms of colorectal cancer.  Routine screening usually begins at age 2.  Direct examination of the colon should be repeated every 5-10 years through 51 years of age. However, you may need to be screened more often if early forms of precancerous polyps or small growths are found. Skin Cancer  Check your skin from head to toe regularly.  Tell your health care provider about any new moles or changes in moles, especially if there is a change in a mole's shape or color.  Also tell your health care  provider if you have a mole that is larger than the size of a pencil eraser.  Always use sunscreen. Apply sunscreen liberally and repeatedly throughout the day.  Protect yourself by wearing long sleeves, pants, a wide-brimmed hat, and sunglasses whenever you are outside. Heart disease, diabetes, and high blood pressure  High blood pressure causes heart disease and increases the risk of stroke. High blood pressure is more likely to develop in: ? People who have blood pressure in the high end of the normal range (130-139/85-89 mm Hg). ? People who are overweight or obese. ? People who are African American.  If you are 59-38 years of age, have your blood pressure checked every 3-5 years. If you are 35 years of age or older, have your blood pressure checked every year. You should have your blood pressure measured twice-once when you are at a hospital or clinic, and once when you are not at a hospital or clinic. Record the average of the two measurements. To check your blood pressure when you are  not at a hospital or clinic, you can use: ? An automated blood pressure machine at a pharmacy. ? A home blood pressure monitor.  If you are between 30 years and 57 years old, ask your health care provider if you should take aspirin to prevent strokes.  Have regular diabetes screenings. This involves taking a blood sample to check your fasting blood sugar level. ? If you are at a normal weight and have a low risk for diabetes, have this test once every three years after 51 years of age. ? If you are overweight and have a high risk for diabetes, consider being tested at a younger age or more often. Preventing infection Hepatitis B  If you have a higher risk for hepatitis B, you should be screened for this virus. You are considered at high risk for hepatitis B if: ? You were born in a country where hepatitis B is common. Ask your health care provider which countries are considered high risk. ? Your parents were born in a high-risk country, and you have not been immunized against hepatitis B (hepatitis B vaccine). ? You have HIV or AIDS. ? You use needles to inject street drugs. ? You live with someone who has hepatitis B. ? You have had sex with someone who has hepatitis B. ? You get hemodialysis treatment. ? You take certain medicines for conditions, including cancer, organ transplantation, and autoimmune conditions. Hepatitis C  Blood testing is recommended for: ? Everyone born from 50 through 1965. ? Anyone with known risk factors for hepatitis C. Sexually transmitted infections (STIs)  You should be screened for sexually transmitted infections (STIs) including gonorrhea and chlamydia if: ? You are sexually active and are younger than 51 years of age. ? You are older than 51 years of age and your health care provider tells you that you are at risk for this type of infection. ? Your sexual activity has changed since you were last screened and you are at an increased risk for chlamydia  or gonorrhea. Ask your health care provider if you are at risk.  If you do not have HIV, but are at risk, it may be recommended that you take a prescription medicine daily to prevent HIV infection. This is called pre-exposure prophylaxis (PrEP). You are considered at risk if: ? You are sexually active and do not regularly use condoms or know the HIV status of your partner(s). ? You take drugs by injection. ?  You are sexually active with a partner who has HIV. Talk with your health care provider about whether you are at high risk of being infected with HIV. If you choose to begin PrEP, you should first be tested for HIV. You should then be tested every 3 months for as long as you are taking PrEP. Pregnancy  If you are premenopausal and you may become pregnant, ask your health care provider about preconception counseling.  If you may become pregnant, take 400 to 800 micrograms (mcg) of folic acid every day.  If you want to prevent pregnancy, talk to your health care provider about birth control (contraception). Osteoporosis and menopause  Osteoporosis is a disease in which the bones lose minerals and strength with aging. This can result in serious bone fractures. Your risk for osteoporosis can be identified using a bone density scan.  If you are 87 years of age or older, or if you are at risk for osteoporosis and fractures, ask your health care provider if you should be screened.  Ask your health care provider whether you should take a calcium or vitamin D supplement to lower your risk for osteoporosis.  Menopause may have certain physical symptoms and risks.  Hormone replacement therapy may reduce some of these symptoms and risks. Talk to your health care provider about whether hormone replacement therapy is right for you. Follow these instructions at home:  Schedule regular health, dental, and eye exams.  Stay current with your immunizations.  Do not use any tobacco products including  cigarettes, chewing tobacco, or electronic cigarettes.  If you are pregnant, do not drink alcohol.  If you are breastfeeding, limit how much and how often you drink alcohol.  Limit alcohol intake to no more than 1 drink per day for nonpregnant women. One drink equals 12 ounces of beer, 5 ounces of wine, or 1 ounces of hard liquor.  Do not use street drugs.  Do not share needles.  Ask your health care provider for help if you need support or information about quitting drugs.  Tell your health care provider if you often feel depressed.  Tell your health care provider if you have ever been abused or do not feel safe at home. This information is not intended to replace advice given to you by your health care provider. Make sure you discuss any questions you have with your health care provider. Document Released: 11/16/2010 Document Revised: 10/09/2015 Document Reviewed: 02/04/2015 Elsevier Interactive Patient Education  2019 [employer].

## 2018-05-27 DIAGNOSIS — Z0000 Encounter for general adult medical examination without abnormal findings: Secondary | ICD-10-CM | POA: Insufficient documentation

## 2018-05-27 DIAGNOSIS — Z0001 Encounter for general adult medical examination with abnormal findings: Secondary | ICD-10-CM | POA: Insufficient documentation

## 2018-05-27 NOTE — Assessment & Plan Note (Signed)
Did not tolerate zoloft.   Reviewed principles of good sleep hygiene. The risks and benefits of benzodiazepine use were discussed with patient today including excessive sedation leading to respiratory depression,  impaired thinking/driving, and addiction.  Patient was advised to avoid concurrent use with alcohol, to use medication only as needed and not to share with others  .

## 2018-05-27 NOTE — Assessment & Plan Note (Signed)
Referral for colonoscopy made.

## 2018-05-27 NOTE — Assessment & Plan Note (Signed)

## 2018-05-31 ENCOUNTER — Ambulatory Visit: Payer: 59 | Admitting: Physical Therapy

## 2018-05-31 ENCOUNTER — Encounter: Payer: Self-pay | Admitting: Physical Therapy

## 2018-05-31 DIAGNOSIS — M25511 Pain in right shoulder: Secondary | ICD-10-CM | POA: Diagnosis not present

## 2018-05-31 DIAGNOSIS — G8929 Other chronic pain: Secondary | ICD-10-CM

## 2018-05-31 NOTE — Therapy (Signed)
[facility] [address], [place], [address] Phone: [phone]   Fax:  [phone]  Physical Therapy Treatment  Patient Details  Name: Stephanie Henderson MRN: [id number] Date of Birth: Feb 27, 1966 Referring Provider (PT): Derrel Nip MD   Encounter Date: 05/31/2018  PT End of Session - 05/31/18 1558    Visit Number  15    Number of Visits  33    Date for PT Re-Evaluation  07/12/18    PT Start Time  0315    PT Stop Time  0400    PT Time Calculation (min)  45 min    Activity Tolerance  Patient tolerated treatment well    Behavior During Therapy  [facility] for tasks assessed/performed       Past Medical History:  Diagnosis Date  . Fibroid   . GERD (gastroesophageal reflux disease) Aug 2012   with esophagitis by EGD  . Hematuria   . Hx of colonic polyps August 2012   repeat due 2016,  5 polyps 2009, clear 2012 Gustavo Lah)  . Menorrhagia    did not tolerate trial of ocps due to migraines  . Migraine headache   . Migraine syndrome    since age 48,  sporadic    Past Surgical History:  Procedure Laterality Date  . BREAST BIOPSY Right 2017   benign  . CYSTOSCOPY    . laparoscopy    . VAGINAL HYSTERECTOMY  6/13   with morcellation    There were no vitals filed for this visit.  Subjective Assessment - 05/31/18 1519    Subjective  Patient reports minimal pain over the past week, with flare up yesterday that she cannot attribute to anything different that she did different. Patient reports 1/10 pain with tension along R UT and superior scapular border.     Pertinent History  Patient is a 53 year old female with R shoulder pain that began August of this year when she took something out of her pocket and had intense lateral shoulder pain with "tingling". Patient reports pain was intermittent through Sept, but in Oct became pretty constant. Patient reports pain is sharp at the lateral shoulder, with a dull ache in ant and  post shoulder, and that she is unable to sleep on that side. Patient reports most pain with "bringing her arm out to the side, with pain with behind the back motions as well. Patient reports shearing pain at lateral shoulder, with dull ache in post and ant shoulder- occasssional hand "tingling" and forearm ache. Patient reported worst pain over the past week 8/10; best 3/10. Pt denies N/V, B&B, unexplained weight fluctuation, saddle paresthesia, fever, night sweats, or unrelenting night pain at this time.    Limitations  Lifting;House hold activities    How long can you sit comfortably?  unlimited    How long can you stand comfortably?  unlimited    How long can you walk comfortably?  unlimited    Pain Onset  More than a month ago       Ther-Ex - Pulleys x20 flexion; x20 abd with min cuing for 3sec hold - OMEGA lat pulldown 25# 3x 10 with cuing initially for proper form with good carry over following - Push Up plus from knees 3x10 with TC and VC initially for proper muscle activation - Supine shoulder protraction/retraction 4# DB 3x 10 with demo and cuinginitally with good carry over following  - High rows green TB 3x 10 with TC  and visual demo for proper set up and scapular contraction with good carry over following  Manual - STM withtrigger point releaseto, R UT,andRmedialdeltoid. (3)69m .30needles placed along theRlevatorand R UT with pincer grasptechniqueto decrease increased muscular spasms and trigger points with the patient positioned inprone (UT).Good local twitch response noted.Patient was educated on risks and benefits of therapy and verbally consents to PT. - Multiple bouts of PROM into flex/abd/IR/ER (rotation at 80d of abd) with 5sec holds in max range of patient tolerance; severely limited                       PT Education - 05/31/18 1557    Education Details  Exercise form; HEP update    Person(s) Educated  Patient    Methods   Explanation;Demonstration;Verbal cues    Comprehension  Verbalized understanding;Returned demonstration;Verbal cues required       PT Short Term Goals - 03/22/18 2136      PT SHORT TERM GOAL #1   Title  Pt will be independent with HEP in order to improve strength and balance in order to decrease fall risk and improve function at home and work    Time  4    Period  Weeks    Status  New        PT Long Term Goals - 05/22/18 1658      PT LONG TERM GOAL #1   Title  Patient will increase FOTO score to 71 to demonstrate predicted increase in functional mobility to complete ADLs    Baseline  05/22/18 51    Time  8    Period  Weeks    Status  On-going      PT LONG TERM GOAL #2   Title  Patient will demonstrate symmetrical, full gross shoulder AROM in order to return to PLOF and complete ADLs    Baseline  05/22/18: L wnl all motions R: flex 121d ; abd 75d ; IR L1 ; ER CTJ    Time  8    Period  Weeks    Status  On-going      PT LONG TERM GOAL #3   Title  Patient will demonstrate gross shoulder strength 4+/5 or better to return to PLOF, and to be able to safely complete     Baseline  05/22/18 R ER 5/5 ; IR 4+/5 flex 4+/5 ; abd 4+/5    Time  8    Period  Weeks    Status  Partially Met      PT LONG TERM GOAL #4   Title  Pt will decrease worst pain as reported on NPRS by at least 3 points in order to demonstrate clinically significant reduction in pain.    Baseline  05/22/18 2/10    Time  8    Period  Weeks    Status  Achieved            Plan - 05/31/18 1637    Clinical Impression Statement  PT continued muscle tension relief with manual + TDN techqniues, which reduces muscle tension, with noted decreased shoulder height following. PT led patient through therex for periscapular musculature, specifically to reduce scapular winging. PT updated HEP to reflect continued maintainance of strengthening to prevent further injury or compoensation. Patient will continue to benefit from skilled  PT services to continue to address strength and ROM impairments.     Rehab Potential  Good    Clinical Impairments Affecting Rehab Potential  (+)  fairly yong age, motivation (-) Sedentary lifestyle, chronicity of pain, hypersensitivity, psycho-social factors    PT Frequency  2x / week    PT Treatment/Interventions  Moist Heat;Traction;Ultrasound;Cryotherapy;Fluidtherapy;Electrical Stimulation;Therapeutic exercise;Taping;Patient/family education;Therapeutic activities;Functional mobility training;Neuromuscular re-education;Passive range of motion;Dry needling;Manual techniques    PT Next Visit Plan   manual techniques for soft tissue restriction/mobility, postural re-training, periscapular strengthening    PT Home Exercise Plan  Supine pec stretch, supine IR stretch, pulleys AAROM flex/abd, scapular retractions    Consulted and Agree with Plan of Care  Patient       Patient will benefit from skilled therapeutic intervention in order to improve the following deficits and impairments:  Improper body mechanics, Pain, Postural dysfunction, Impaired tone, Decreased activity tolerance, Decreased endurance, Decreased range of motion, Decreased strength, Impaired UE functional use  Visit Diagnosis: Chronic right shoulder pain     Problem List Patient Active Problem List   Diagnosis Date Noted  . Encounter for preventative adult health care exam with abnormal findings 05/27/2018  . Raynaud phenomenon 05/28/2017  . Generalized anxiety disorder 05/29/2016  . S/P hysterectomy 05/29/2016  . Headache 04/14/2016  . History of mammogram 03/02/2011  . Migraine syndrome   . Hx of colonic polyps   . GERD (gastroesophageal reflux disease) 12/16/2010   Shelton Silvas PT, DPT Shelton Silvas 05/31/2018, 4:50 PM  [facility] [address], [place], [address] Phone: [phone]   Fax:  [phone]  Name: DHARA SCHEPP MRN:  [id number] Date of Birth: 13-Jul-1965

## 2018-06-06 ENCOUNTER — Ambulatory Visit: Payer: 59 | Admitting: Physical Therapy

## 2018-06-06 ENCOUNTER — Encounter: Payer: Self-pay | Admitting: Physical Therapy

## 2018-06-06 DIAGNOSIS — G8929 Other chronic pain: Secondary | ICD-10-CM | POA: Diagnosis not present

## 2018-06-06 DIAGNOSIS — M25511 Pain in right shoulder: Secondary | ICD-10-CM | POA: Diagnosis not present

## 2018-06-06 NOTE — Therapy (Signed)
[facility] [address] [place], [address] Phone: [phone]   Fax:  [phone]  Physical Therapy Treatment  Patient Details  Name: ENZLEY KITCHENS MRN: [id number] Date of Birth: 1966-01-25 Referring Provider (PT): Derrel Nip MD   Encounter Date: 06/06/2018  PT End of Session - 06/06/18 1156    Visit Number  16    Number of Visits  33    Date for PT Re-Evaluation  07/12/18    PT Start Time  1115    PT Stop Time  1200    PT Time Calculation (min)  45 min    Activity Tolerance  Patient tolerated treatment well    Behavior During Therapy  [facility] for tasks assessed/performed       Past Medical History:  Diagnosis Date  . Fibroid   . GERD (gastroesophageal reflux disease) Aug 2012   with esophagitis by EGD  . Hematuria   . Hx of colonic polyps August 2012   repeat due 2016,  5 polyps 2009, clear 2012 Gustavo Lah)  . Menorrhagia    did not tolerate trial of ocps due to migraines  . Migraine headache   . Migraine syndrome    since age 58,  sporadic    Past Surgical History:  Procedure Laterality Date  . BREAST BIOPSY Right 2017   benign  . CYSTOSCOPY    . laparoscopy    . VAGINAL HYSTERECTOMY  6/13   with morcellation    There were no vitals filed for this visit.  Subjective Assessment - 06/06/18 1120    Subjective  Patient reports decreased pain overall, with motion that "she does not think is getting worse". Reports minimal compliance with HEP     Pertinent History  Patient is a 53 year old female with R shoulder pain that began August of this year when she took something out of her pocket and had intense lateral shoulder pain with "tingling". Patient reports pain was intermittent through Sept, but in Oct became pretty constant. Patient reports pain is sharp at the lateral shoulder, with a dull ache in ant and post shoulder, and that she is unable to sleep on that side. Patient reports most pain with  "bringing her arm out to the side, with pain with behind the back motions as well. Patient reports shearing pain at lateral shoulder, with dull ache in post and ant shoulder- occasssional hand "tingling" and forearm ache. Patient reported worst pain over the past week 8/10; best 3/10. Pt denies N/V, B&B, unexplained weight fluctuation, saddle paresthesia, fever, night sweats, or unrelenting night pain at this time.    Limitations  Lifting;House hold activities    How long can you sit comfortably?  unlimited    How long can you stand comfortably?  unlimited    How long can you walk comfortably?  unlimited    Pain Onset  More than a month ago            Ther-Ex - Pulleys x20 flexion; x20 abd with min cuing for 3sec hold - Prone T's 3x 10 with TC for full scapular retraction with good carry over following - Push Up plus from knees 3x10 with TC and VC initially for proper muscle activation, with good carry over follow   Manual - STM withtrigger point releaseto, R UT,andRmedialdeltoid. (3)31m .30needles placed along theRlevatorand R UT with pincer grasptechniqueto decrease increased muscular spasms and trigger points with the patient positioned inprone (UT).Good local twitch  response noted.Patient was educated on risks and benefits of therapy and verbally consents to PT. - Multiple bouts of PROM into flex/abd/IR/ER (rotation at80d of abd) with 5sec holds in max range of patient tolerance; severely limited      ESTIM + heat pack HiVolt ESTIM 10 min at patient tolerated 95V increased to 110V through treatment at [place] area . Attempted d/t success of treatment at previous session success. With PT assessing patient tolerance throughout (increasing intensity as needed), monitoring skin integrity (normal), with decreased pain noted from patient. Review of HEP additions during this time                           PT Education - 06/06/18 1156     Education Details  Exercise technique, HEP review    Person(s) Educated  Patient    Methods  Explanation;Demonstration;Tactile cues;Verbal cues    Comprehension  Verbalized understanding;Returned demonstration;Verbal cues required;Tactile cues required       PT Short Term Goals - 03/22/18 2136      PT SHORT TERM GOAL #1   Title  Pt will be independent with HEP in order to improve strength and balance in order to decrease fall risk and improve function at home and work    Time  4    Period  Weeks    Status  New        PT Long Term Goals - 05/22/18 1658      PT LONG TERM GOAL #1   Title  Patient will increase FOTO score to 71 to demonstrate predicted increase in functional mobility to complete ADLs    Baseline  05/22/18 51    Time  8    Period  Weeks    Status  On-going      PT LONG TERM GOAL #2   Title  Patient will demonstrate symmetrical, full gross shoulder AROM in order to return to PLOF and complete ADLs    Baseline  05/22/18: L wnl all motions R: flex 121d ; abd 75d ; IR L1 ; ER CTJ    Time  8    Period  Weeks    Status  On-going      PT LONG TERM GOAL #3   Title  Patient will demonstrate gross shoulder strength 4+/5 or better to return to PLOF, and to be able to safely complete     Baseline  05/22/18 R ER 5/5 ; IR 4+/5 flex 4+/5 ; abd 4+/5    Time  8    Period  Weeks    Status  Partially Met      PT LONG TERM GOAL #4   Title  Pt will decrease worst pain as reported on NPRS by at least 3 points in order to demonstrate clinically significant reduction in pain.    Baseline  05/22/18 2/10    Time  8    Period  Weeks    Status  Achieved            Plan - 06/06/18 1504    Clinical Impression Statement  Patient is continuing to respond well to manual and modality techniques for pain management with improved shoulder ROM following. Patient is optimistic about progress with decreased pain, and continuing to work on increasing ROM and strengthening. PT continued  periscapular strengthening progression with patient able to complete with accuracy following PT cuing.        Patient will benefit from skilled  therapeutic intervention in order to improve the following deficits and impairments:     Visit Diagnosis: Chronic right shoulder pain     Problem List Patient Active Problem List   Diagnosis Date Noted  . Encounter for preventative adult health care exam with abnormal findings 05/27/2018  . Raynaud phenomenon 05/28/2017  . Generalized anxiety disorder 05/29/2016  . S/P hysterectomy 05/29/2016  . Headache 04/14/2016  . History of mammogram 03/02/2011  . Migraine syndrome   . Hx of colonic polyps   . GERD (gastroesophageal reflux disease) 12/16/2010    Shelton Silvas 06/06/2018, 5:54 PM  [facility] [address] [place], [address] Phone: [phone]   Fax:  [phone]  Name: JULL HARRAL MRN: [id number] Date of Birth: Oct 12, 1965

## 2018-06-07 DIAGNOSIS — Z800 Family history of malignant neoplasm of digestive organs: Secondary | ICD-10-CM | POA: Diagnosis not present

## 2018-06-07 DIAGNOSIS — K219 Gastro-esophageal reflux disease without esophagitis: Secondary | ICD-10-CM | POA: Diagnosis not present

## 2018-06-13 ENCOUNTER — Ambulatory Visit: Payer: 59 | Admitting: Physical Therapy

## 2018-06-13 ENCOUNTER — Ambulatory Visit: Payer: 59 | Admitting: Family Medicine

## 2018-06-13 ENCOUNTER — Ambulatory Visit (INDEPENDENT_AMBULATORY_CARE_PROVIDER_SITE_OTHER)
Admission: RE | Admit: 2018-06-13 | Discharge: 2018-06-13 | Disposition: A | Discharge status: Home / Self Care | Payer: 59 | Source: Ambulatory Visit | Attending: Family Medicine | Admitting: Family Medicine

## 2018-06-13 ENCOUNTER — Encounter: Payer: Self-pay | Admitting: Family Medicine

## 2018-06-13 ENCOUNTER — Ambulatory Visit: Payer: Self-pay

## 2018-06-13 VITALS — BP 132/82 | HR 106 | Ht 62.0 in | Wt 111.0 lb

## 2018-06-13 DIAGNOSIS — M7501 Adhesive capsulitis of right shoulder: Secondary | ICD-10-CM

## 2018-06-13 DIAGNOSIS — G8929 Other chronic pain: Secondary | ICD-10-CM

## 2018-06-13 DIAGNOSIS — M25511 Pain in right shoulder: Secondary | ICD-10-CM

## 2018-06-13 DIAGNOSIS — M7500 Adhesive capsulitis of unspecified shoulder: Secondary | ICD-10-CM | POA: Insufficient documentation

## 2018-06-13 MED ORDER — DICLOFENAC SODIUM 2 % TD SOLN: 2.0000 g | Freq: Two times a day (BID) | TRANSDERMAL | 3 refills | Status: DC

## 2018-06-13 MED ORDER — VITAMIN D (ERGOCALCIFEROL) 1.25 MG (50000 UNIT) PO CAPS: 50000.0000 [IU] | ORAL_CAPSULE | ORAL | 0 refills | Status: DC

## 2018-06-13 NOTE — Assessment & Plan Note (Signed)
Frozen shoulder.  Discussed with patient in great length.  Discussed the possibility of injection but held at this time because there is a possibility of arthritic changes as well.  Differential also includes a very large labral pathology with calcific changes.  Discussed with patient about home exercises, topical anti-inflammatories, once weekly vitamin D, x-rays pending.  Follow-up with me again in 4 weeks

## 2018-06-13 NOTE — Patient Instructions (Signed)
Good to see you  Hard to say if a lot of calcium or arthritis, the xray downstairs will help  Ice 20 minutes 2 times daily. Usually after activity and before bed. pennsaid pinkie amount topically 2 times daily as needed.  Once weekly vitamin D for 12 weeks.  Exercises 3 times a week.  Keep going to PT  I will write you about the xrays See me again in 4 weeks

## 2018-06-13 NOTE — Progress Notes (Signed)
[facility] [address] [place], [place] [address] Phone: [phone] Subjective:   Fontaine No, am serving as a scribe for Dr. Hulan Saas.   CC: Right shoulder pain  BJY:[id number]  Stephanie Henderson is a 53 y.o. female coming in with complaint of right shoulder pain. Did move a couple of times this summer. May have injured it during the moves. Patient feels pain anteriorly. Pain can radiated from anterior to posterior. First noted the pain in August when she took a check out of her pocket. Patient has been doing PT since November. Pain has decreased but motion has also decreased. Painful with flexion, IR and abduction. Intermittent short duration radicular symptoms in right hand. Has been using IBU for pain management.       Past Medical History:  Diagnosis Date  . Fibroid   . GERD (gastroesophageal reflux disease) Aug 2012   with esophagitis by EGD  . Hematuria   . Hx of colonic polyps August 2012   repeat due 2016,  5 polyps 2009, clear 2012 Gustavo Lah)  . Menorrhagia    did not tolerate trial of ocps due to migraines  . Migraine headache   . Migraine syndrome    since age 15,  sporadic   Past Surgical History:  Procedure Laterality Date  . BREAST BIOPSY Right 2017   benign  . CYSTOSCOPY    . laparoscopy    . VAGINAL HYSTERECTOMY  6/13   with morcellation   Social History   Socioeconomic History  . Marital status: Married    Spouse name: Not on file  . Number of children: 2  . Years of education: Not on file  . Highest education level: Not on file  Occupational History  . Occupation: Therapist, sports - [employer]  Social Needs  . Financial resource strain: Not on file  . Food insecurity:    Worry: Not on file    Inability: Not on file  . Transportation needs:    Medical: Not on file    Non-medical: Not on file  Tobacco Use  . Smoking status: Never Smoker  . Smokeless tobacco: Never Used  Substance and Sexual Activity    . Alcohol use: Yes    Alcohol/week: 4.0 - 5.0 standard drinks    Types: 4 - 5 Standard drinks or equivalent per week    Comment: OCCAS  . Drug use: No  . Sexual activity: Yes    Birth control/protection: Surgical  Lifestyle  . Physical activity:    Days per week: 2 days    Minutes per session: 30 min  . Stress: Not on file  Relationships  . Social connections:    Talks on phone: Not on file    Gets together: Not on file    Attends religious service: Not on file    Active member of club or organization: Not on file    Attends meetings of clubs or organizations: Not on file    Relationship status: Not on file  Other Topics Concern  . Not on file  Social History Narrative  . Not on file   Allergies  Allergen Reactions  . Propoxyphene     Other reaction(s): Other (See Comments) Unknown  . Tetracyclines & Related Diarrhea and Nausea Only   Family History  Problem Relation Age of Onset  . Hyperlipidemia Mother   . Mental illness Mother        depression  . Colon cancer Father   .  Depression Father   . Cancer Father 20       mets to lung   . Mental illness Father   . Kidney disease Daughter   . Diabetes Neg Hx   . Heart disease Neg Hx   . Breast cancer Neg Hx   . Ovarian cancer Neg Hx       Current Outpatient Medications (Respiratory):  .  fluticasone (FLONASE) 50 MCG/ACT nasal spray, Place 1 spray into both nostrils 2 (two) times daily for 10 days.  Current Outpatient Medications (Analgesics):  Marland Kitchen  SUMAtriptan (IMITREX) 100 MG tablet, Take 1 tablet (100 mg total) by mouth once for 1 dose. May repeat in 2 hours if needed   Current Outpatient Medications (Other):  Marland Kitchen  ALPRAZolam (XANAX) 0.5 MG tablet, Take 1 tablet (0.5 mg total) by mouth at bedtime as needed for anxiety. .  cyclobenzaprine (FLEXERIL) 10 MG tablet, Take 1 tablet (10 mg total) by mouth at bedtime. .  famotidine (PEPCID) 40 MG tablet, Take 1 tablet (40 mg total) by mouth daily. .  pantoprazole  (PROTONIX) 40 MG tablet, Take 1 tablet (40 mg total) by mouth daily. .  sucralfate (CARAFATE) 1 G tablet, Take 1 g by mouth 3 (three) times daily as needed.   .  Diclofenac Sodium 2 % SOLN, Place 2 g onto the skin 2 (two) times daily. .  Vitamin D, Ergocalciferol, (DRISDOL) 1.25 MG (50000 UT) CAPS capsule, Take 1 capsule (50,000 Units total) by mouth every 7 (seven) days.    Past medical history, social, surgical and family history all reviewed in electronic medical record.  No pertanent information unless stated regarding to the chief complaint.   Review of Systems:  No headache, visual changes, nausea, vomiting, diarrhea, constipation, dizziness, abdominal pain, skin rash, fevers, chills, night sweats, weight loss, swollen lymph nodes, body aches, joint swelling, chest pain, shortness of breath, mood changes.  Positive muscle aches  Objective  Blood pressure 132/82, pulse (!) 106, height 5\' 2"  (1.575 m), weight 111 lb (50.3 kg), last menstrual period 02/20/2011, SpO2 97 %.   General: No apparent distress alert and oriented x3 mood and affect normal, dressed appropriately.  HEENT: Pupils equal, extraocular movements intact  Respiratory: Patient's speak in full sentences and does not appear short of breath  Cardiovascular: No lower extremity edema, non tender, no erythema  Skin: Warm dry intact with no signs of infection or rash on extremities or on axial skeleton.  Abdomen: Soft nontender  Neuro: Cranial nerves II through XII are intact, neurovascularly intact in all extremities with 2+ DTRs and 2+ pulses.  Lymph: No lymphadenopathy of posterior or anterior cervical chain or axillae bilaterally.  Gait normal with good balance and coordination.  MSK:  Non tender with full range of motion and good stability and symmetric strength and tone of , elbows, wrist, hip, knee and ankles bilaterally.  Right shoulder exam shows some mild atrophy of the musculature compared to the contralateral side.   Decreased range of motion in all planes of at least 5 degrees.  Forward flexion of 140 degrees, internal rotation to lateral hip, external rotation of 5 degrees.  Mild crepitus noted.  Strength 4+ out of 5 compared to the contralateral side Contralateral shoulder unremarkable  MSK US performed of: Right shoulder This study was ordered, performed, and interpreted by Charlann Boxer D.O.  Shoulder:   Supraspinatus: Significant calcific changes but no true tear appreciated. AC joint: Calcific changes noted.. Glenoid Labrum: Possible large posterior tear noted.  Possible narrowing of the glenohumeral joint noted Biceps Tendon: Hypoechoic changes within the tendon sheath.   97110; 15 additional minutes spent for Therapeutic exercises as stated in above notes.  This included exercises focusing on stretching, strengthening, with significant focus on eccentric aspects.   Long term goals include an improvement in range of motion, strength, endurance as well as avoiding reinjury. Patient's frequency would include in 1-2 times a day, 3-5 times a week for a duration of 6-12 weeks. Shoulder Exercises that included:  Basic scapular stabilization to include adduction and depression of scapula Scaption, focusing on proper movement and good control Internal and External rotation utilizing a theraband, with elbow tucked at side entire time Rows with theraband given    Proper technique shown and discussed handout in great detail with ATC.  All questions were discussed and answered.     Impression and Recommendations:     This case required medical decision making of moderate complexity. The above documentation has been reviewed and is accurate and complete Lyndal Pulley, DO       Note: This dictation was prepared with Dragon dictation along with smaller phrase technology. Any transcriptional errors that result from this process are unintentional.

## 2018-06-14 ENCOUNTER — Encounter: Payer: Self-pay | Admitting: Physical Therapy

## 2018-06-14 ENCOUNTER — Ambulatory Visit: Payer: 59 | Admitting: Physical Therapy

## 2018-06-14 DIAGNOSIS — M25511 Pain in right shoulder: Secondary | ICD-10-CM | POA: Diagnosis not present

## 2018-06-14 DIAGNOSIS — G8929 Other chronic pain: Secondary | ICD-10-CM

## 2018-06-14 NOTE — Therapy (Signed)
[facility] [address] [place], [address] Phone: [phone]   Fax:  [phone]  Physical Therapy Treatment  Patient Details  Name: MCKINZEE SPIRITO MRN: [id number] Date of Birth: [birth year] Referring Provider (PT): Derrel Nip MD   Encounter Date: 06/14/2018  PT End of Session - 06/14/18 0849    Visit Number  17    Number of Visits  33    Date for PT Re-Evaluation  07/12/18    PT Start Time  0815    PT Stop Time  0902    PT Time Calculation (min)  47 min    Activity Tolerance  Patient tolerated treatment well    Behavior During Therapy  [facility] for tasks assessed/performed       Past Medical History:  Diagnosis Date  . Fibroid   . GERD (gastroesophageal reflux disease) Aug 2012   with esophagitis by EGD  . Hematuria   . Hx of colonic polyps August 2012   repeat due 2016,  5 polyps 2009, clear 2012 Gustavo Lah)  . Menorrhagia    did not tolerate trial of ocps due to migraines  . Migraine headache   . Migraine syndrome    since age 89,  sporadic    Past Surgical History:  Procedure Laterality Date  . BREAST BIOPSY Right 2017   benign  . CYSTOSCOPY    . laparoscopy    . VAGINAL HYSTERECTOMY  6/13   with morcellation    There were no vitals filed for this visit.  Subjective Assessment - 06/14/18 0821    Subjective  Patient reports minimal pain which she continues to report is getting progressively better. Patient reports motion still feels stiff. Compliance with HEP with no questions or concerns.     Pertinent History  Patient is a 53 year old female with R shoulder pain that began August of this year when she took something out of her pocket and had intense lateral shoulder pain with "tingling". Patient reports pain was intermittent through Sept, but in Oct became pretty constant. Patient reports pain is sharp at the lateral shoulder, with a dull ache in ant and post shoulder, and that she is unable to  sleep on that side. Patient reports most pain with "bringing her arm out to the side, with pain with behind the back motions as well. Patient reports shearing pain at lateral shoulder, with dull ache in post and ant shoulder- occasssional hand "tingling" and forearm ache. Patient reported worst pain over the past week 8/10; best 3/10. Pt denies N/V, B&B, unexplained weight fluctuation, saddle paresthesia, fever, night sweats, or unrelenting night pain at this time.    Limitations  Lifting;House hold activities    How long can you stand comfortably?  unlimited    How long can you walk comfortably?  unlimited    Diagnostic tests  none at this time    Pain Onset  More than a month ago          Ther-Ex - Pulleys x20 flexion; x20 abd with min cuing for 3sec hold - Prone T's 3x 10 with cuing to prevent rotation compensation with 75% carry over with difficulty d/t weakness -Serratus punch 2# 3x 10 with min cuing for eccentric control with good carry over following  Manual - STM withtrigger point releaseto, R UT,andRmedialdeltoid. (3)15m .30needles placed along the R UT with pincer grasptechniqueto decrease increased muscular spasms and trigger points with the patient positioned inprone and supine.Good  local twitch response noted.Patient was educated on risks and benefits of therapy and verbally consents to PT. - Multiple bouts of PROM into flex/abd/IR/ER (rotation at80d of abd) with 5sec holds in max range of patient tolerance; severely limited   ESTIM+ heat packHiVolt ESTIM10 min at patient tolerated150Vincreased to160V through treatmentat R UT and middle deltoidarea. Attempted d/t success of treatment at previous session success. With PT assessing patient tolerance throughout (increasing intensity as needed), monitoring skin integrity (normal), with decreased pain noted from patient. Review of HEP given from ortho MD with modifications to decrease amount of exercises  patient is responsible for at home                       PT Education - 06/14/18 0845    Education Details  Exercise form    Person(s) Educated  Patient    Methods  Explanation;Demonstration;Tactile cues;Verbal cues    Comprehension  Verbalized understanding;Returned demonstration;Verbal cues required;Tactile cues required       PT Short Term Goals - 03/22/18 2136      PT SHORT TERM GOAL #1   Title  Pt will be independent with HEP in order to improve strength and balance in order to decrease fall risk and improve function at home and work    Time  4    Period  Weeks    Status  New        PT Long Term Goals - 05/22/18 1658      PT LONG TERM GOAL #1   Title  Patient will increase FOTO score to 71 to demonstrate predicted increase in functional mobility to complete ADLs    Baseline  05/22/18 51    Time  8    Period  Weeks    Status  On-going      PT LONG TERM GOAL #2   Title  Patient will demonstrate symmetrical, full gross shoulder AROM in order to return to PLOF and complete ADLs    Baseline  05/22/18: L wnl all motions R: flex 121d ; abd 75d ; IR L1 ; ER CTJ    Time  8    Period  Weeks    Status  On-going      PT LONG TERM GOAL #3   Title  Patient will demonstrate gross shoulder strength 4+/5 or better to return to PLOF, and to be able to safely complete     Baseline  05/22/18 R ER 5/5 ; IR 4+/5 flex 4+/5 ; abd 4+/5    Time  8    Period  Weeks    Status  Partially Met      PT LONG TERM GOAL #4   Title  Pt will decrease worst pain as reported on NPRS by at least 3 points in order to demonstrate clinically significant reduction in pain.    Baseline  05/22/18 2/10    Time  8    Period  Weeks    Status  Achieved            Plan - 06/14/18 0902    Clinical Impression Statement  Patient is continuing to have steady reduction in pain and soft tissue restriction, with continued ROM difficulty and deficits. Patient reports she saw orthopedic MD yesterday  that confirmed adhesive capsulitis suspicion, and performed ultrasound noting possible adhesion breakdown. PT and patient reviewed HEP from PT and from MD to consolidate therex at home, which patient verbalized understanding of. MD confirms that patient has  abnormal presentation for adhesive capulsitis, but encouraged patient to continue course as she is making slow progress. PT will continue techniques for pain management and for soft tissue restrictions, progressing therex as tolerated/is indicated    Rehab Potential  Good    Clinical Impairments Affecting Rehab Potential  (+) fairly yong age, motivation (-) Sedentary lifestyle, chronicity of pain, hypersensitivity, psycho-social factors    PT Frequency  2x / week    PT Treatment/Interventions  Moist Heat;Traction;Ultrasound;Cryotherapy;Fluidtherapy;Electrical Stimulation;Therapeutic exercise;Taping;Patient/family education;Therapeutic activities;Functional mobility training;Neuromuscular re-education;Passive range of motion;Dry needling;Manual techniques    PT Next Visit Plan   manual techniques for soft tissue restriction/mobility, postural re-training, periscapular strengthening    PT Home Exercise Plan  Supine pec stretch, supine IR stretch, pulleys AAROM flex/abd, scapular retractions    Consulted and Agree with Plan of Care  Patient       Patient will benefit from skilled therapeutic intervention in order to improve the following deficits and impairments:  Improper body mechanics, Pain, Postural dysfunction, Impaired tone, Decreased activity tolerance, Decreased endurance, Decreased range of motion, Decreased strength, Impaired UE functional use  Visit Diagnosis: Chronic right shoulder pain     Problem List Patient Active Problem List   Diagnosis Date Noted  . Frozen shoulder syndrome 06/13/2018  . Encounter for preventative adult health care exam with abnormal findings 05/27/2018  . Raynaud phenomenon 05/28/2017  . Generalized  anxiety disorder 05/29/2016  . S/P hysterectomy 05/29/2016  . Headache 04/14/2016  . History of mammogram 03/02/2011  . Migraine syndrome   . Hx of colonic polyps   . GERD (gastroesophageal reflux disease) 12/16/2010   Shelton Silvas PT, DPT Shelton Silvas 06/14/2018, 9:44 AM  [facility] [address] [place], [address] Phone: [phone]   Fax:  [phone]  Name: CHARDAE MULKERN MRN: [id number] Date of Birth: 12-15-1965

## 2018-06-19 ENCOUNTER — Ambulatory Visit
Admission: RE | Admit: 2018-06-19 | Discharge: 2018-06-19 | Disposition: A | Discharge status: Home / Self Care | Payer: 59 | Source: Ambulatory Visit | Attending: Internal Medicine | Admitting: Internal Medicine

## 2018-06-19 DIAGNOSIS — N6489 Other specified disorders of breast: Secondary | ICD-10-CM | POA: Diagnosis not present

## 2018-06-19 DIAGNOSIS — N6459 Other signs and symptoms in breast: Secondary | ICD-10-CM

## 2018-06-19 DIAGNOSIS — R922 Inconclusive mammogram: Secondary | ICD-10-CM | POA: Diagnosis not present

## 2018-06-20 ENCOUNTER — Encounter: Payer: Self-pay | Admitting: Physical Therapy

## 2018-06-20 ENCOUNTER — Telehealth: Payer: 59 | Admitting: Physician Assistant

## 2018-06-20 ENCOUNTER — Ambulatory Visit: Payer: 59 | Attending: Internal Medicine | Admitting: Physical Therapy

## 2018-06-20 DIAGNOSIS — G8929 Other chronic pain: Secondary | ICD-10-CM | POA: Diagnosis not present

## 2018-06-20 DIAGNOSIS — J019 Acute sinusitis, unspecified: Secondary | ICD-10-CM

## 2018-06-20 DIAGNOSIS — B9689 Other specified bacterial agents as the cause of diseases classified elsewhere: Secondary | ICD-10-CM | POA: Diagnosis not present

## 2018-06-20 DIAGNOSIS — M25511 Pain in right shoulder: Secondary | ICD-10-CM | POA: Diagnosis not present

## 2018-06-20 MED ORDER — LEVOFLOXACIN 500 MG PO TABS: 500.0000 mg | ORAL_TABLET | Freq: Every day | ORAL | 0 refills | Status: DC

## 2018-06-20 NOTE — Progress Notes (Signed)

## 2018-06-20 NOTE — Therapy (Signed)
[facility] [address] [place], [address] Phone: [phone]   Fax:  [phone]  Physical Therapy Treatment  Patient Details  Name: Stephanie Henderson MRN: [id number] Date of Birth: Oct 03, 1965 Referring Provider (PT): Derrel Nip MD   Encounter Date: 06/20/2018  PT End of Session - 06/20/18 1612    Visit Number  18    Number of Visits  33    Date for PT Re-Evaluation  07/12/18    PT Start Time  0345    PT Stop Time  0430    PT Time Calculation (min)  45 min    Activity Tolerance  Patient tolerated treatment well    Behavior During Therapy  [facility] for tasks assessed/performed       Past Medical History:  Diagnosis Date  . Fibroid   . GERD (gastroesophageal reflux disease) Aug 2012   with esophagitis by EGD  . Hematuria   . Hx of colonic polyps August 2012   repeat due 2016,  5 polyps 2009, clear 2012 Gustavo Lah)  . Menorrhagia    did not tolerate trial of ocps due to migraines  . Migraine headache   . Migraine syndrome    since age 80,  sporadic    Past Surgical History:  Procedure Laterality Date  . BREAST BIOPSY Right 2017   benign  . CYSTOSCOPY    . laparoscopy    . VAGINAL HYSTERECTOMY  6/13   with morcellation    There were no vitals filed for this visit.  Subjective Assessment - 06/20/18 1547    Subjective  Patient reports only 1/10 pain with slight improvement in ROM, remaining difficulty with "behind the back ROM    Pertinent History  Patient is a 53 year old female with R shoulder pain that began August of this year when she took something out of her pocket and had intense lateral shoulder pain with "tingling". Patient reports pain was intermittent through Sept, but in Oct became pretty constant. Patient reports pain is sharp at the lateral shoulder, with a dull ache in ant and post shoulder, and that she is unable to sleep on that side. Patient reports most pain with "bringing her arm out to  the side, with pain with behind the back motions as well. Patient reports shearing pain at lateral shoulder, with dull ache in post and ant shoulder- occasssional hand "tingling" and forearm ache. Patient reported worst pain over the past week 8/10; best 3/10. Pt denies N/V, B&B, unexplained weight fluctuation, saddle paresthesia, fever, night sweats, or unrelenting night pain at this time.    Limitations  Lifting;House hold activities    How long can you sit comfortably?  unlimited    How long can you stand comfortably?  unlimited    How long can you walk comfortably?  unlimited    Pain Onset  More than a month ago         Ther-Ex - Pulleys x20 flexion; x20 abd with min cuing for 3sec hold -Prone T's 3x 10 with cuing to prevent rotation compensation with 75% carry over with difficulty d/t weakness -Serratus punch 3# 3x 10 with good carry over from previous session  Manual - STM withtrigger point releaseto, R UT,R teres minor/latissimus andRmedialdeltoid. (3)56m .30needles placed along the R UT and R medial deltoid with pincer grasptechniqueto decrease increased muscular spasms and trigger points with the patient positioned inprone and supine.Good local twitch response noted.Patient was educated on risks and  benefits of therapy and verbally consents to PT. - Multiple bouts of PROM into flex/abd/IR/ER (rotation at80d of abd) with 10sec holds in max range of patient tolerance - GHJ PA grade III mobs 30sec bouts 8 bouts in 80d of abd  ESTIM+ heat packHiVolt ESTIM60mn at patient tolerated135Vincreased to145V through treatmentat R UT and middle deltoidarea. Attempted d/t success of treatment at previous session success. With PT assessing patient tolerance throughout (increasing intensity as needed), monitoring skin integrity (normal), with decreased pain noted from patient following and slight increase in IR and ER                       PT  Education - 06/20/18 1612    Education Details  Exercise form    Person(s) Educated  Patient    Methods  Explanation;Demonstration;Verbal cues    Comprehension  Verbalized understanding;Returned demonstration;Verbal cues required       PT Short Term Goals - 03/22/18 2136      PT SHORT TERM GOAL #1   Title  Pt will be independent with HEP in order to improve strength and balance in order to decrease fall risk and improve function at home and work    Time  4    Period  Weeks    Status  New        PT Long Term Goals - 05/22/18 1658      PT LONG TERM GOAL #1   Title  Patient will increase FOTO score to 71 to demonstrate predicted increase in functional mobility to complete ADLs    Baseline  05/22/18 51    Time  8    Period  Weeks    Status  On-going      PT LONG TERM GOAL #2   Title  Patient will demonstrate symmetrical, full gross shoulder AROM in order to return to PLOF and complete ADLs    Baseline  05/22/18: L wnl all motions R: flex 121d ; abd 75d ; IR L1 ; ER CTJ    Time  8    Period  Weeks    Status  On-going      PT LONG TERM GOAL #3   Title  Patient will demonstrate gross shoulder strength 4+/5 or better to return to PLOF, and to be able to safely complete     Baseline  05/22/18 R ER 5/5 ; IR 4+/5 flex 4+/5 ; abd 4+/5    Time  8    Period  Weeks    Status  Partially Met      PT LONG TERM GOAL #4   Title  Pt will decrease worst pain as reported on NPRS by at least 3 points in order to demonstrate clinically significant reduction in pain.    Baseline  05/22/18 2/10    Time  8    Period  Weeks    Status  Achieved            Plan - 06/20/18 1629    Clinical Impression Statement  PT continued pain management and ROM as able with continued postural muscle progression, encouraging carry over with HEP to maintain this. Patient reports she has not been doing her IR/ER AAROM as much as her other HEP exercises. PT encouraged this and educated patient on stages and time  frame of adhessive capsulitis, with encouragement that in the next 4-8 weeks patient should be able to start seeing improvements is she continually works on ROM.  Rehab Potential  Good    Clinical Impairments Affecting Rehab Potential  (+) fairly yong age, motivation (-) Sedentary lifestyle, chronicity of pain, hypersensitivity, psycho-social factors    PT Frequency  2x / week    PT Duration  8 weeks    PT Treatment/Interventions  Moist Heat;Traction;Ultrasound;Cryotherapy;Fluidtherapy;Electrical Stimulation;Therapeutic exercise;Taping;Patient/family education;Therapeutic activities;Functional mobility training;Neuromuscular re-education;Passive range of motion;Dry needling;Manual techniques    PT Next Visit Plan   manual techniques for soft tissue restriction/mobility, postural re-training, periscapular strengthening    PT Home Exercise Plan  Supine pec stretch, supine IR stretch, pulleys AAROM flex/abd, scapular retractions    Consulted and Agree with Plan of Care  Patient       Patient will benefit from skilled therapeutic intervention in order to improve the following deficits and impairments:  Improper body mechanics, Pain, Postural dysfunction, Impaired tone, Decreased activity tolerance, Decreased endurance, Decreased range of motion, Decreased strength, Impaired UE functional use  Visit Diagnosis: Chronic right shoulder pain     Problem List Patient Active Problem List   Diagnosis Date Noted  . Frozen shoulder syndrome 06/13/2018  . Encounter for preventative adult health care exam with abnormal findings 05/27/2018  . Raynaud phenomenon 05/28/2017  . Generalized anxiety disorder 05/29/2016  . S/P hysterectomy 05/29/2016  . Headache 04/14/2016  . History of mammogram 03/02/2011  . Migraine syndrome   . Hx of colonic polyps   . GERD (gastroesophageal reflux disease) 12/16/2010   Shelton Silvas PT, DPT Shelton Silvas 06/20/2018, 4:31 PM  [facility] [address] [place], [address] Phone: [phone]   Fax:  [phone]  Name: Stephanie Henderson MRN: [id number] Date of Birth: 1965/12/20

## 2018-06-27 ENCOUNTER — Encounter: Payer: Self-pay | Admitting: Physical Therapy

## 2018-06-27 ENCOUNTER — Encounter: Payer: 59 | Admitting: Physical Therapy

## 2018-06-27 ENCOUNTER — Ambulatory Visit: Payer: 59 | Admitting: Physical Therapy

## 2018-06-27 DIAGNOSIS — M25511 Pain in right shoulder: Principal | ICD-10-CM

## 2018-06-27 DIAGNOSIS — G8929 Other chronic pain: Secondary | ICD-10-CM

## 2018-06-27 NOTE — Therapy (Signed)
[facility] [address], [place], [address] Phone: [phone]   Fax:  [phone]  Physical Therapy Treatment  Patient Details  Name: Stephanie Henderson MRN: [id number] Date of Birth: [birth year] Referring Provider (PT): Derrel Nip MD   Encounter Date: 06/27/2018  PT End of Session - 06/27/18 1628    Visit Number  19    Number of Visits  33    Date for PT Re-Evaluation  07/12/18    PT Start Time  0400    PT Stop Time  0445    PT Time Calculation (min)  45 min    Activity Tolerance  Patient tolerated treatment well    Behavior During Therapy  [facility] for tasks assessed/performed       Past Medical History:  Diagnosis Date  . Fibroid   . GERD (gastroesophageal reflux disease) Aug 2012   with esophagitis by EGD  . Hematuria   . Hx of colonic polyps August 2012   repeat due 2016,  5 polyps 2009, clear 2012 Gustavo Lah)  . Menorrhagia    did not tolerate trial of ocps due to migraines  . Migraine headache   . Migraine syndrome    since age 65,  sporadic    Past Surgical History:  Procedure Laterality Date  . BREAST BIOPSY Right 2017   benign  . CYSTOSCOPY    . laparoscopy    . VAGINAL HYSTERECTOMY  6/13   with morcellation    There were no vitals filed for this visit.  Subjective Assessment - 06/27/18 1605    Subjective  Reports minimal pain, mostly soreness in the shoulder. Patient reports she thinks her "out to the side motion is getting better".     Pertinent History  Patient is a 53 year old female with R shoulder pain that began August of this year when she took something out of her pocket and had intense lateral shoulder pain with "tingling". Patient reports pain was intermittent through Sept, but in Oct became pretty constant. Patient reports pain is sharp at the lateral shoulder, with a dull ache in ant and post shoulder, and that she is unable to sleep on that side. Patient reports most pain with  "bringing her arm out to the side, with pain with behind the back motions as well. Patient reports shearing pain at lateral shoulder, with dull ache in post and ant shoulder- occasssional hand "tingling" and forearm ache. Patient reported worst pain over the past week 8/10; best 3/10. Pt denies N/V, B&B, unexplained weight fluctuation, saddle paresthesia, fever, night sweats, or unrelenting night pain at this time.    Limitations  Lifting;House hold activities    How long can you sit comfortably?  unlimited    How long can you stand comfortably?  unlimited    How long can you walk comfortably?  unlimited    Diagnostic tests  none at this time    Pain Onset  More than a month ago       Ther-Ex - Pulleys x20 flexion; x20 abd with min cuing for 3sec hold - AROM flex 2x10; abd 2x 10 with patient able to work through 1 "catch" to demonstrate 90d of flex and abd, which she is happy with. Apleys IR to L1; ER to C7  Manual - STM withtrigger point releaseto, R UT, R bicep, and R teres minor/latissimus andRmedialdeltoid. (3)63m .30needles placed along the R UT, R bicep and R medial deltoid with pincer grasptechniqueto  decrease increased muscular spasms and trigger points with the patient positioned inprone and supine.Good local twitch response noted.Patient was educated on risks and benefits of therapy and verbally consents to PT. - Multiple bouts of PROM into flex/abd/IR/ER (rotation at80d of abd) with 10sec holds in max range of patient tolerance - GHJ PA grade III mobs 30sec bouts 8 bouts in 80d of abd  ESTIM+ heat packHiVolt ESTIM30mn at patient tolerated105Vincreased to115V through treatmentatR UT and middle deltoidarea. Attempted d/t success of treatment at previous session success. With PT assessing patient tolerance throughout (increasing intensity as needed), monitoring skin integrity (normal), with decreased pain noted from patient following and slight increase in  IR and ER                        PT Education - 06/27/18 1628    Education Details  Exercise form    Person(s) Educated  Patient    Methods  Explanation;Verbal cues    Comprehension  Verbalized understanding;Verbal cues required       PT Short Term Goals - 03/22/18 2136      PT SHORT TERM GOAL #1   Title  Pt will be independent with HEP in order to improve strength and balance in order to decrease fall risk and improve function at home and work    Time  4    Period  Weeks    Status  New        PT Long Term Goals - 05/22/18 1658      PT LONG TERM GOAL #1   Title  Patient will increase FOTO score to 71 to demonstrate predicted increase in functional mobility to complete ADLs    Baseline  05/22/18 51    Time  8    Period  Weeks    Status  On-going      PT LONG TERM GOAL #2   Title  Patient will demonstrate symmetrical, full gross shoulder AROM in order to return to PLOF and complete ADLs    Baseline  05/22/18: L wnl all motions R: flex 121d ; abd 75d ; IR L1 ; ER CTJ    Time  8    Period  Weeks    Status  On-going      PT LONG TERM GOAL #3   Title  Patient will demonstrate gross shoulder strength 4+/5 or better to return to PLOF, and to be able to safely complete     Baseline  05/22/18 R ER 5/5 ; IR 4+/5 flex 4+/5 ; abd 4+/5    Time  8    Period  Weeks    Status  Partially Met      PT LONG TERM GOAL #4   Title  Pt will decrease worst pain as reported on NPRS by at least 3 points in order to demonstrate clinically significant reduction in pain.    Baseline  05/22/18 2/10    Time  8    Period  Weeks    Status  Achieved            Plan - 06/27/18 1631    Clinical Impression Statement  PT continued pain management techniques which patient reporting decreased pain following. Patient is making small ROM improvement as par-for-the-course for condiiton, but is remaining encouraged by small improvements.     Rehab Potential  Good    Clinical Impairments  Affecting Rehab Potential  (+) fairly yong age, motivation (-) Sedentary lifestyle, chronicity of  pain, hypersensitivity, psycho-social factors    PT Frequency  2x / week    PT Duration  8 weeks    PT Treatment/Interventions  Moist Heat;Traction;Ultrasound;Cryotherapy;Fluidtherapy;Electrical Stimulation;Therapeutic exercise;Taping;Patient/family education;Therapeutic activities;Functional mobility training;Neuromuscular re-education;Passive range of motion;Dry needling;Manual techniques    PT Next Visit Plan   manual techniques for soft tissue restriction/mobility, postural re-training, periscapular strengthening    PT Home Exercise Plan  Supine pec stretch, supine IR stretch, pulleys AAROM flex/abd, scapular retractions    Consulted and Agree with Plan of Care  Patient       Patient will benefit from skilled therapeutic intervention in order to improve the following deficits and impairments:  Improper body mechanics, Pain, Postural dysfunction, Impaired tone, Decreased activity tolerance, Decreased endurance, Decreased range of motion, Decreased strength, Impaired UE functional use  Visit Diagnosis: Chronic right shoulder pain     Problem List Patient Active Problem List   Diagnosis Date Noted  . Frozen shoulder syndrome 06/13/2018  . Encounter for preventative adult health care exam with abnormal findings 05/27/2018  . Raynaud phenomenon 05/28/2017  . Generalized anxiety disorder 05/29/2016  . S/P hysterectomy 05/29/2016  . Headache 04/14/2016  . History of mammogram 03/02/2011  . Migraine syndrome   . Hx of colonic polyps   . GERD (gastroesophageal reflux disease) 12/16/2010   Shelton Silvas PT, DPT Shelton Silvas 06/27/2018, 5:13 PM  [facility] [address], [place], [address] Phone: [phone]   Fax:  [phone]  Name: Stephanie Henderson MRN: [id number] Date of Birth: Jul 12, 1965

## 2018-07-02 ENCOUNTER — Encounter: Payer: Self-pay | Admitting: Family Medicine

## 2018-07-03 MED ORDER — DICLOFENAC SODIUM 1 % TD GEL: 2.0000 g | Freq: Four times a day (QID) | TRANSDERMAL | 11 refills | Status: DC

## 2018-07-04 ENCOUNTER — Ambulatory Visit: Payer: 59 | Admitting: Physical Therapy

## 2018-07-04 ENCOUNTER — Encounter: Payer: Self-pay | Admitting: Physical Therapy

## 2018-07-04 DIAGNOSIS — M25511 Pain in right shoulder: Principal | ICD-10-CM

## 2018-07-04 DIAGNOSIS — G8929 Other chronic pain: Secondary | ICD-10-CM

## 2018-07-04 NOTE — Therapy (Signed)
[facility] [address] [place], [address] Phone: [phone]   Fax:  [phone]  Physical Therapy Treatment  Patient Details  Name: Stephanie Henderson MRN: [id number] Date of Birth: 05-Feb-1966 Referring Provider (PT): Derrel Nip MD   Encounter Date: 07/04/2018  PT End of Session - 07/04/18 1623    Visit Number  20    Number of Visits  33    Date for PT Re-Evaluation  07/12/18    PT Start Time  0400    PT Stop Time  0445    PT Time Calculation (min)  45 min    Activity Tolerance  Patient tolerated treatment well    Behavior During Therapy  [facility] for tasks assessed/performed       Past Medical History:  Diagnosis Date  . Fibroid   . GERD (gastroesophageal reflux disease) Aug 2012   with esophagitis by EGD  . Hematuria   . Hx of colonic polyps August 2012   repeat due 2016,  5 polyps 2009, clear 2012 Gustavo Lah)  . Menorrhagia    did not tolerate trial of ocps due to migraines  . Migraine headache   . Migraine syndrome    since age 18,  sporadic    Past Surgical History:  Procedure Laterality Date  . BREAST BIOPSY Right 2017   benign  . CYSTOSCOPY    . laparoscopy    . VAGINAL HYSTERECTOMY  6/13   with morcellation    There were no vitals filed for this visit.  Subjective Assessment - 07/04/18 1602    Subjective  Patient reports 3/10 pain this am reporting some increased pain at R UT today following CPR course yesterday.     Pertinent History  Patient is a 53 year old female with R shoulder pain that began August of this year when she took something out of her pocket and had intense lateral shoulder pain with "tingling". Patient reports pain was intermittent through Sept, but in Oct became pretty constant. Patient reports pain is sharp at the lateral shoulder, with a dull ache in ant and post shoulder, and that she is unable to sleep on that side. Patient reports most pain with "bringing her arm out to  the side, with pain with behind the back motions as well. Patient reports shearing pain at lateral shoulder, with dull ache in post and ant shoulder- occasssional hand "tingling" and forearm ache. Patient reported worst pain over the past week 8/10; best 3/10. Pt denies N/V, B&B, unexplained weight fluctuation, saddle paresthesia, fever, night sweats, or unrelenting night pain at this time.    Limitations  Lifting;House hold activities    How long can you sit comfortably?  unlimited    How long can you stand comfortably?  unlimited    How long can you walk comfortably?  unlimited    Diagnostic tests  none at this time    Pain Onset  More than a month ago        Ther-Ex - Pulleys x20 flexion; x20 abd with min cuing for 3sec hold - Isometric shoulder ER and IR 3x 30sec hold in each position with education on purpose of isometrics and good carry over following demo and min cuing for proper form  - AROM 3x 10 abd and flex within painfree ROM (apprx 90d for flex, 60 for abd)  Manual - STM withtrigger point releaseto, R UT, R bicep, and R teres minor/latissimusandRmedialdeltoid. (3)70m .30needles placed along the  R UT, R bicep and R medial deltoidwith pincer grasptechniqueto decrease increased muscular spasms and trigger points with the patient positioned inprone and supine.Good local twitch response noted.Patient was educated on risks and benefits of therapy and verbally consents to PT. - Multiple bouts of PROM into flex/abd/IR/ER (rotation at80d of abd) with10sec holds in max range of patient tolerance; increased difficulty with ER - GHJ PA grade III mobs 30sec bouts 8 bouts in 80d of abd  ESTIM+ heat packHiVolt ESTIM63mn at patient tolerated105Vincreased to115V through treatmentatR UT and middle deltoidarea. Attempted d/t success of treatment at previous session success. With PT assessing patient tolerance throughout (increasing intensity as needed), monitoring  skin integrity (normal), with decreased pain noted from patientfollowing and slight increase in IR and ER    Special Tests (-) Speeds (+) Yergason (-) Bicep Load (+) Crank (+)Obrien (+) Apprehension                       PT Education - 07/04/18 1623    Education Details  Exercise form    Person(s) Educated  Patient    Methods  Explanation;Demonstration    Comprehension  Verbalized understanding;Returned demonstration       PT Short Term Goals - 03/22/18 2136      PT SHORT TERM GOAL #1   Title  Pt will be independent with HEP in order to improve strength and balance in order to decrease fall risk and improve function at home and work    Time  4    Period  Weeks    Status  New        PT Long Term Goals - 05/22/18 1658      PT LONG TERM GOAL #1   Title  Patient will increase FOTO score to 71 to demonstrate predicted increase in functional mobility to complete ADLs    Baseline  05/22/18 51    Time  8    Period  Weeks    Status  On-going      PT LONG TERM GOAL #2   Title  Patient will demonstrate symmetrical, full gross shoulder AROM in order to return to PLOF and complete ADLs    Baseline  05/22/18: L wnl all motions R: flex 121d ; abd 75d ; IR L1 ; ER CTJ    Time  8    Period  Weeks    Status  On-going      PT LONG TERM GOAL #3   Title  Patient will demonstrate gross shoulder strength 4+/5 or better to return to PLOF, and to be able to safely complete     Baseline  05/22/18 R ER 5/5 ; IR 4+/5 flex 4+/5 ; abd 4+/5    Time  8    Period  Weeks    Status  Partially Met      PT LONG TERM GOAL #4   Title  Pt will decrease worst pain as reported on NPRS by at least 3 points in order to demonstrate clinically significant reduction in pain.    Baseline  05/22/18 2/10    Time  8    Period  Weeks    Status  Achieved            Plan - 07/04/18 1721    Clinical Impression Statement  Patient continues to report no pain following session. PT assessed  possible labrum dysfunction this session with a few positive orthopedic tests (see therapy note) Unable to report if positive  test are indicative of labral dysfunction or diffuse pain and ROM difficulties secondary to adhesive capsulitis.. Patient continues to have slight increase in ROM following session, but biggest session gains are pain management. Pt returns to ortho MD next Thursday with hopeful better assessment of symptoms and possible imaging following. PT will continue pain management and strengthening progression as able.     Rehab Potential  Good    Clinical Impairments Affecting Rehab Potential  (+) fairly yong age, motivation (-) Sedentary lifestyle, chronicity of pain, hypersensitivity, psycho-social factors    PT Frequency  2x / week    PT Duration  8 weeks    PT Treatment/Interventions  Moist Heat;Traction;Ultrasound;Cryotherapy;Fluidtherapy;Electrical Stimulation;Therapeutic exercise;Taping;Patient/family education;Therapeutic activities;Functional mobility training;Neuromuscular re-education;Passive range of motion;Dry needling;Manual techniques    PT Next Visit Plan   manual techniques for soft tissue restriction/mobility, postural re-training, periscapular strengthening    PT Home Exercise Plan  Supine pec stretch, supine IR stretch, pulleys AAROM flex/abd, scapular retractions    Consulted and Agree with Plan of Care  Patient       Patient will benefit from skilled therapeutic intervention in order to improve the following deficits and impairments:  Improper body mechanics, Pain, Postural dysfunction, Impaired tone, Decreased activity tolerance, Decreased endurance, Decreased range of motion, Decreased strength, Impaired UE functional use  Visit Diagnosis: Chronic right shoulder pain     Problem List Patient Active Problem List   Diagnosis Date Noted  . Frozen shoulder syndrome 06/13/2018  . Encounter for preventative adult health care exam with abnormal findings  05/27/2018  . Raynaud phenomenon 05/28/2017  . Generalized anxiety disorder 05/29/2016  . S/P hysterectomy 05/29/2016  . Headache 04/14/2016  . History of mammogram 03/02/2011  . Migraine syndrome   . Hx of colonic polyps   . GERD (gastroesophageal reflux disease) 12/16/2010   Shelton Silvas PT, DPT Shelton Silvas 07/04/2018, 5:24 PM  [facility] [address], [place], [address] Phone: [phone]   Fax:  [phone]  Name: JAILEN LUNG MRN: [id number] Date of Birth: 06-Nov-1965

## 2018-07-11 ENCOUNTER — Encounter: Payer: Self-pay | Admitting: Physical Therapy

## 2018-07-11 ENCOUNTER — Ambulatory Visit: Payer: 59 | Admitting: Physical Therapy

## 2018-07-11 DIAGNOSIS — G8929 Other chronic pain: Secondary | ICD-10-CM | POA: Diagnosis not present

## 2018-07-11 DIAGNOSIS — M25511 Pain in right shoulder: Secondary | ICD-10-CM | POA: Diagnosis not present

## 2018-07-11 NOTE — Therapy (Signed)
[facility] [address], [place], [address] Phone: [phone]   Fax:  [phone]  Physical Therapy Treatment  Patient Details  Name: Stephanie Henderson MRN: [id number] Date of Birth: 1965/09/19 Referring Provider (PT): Derrel Nip MD   Encounter Date: 07/11/2018  PT End of Session - 07/11/18 1641    Visit Number  21    Number of Visits  33    Date for PT Re-Evaluation  07/12/18    PT Start Time  0400    PT Stop Time  0445    PT Time Calculation (min)  45 min    Activity Tolerance  Patient tolerated treatment well    Behavior During Therapy  [facility] for tasks assessed/performed       Past Medical History:  Diagnosis Date  . Fibroid   . GERD (gastroesophageal reflux disease) Aug 2012   with esophagitis by EGD  . Hematuria   . Hx of colonic polyps August 2012   repeat due 2016,  5 polyps 2009, clear 2012 Gustavo Lah)  . Menorrhagia    did not tolerate trial of ocps due to migraines  . Migraine headache   . Migraine syndrome    since age 53,  sporadic    Past Surgical History:  Procedure Laterality Date  . BREAST BIOPSY Right 2017   benign  . CYSTOSCOPY    . laparoscopy    . VAGINAL HYSTERECTOMY  6/13   with morcellation    There were no vitals filed for this visit.  Subjective Assessment - 07/11/18 1601    Subjective  Patient minimal pain since last PT session; 1/10 today. Continues to report difficulty with motion, that is able the same.     Pertinent History  Patient is a 53 year old female with R shoulder pain that began August of this year when she took something out of her pocket and had intense lateral shoulder pain with "tingling". Patient reports pain was intermittent through Sept, but in Oct became pretty constant. Patient reports pain is sharp at the lateral shoulder, with a dull ache in ant and post shoulder, and that she is unable to sleep on that side. Patient reports most pain with "bringing  her arm out to the side, with pain with behind the back motions as well. Patient reports shearing pain at lateral shoulder, with dull ache in post and ant shoulder- occasssional hand "tingling" and forearm ache. Patient reported worst pain over the past week 8/10; best 3/10. Pt denies N/V, B&B, unexplained weight fluctuation, saddle paresthesia, fever, night sweats, or unrelenting night pain at this time.    Limitations  Lifting;House hold activities    How long can you sit comfortably?  unlimited    How long can you stand comfortably?  unlimited    How long can you walk comfortably?  unlimited    Diagnostic tests  none at this time    Pain Onset  More than a month ago         Ther-Ex - Pulleys x20 flexion; x20 abd with min cuing for 3sec hold - AAROM ER with cane x20 in sitting and x20 in supine with cuing for proper form with 3 sec hold; advised patient to add to HEP in place of gravity dependent ER AAROM - AAROM IR with cane x20 with 3sec hold with cuing initially for proper form with good carry over following - Postural cuing in sitting with good carry over following  Manual - STM withtrigger point releasetoR bicep, R teres minor/latissimusandRmedialdeltoid. - Multiple bouts of PROM into flex/abd/IR/ER (rotation at80d> 90d of abd) with10sec holds in max range of patient tolerance; increased difficulty with ER- increased to 20d  - GHJ PA grade III mobs 30sec bouts 8 bouts in 80d of abd  ESTIM+ heat packHiVolt ESTIM41mn at patient tolerated105Vincreased to115V through treatmentatR bicep and middle deltoidarea. Attempted d/t success of treatment at previous session success. With PT assessing patient tolerance throughout (increasing intensity as needed), monitoring skin integrity (normal), with decreased pain noted from patientfollowing and slight increase in IR and ER approx 20d                       PT Education - 07/11/18 1641    Education  Details  Exercise form    Person(s) Educated  Patient    Methods  Explanation;Demonstration;Tactile cues;Verbal cues    Comprehension  Verbalized understanding;Returned demonstration;Verbal cues required;Tactile cues required       PT Short Term Goals - 03/22/18 2136      PT SHORT TERM GOAL #1   Title  Pt will be independent with HEP in order to improve strength and balance in order to decrease fall risk and improve function at home and work    Time  4    Period  Weeks    Status  New        PT Long Term Goals - 05/22/18 1658      PT LONG TERM GOAL #1   Title  Patient will increase FOTO score to 71 to demonstrate predicted increase in functional mobility to complete ADLs    Baseline  05/22/18 51    Time  8    Period  Weeks    Status  On-going      PT LONG TERM GOAL #2   Title  Patient will demonstrate symmetrical, full gross shoulder AROM in order to return to PLOF and complete ADLs    Baseline  05/22/18: L wnl all motions R: flex 121d ; abd 75d ; IR L1 ; ER CTJ    Time  8    Period  Weeks    Status  On-going      PT LONG TERM GOAL #3   Title  Patient will demonstrate gross shoulder strength 4+/5 or better to return to PLOF, and to be able to safely complete     Baseline  05/22/18 R ER 5/5 ; IR 4+/5 flex 4+/5 ; abd 4+/5    Time  8    Period  Weeks    Status  Partially Met      PT LONG TERM GOAL #4   Title  Pt will decrease worst pain as reported on NPRS by at least 3 points in order to demonstrate clinically significant reduction in pain.    Baseline  05/22/18 2/10    Time  8    Period  Weeks    Status  Achieved            Plan - 07/11/18 1717    Clinical Impression Statement  PT continued therex progression for increased shoulder ROM with 30d improvement in shoulder ER and IR. Patient continues to report pain reduction at the conclusion of session, with no pain leaving clinic. PT will follow up following ortho MD followup with patient this Thursday, and adjust POC as  needed.     Rehab Potential  Good    Clinical Impairments Affecting Rehab  Potential  (+) fairly yong age, motivation (-) Sedentary lifestyle, chronicity of pain, hypersensitivity, psycho-social factors    PT Frequency  2x / week    PT Duration  8 weeks    PT Treatment/Interventions  Moist Heat;Traction;Ultrasound;Cryotherapy;Fluidtherapy;Electrical Stimulation;Therapeutic exercise;Taping;Patient/family education;Therapeutic activities;Functional mobility training;Neuromuscular re-education;Passive range of motion;Dry needling;Manual techniques    PT Next Visit Plan   manual techniques for soft tissue restriction/mobility, postural re-training, periscapular strengthening    PT Home Exercise Plan  Supine pec stretch, supine IR stretch, pulleys AAROM flex/abd, scapular retractions    Consulted and Agree with Plan of Care  Patient       Patient will benefit from skilled therapeutic intervention in order to improve the following deficits and impairments:  Improper body mechanics, Pain, Postural dysfunction, Impaired tone, Decreased activity tolerance, Decreased endurance, Decreased range of motion, Decreased strength, Impaired UE functional use  Visit Diagnosis: Chronic right shoulder pain     Problem List Patient Active Problem List   Diagnosis Date Noted  . Frozen shoulder syndrome 06/13/2018  . Encounter for preventative adult health care exam with abnormal findings 05/27/2018  . Raynaud phenomenon 05/28/2017  . Generalized anxiety disorder 05/29/2016  . S/P hysterectomy 05/29/2016  . Headache 04/14/2016  . History of mammogram 03/02/2011  . Migraine syndrome   . Hx of colonic polyps   . GERD (gastroesophageal reflux disease) 12/16/2010   Shelton Silvas PT, DPT Shelton Silvas 07/11/2018, 5:44 PM  [facility] [address] [place], [address] Phone: [phone]   Fax:  [phone]  Name: OLUWASEMILORE PASCUZZI MRN: [id number] Date of Birth: February 21, 1966

## 2018-07-13 ENCOUNTER — Ambulatory Visit: Payer: Self-pay

## 2018-07-13 ENCOUNTER — Ambulatory Visit (INDEPENDENT_AMBULATORY_CARE_PROVIDER_SITE_OTHER): Payer: 59 | Admitting: Family Medicine

## 2018-07-13 ENCOUNTER — Encounter: Payer: Self-pay | Admitting: Family Medicine

## 2018-07-13 VITALS — BP 110/60 | HR 115 | Ht 62.0 in | Wt 111.0 lb

## 2018-07-13 DIAGNOSIS — G8929 Other chronic pain: Secondary | ICD-10-CM

## 2018-07-13 DIAGNOSIS — M25511 Pain in right shoulder: Principal | ICD-10-CM

## 2018-07-13 MED ORDER — DIAZEPAM 5 MG PO TABS: ORAL_TABLET | ORAL | 0 refills | Status: DC

## 2018-07-13 NOTE — Progress Notes (Signed)
[facility] [address] [place], [place] [address] Phone: [phone] Subjective:     CC: Shoulder pain follow-up  PJK:[id number]     07/13/2018: Stephanie Henderson is a 53 y.o. female coming in with complaint of right shoulder pain. Has been doing physical therapy and home therapy. Has not felt like her range of motion has been improving. Wants to discuss her [place] results form last visit.  Patient continues to have discomfort on.  Patient feels that the pain is improved to a certain degree but unfortunately still the range of motion is lacking.  Was seen and will appear to have frozen shoulder with calcific changes.    Previous x-rays of the right shoulder were independently visualized by me showing no significant bony abnormality Past Medical History:  Diagnosis Date  . Fibroid   . GERD (gastroesophageal reflux disease) Aug 2012   with esophagitis by EGD  . Hematuria   . Hx of colonic polyps August 2012   repeat due 2016,  5 polyps 2009, clear 2012 Gustavo Lah)  . Menorrhagia    did not tolerate trial of ocps due to migraines  . Migraine headache   . Migraine syndrome    since age 37,  sporadic   Past Surgical History:  Procedure Laterality Date  . BREAST BIOPSY Right 2017   benign  . CYSTOSCOPY    . laparoscopy    . VAGINAL HYSTERECTOMY  6/13   with morcellation   Social History   Socioeconomic History  . Marital status: Married    Spouse name: Not on file  . Number of children: 2  . Years of education: Not on file  . Highest education level: Not on file  Occupational History  . Occupation: Therapist, sports - [employer]  Social Needs  . Financial resource strain: Not on file  . Food insecurity:    Worry: Not on file    Inability: Not on file  . Transportation needs:    Medical: Not on file    Non-medical: Not on file  Tobacco Use  . Smoking status: Never Smoker  . Smokeless tobacco: Never Used  Substance and Sexual Activity  .  Alcohol use: Yes    Alcohol/week: 4.0 - 5.0 standard drinks    Types: 4 - 5 Standard drinks or equivalent per week    Comment: OCCAS  . Drug use: No  . Sexual activity: Yes    Birth control/protection: Surgical  Lifestyle  . Physical activity:    Days per week: 2 days    Minutes per session: 30 min  . Stress: Not on file  Relationships  . Social connections:    Talks on phone: Not on file    Gets together: Not on file    Attends religious service: Not on file    Active member of club or organization: Not on file    Attends meetings of clubs or organizations: Not on file    Relationship status: Not on file  Other Topics Concern  . Not on file  Social History Narrative  . Not on file   Allergies  Allergen Reactions  . Propoxyphene     Other reaction(s): Other (See Comments) Unknown  . Tetracyclines & Related Diarrhea and Nausea Only   Family History  Problem Relation Age of Onset  . Hyperlipidemia Mother   . Mental illness Mother        depression  . Colon cancer Father   . Depression  Father   . Cancer Father 48       mets to lung   . Mental illness Father   . Kidney disease Daughter   . Diabetes Neg Hx   . Heart disease Neg Hx   . Breast cancer Neg Hx   . Ovarian cancer Neg Hx       Current Outpatient Medications (Respiratory):  .  fluticasone (FLONASE) 50 MCG/ACT nasal spray, Place 1 spray into both nostrils 2 (two) times daily for 10 days.  Current Outpatient Medications (Analgesics):  Marland Kitchen  SUMAtriptan (IMITREX) 100 MG tablet, Take 1 tablet (100 mg total) by mouth once for 1 dose. May repeat in 2 hours if needed   Current Outpatient Medications (Other):  Marland Kitchen  ALPRAZolam (XANAX) 0.5 MG tablet, Take 1 tablet (0.5 mg total) by mouth at bedtime as needed for anxiety. .  cyclobenzaprine (FLEXERIL) 10 MG tablet, Take 1 tablet (10 mg total) by mouth at bedtime. .  diclofenac sodium (VOLTAREN) 1 % GEL, Apply 2 g topically 4 (four) times daily. To affected joint. .   famotidine (PEPCID) 40 MG tablet, Take 1 tablet (40 mg total) by mouth daily. Marland Kitchen  levofloxacin (LEVAQUIN) 500 MG tablet, Take 1 tablet (500 mg total) by mouth daily. .  pantoprazole (PROTONIX) 40 MG tablet, Take 1 tablet (40 mg total) by mouth daily. .  sucralfate (CARAFATE) 1 G tablet, Take 1 g by mouth 3 (three) times daily as needed.   .  Vitamin D, Ergocalciferol, (DRISDOL) 1.25 MG (50000 UT) CAPS capsule, Take 1 capsule (50,000 Units total) by mouth every 7 (seven) days. .  diazepam (VALIUM) 5 MG tablet, One tab by mouth, 2 hours before procedure.    Past medical history, social, surgical and family history all reviewed in electronic medical record.  No pertanent information unless stated regarding to the chief complaint.   Review of Systems:  No headache, visual changes, nausea, vomiting, diarrhea, constipation, dizziness, abdominal pain, skin rash, fevers, chills, night sweats, weight loss, swollen lymph nodes, body aches, joint swelling, muscle aches, chest pain, shortness of breath, mood changes.   Objective  Blood pressure 110/60, pulse (!) 115, height 5\' 2"  (1.575 m), weight 111 lb (50.3 kg), last menstrual period 02/20/2011, SpO2 97 %.    General: No apparent distress alert and oriented x3 mood and affect normal, dressed appropriately.  HEENT: Pupils equal, extraocular movements intact  Respiratory: Patient's speak in full sentences and does not appear short of breath  Cardiovascular: No lower extremity edema, non tender, no erythema  Skin: Warm dry intact with no signs of infection or rash on extremities or on axial skeleton.  Abdomen: Soft nontender  Neuro: Cranial nerves II through XII are intact, neurovascularly intact in all extremities with 2+ DTRs and 2+ pulses.  Lymph: No lymphadenopathy of posterior or anterior cervical chain or axillae bilaterally.  Gait normal with good balance and coordination.  MSK:  Non tender with full range of motion and good stability and  symmetric strength and tone of  elbows, wrist, hip, knee and ankles bilaterally.  Right shoulder exam does have some very mild atrophy compared to the contralateral side.  Patient's range of motion still significantly limited.  Passive range of motion only has 95 degrees, minimal external range of motion.  Internal range of motion to the lateral hip.  Rotator cuff strength is actually worsened 3+ out of 5 strength compared to contralateral side.  Positive O'Brien's to lateral shoulder unremarkable  MSK US performed of:  Right shoulder This study was ordered, performed, and interpreted by Charlann Boxer D.O.  Shoulder:   Supraspinatus: Significant decrease in calcific changes that was seen previously.  Patient now does have what appears to be intersubstance tearing of the supraspinatus but no true full-thickness tear. . Teres Minor:  Appears normal on long and transverse views. AC joint:  Capsule undistended, no geyser sign. Glenohumeral Joint:  Appears normal without effusion. Glenoid Labrum: The labrum significantly thickened.  Cannot tell if it is part of the capsule or other abnormality. Biceps Tendon:  Appears normal on long and transverse views, no fraying of tendon, tendon located in intertubercular groove, no subluxation with shoulder internal or external rotation. No increased power doppler signal. Pression: Possible rotator cuff tear   Impression and Recommendations:      The above documentation has been reviewed and is accurate and complete Lyndal Pulley, DO       Note: This dictation was prepared with Dragon dictation along with smaller phrase technology. Any transcriptional errors that result from this process are unintentional.

## 2018-07-13 NOTE — Patient Instructions (Addendum)
Good to see you  Ice is your friend We will get MR- arthrogram  Valium before  Will write you after and discuss

## 2018-07-13 NOTE — Assessment & Plan Note (Signed)
Patient does have positive O'Brien as well as weakness.  There is on ultrasound possible intrasubstance tearing of the supraspinatus.  Also significant abnormality of the posterior labrum.  Patient is failed all conservative therapy with formal physical therapy and injections.  Not believe at this time that advanced imaging with an MR arthrogram is necessary.  Patient would be open to surgical intervention if necessary.

## 2018-07-18 ENCOUNTER — Ambulatory Visit: Payer: 59 | Attending: Internal Medicine

## 2018-07-18 DIAGNOSIS — R293 Abnormal posture: Secondary | ICD-10-CM | POA: Insufficient documentation

## 2018-07-18 DIAGNOSIS — G8929 Other chronic pain: Secondary | ICD-10-CM | POA: Diagnosis not present

## 2018-07-18 DIAGNOSIS — M25511 Pain in right shoulder: Secondary | ICD-10-CM | POA: Diagnosis not present

## 2018-07-19 NOTE — Therapy (Signed)
[facility] [address], [place], [address] Phone: [phone]   Fax:  [phone]  Physical Therapy Treatment  Patient Details  Name: Stephanie Henderson MRN: [id number] Date of Birth: Jun 27, 1965 Referring Provider (PT): Derrel Nip MD   Encounter Date: 07/18/2018  PT End of Session - 07/18/18 1606    Visit Number  22    Number of Visits  33    Date for PT Re-Evaluation  07/12/18    PT Start Time  8841    PT Stop Time  1645    PT Time Calculation (min)  40 min    Activity Tolerance  Patient tolerated treatment well    Behavior During Therapy  [facility] for tasks assessed/performed       Past Medical History:  Diagnosis Date  . Fibroid   . GERD (gastroesophageal reflux disease) Aug 2012   with esophagitis by EGD  . Hematuria   . Hx of colonic polyps August 2012   repeat due 2016,  5 polyps 2009, clear 2012 Gustavo Lah)  . Menorrhagia    did not tolerate trial of ocps due to migraines  . Migraine headache   . Migraine syndrome    since age 58,  sporadic    Past Surgical History:  Procedure Laterality Date  . BREAST BIOPSY Right 2017   benign  . CYSTOSCOPY    . laparoscopy    . VAGINAL HYSTERECTOMY  6/13   with morcellation    There were no vitals filed for this visit.  Subjective Assessment - 07/18/18 1604    Subjective  Pt doing well in general, saw Dr. Tamala Julian last Thursday, and she will be getting MRI next wed at 2:00 at [facility]. Pain remains unchanged.    Pertinent History  Patient is a 53 year old female with R shoulder pain that began August of this year when she took something out of her pocket and had intense lateral shoulder pain with "tingling". Patient reports pain was intermittent through Sept, but in Oct became pretty constant. Patient reports pain is sharp at the lateral shoulder, with a dull ache in ant and post shoulder, and that she is unable to sleep on that side. Patient reports most pain with  "bringing her arm out to the side, with pain with behind the back motions as well. Patient reports shearing pain at lateral shoulder, with dull ache in post and ant shoulder- occasssional hand "tingling" and forearm ache. Patient reported worst pain over the past week 8/10; best 3/10. Pt denies N/V, B&B, unexplained weight fluctuation, saddle paresthesia, fever, night sweats, or unrelenting night pain at this time.       Ther-Ex -Pulleys x20 flexion; x20 abd with min cuing for 3sec hold -AAROM ER with cane x20 in sitting and x20 in supine with cuing for proper form with 3 sec hold; -AAROM IR with cane x20 with 3sec hold with cuing initially for proper form with good carry over following  AROM Shoulder flex Right; 114 degrees (116* at eval ) Shoulder ext 36 degrees Shoulder IR: 56 degrees Shoulder abdct: 70 degrees (96 degrees at eval)  Shoulder IR: T9 level  Shoulder ER:  T1 Level  PROM Shoulder flex: 126 degrees Shoulder ABDCT: 67 degrees (abduction 91 degrees)  Shoulder ER: 48 degrees  Shoulder IR: 36 degrees  Strength Right external rotation: 4-/5 painful give way Right Internal rotation: 4-/5 painful give way  Elbow Flexon 5/5 Elbow Extension 4+/5 Shoulder Flexion 4-/5  painful give way Shoulder ABDCT: 4-/5 give way to pain      PT Short Term Goals - 07/18/18 1631      PT SHORT TERM GOAL #1   Title  --        PT Long Term Goals - 07/18/18 1631      PT LONG TERM GOAL #1   Title  Patient will increase FOTO score to 71 to demonstrate predicted increase in functional mobility to complete ADLs    Baseline  05/22/18   51    Time  8    Period  Weeks    Status  On-going      PT LONG TERM GOAL #2   Title  Patient will demonstrate symmetrical, full gross shoulder AROM in order to return to PLOF and complete ADLs    Baseline  05/22/18: L wnl all motions R: flex 121d ; abd 75d ; IR L1 ; ER CTJ    Time  8    Period  Weeks    Status  On-going      PT LONG TERM GOAL #3    Title  Patient will demonstrate gross shoulder strength 4+/5 or better to return to PLOF, and to be able to safely complete     Baseline  05/22/18 R ER 5/5 ; IR 4+/5 flex 4+/5 ; abd 4+/5    Time  8    Period  Weeks    Status  Partially Met      PT LONG TERM GOAL #4   Title  Pt will decrease worst pain as reported on NPRS by at least 3 points in order to demonstrate clinically significant reduction in pain.    Baseline  05/22/18 2/10    Time  8    Period  Weeks    Status  Achieved            Plan - 07/18/18 1430    Clinical Impression Statement  Reassesment performed this date. Pt is noted to have minimal progress with ROM improvement. Pt reports improved pain control and self efficacy with symptoms management. Pt is now pending MRI. Education of utility or more aggressive pursuit of strength interventions gong forward to maintain tissue integrity, and improve function with less pain within the available range. Pt agreeabl that PT has made a big difference in her quality of life, abiliy to sleep, and tolerate activity both in home and at work.     Stability/Clinical Decision Making  Evolving/Moderate complexity    Clinical Decision Making  Moderate    Rehab Potential  Good    Clinical Impairments Affecting Rehab Potential  (+) fairly yong age, motivation (-) Sedentary lifestyle, chronicity of pain, hypersensitivity, psycho-social factors    PT Frequency  2x / week    PT Duration  8 weeks    PT Treatment/Interventions  Moist Heat;Traction;Ultrasound;Cryotherapy;Fluidtherapy;Electrical Stimulation;Therapeutic exercise;Taping;Patient/family education;Therapeutic activities;Functional mobility training;Neuromuscular re-education;Passive range of motion;Dry needling;Manual techniques    PT Next Visit Plan  If pt agreeable shift toward strengthening in available range.    PT Home Exercise Plan  Supine pec stretch, supine IR stretch, pulleys AAROM flex/abd, scapular retractions    Consulted and  Agree with Plan of Care  Patient       Patient will benefit from skilled therapeutic intervention in order to improve the following deficits and impairments:  Improper body mechanics, Pain, Postural dysfunction, Impaired tone, Decreased activity tolerance, Decreased endurance, Decreased range of motion, Decreased strength, Impaired UE functional use  Visit  Diagnosis: Chronic right shoulder pain  Acute pain of right shoulder  Abnormal posture     Problem List Patient Active Problem List   Diagnosis Date Noted  . Right shoulder pain 07/13/2018  . Frozen shoulder syndrome 06/13/2018  . Encounter for preventative adult health care exam with abnormal findings 05/27/2018  . Raynaud phenomenon 05/28/2017  . Generalized anxiety disorder 05/29/2016  . S/P hysterectomy 05/29/2016  . Headache 04/14/2016  . History of mammogram 03/02/2011  . Migraine syndrome   . Hx of colonic polyps   . GERD (gastroesophageal reflux disease) 12/16/2010   2:35 PM, 07/19/18 Etta Grandchild, PT, DPT Physical Therapist - [facility] [phone] (Office)    Buccola,Allan C 07/19/2018, 2:35 PM  [facility] [address] [place], [address] Phone: [phone]   Fax:  [phone]  Name: Stephanie Henderson MRN: [id number] Date of Birth: [birth year]

## 2018-07-23 ENCOUNTER — Ambulatory Visit: Payer: Self-pay

## 2018-07-26 ENCOUNTER — Ambulatory Visit
Admission: RE | Admit: 2018-07-26 | Discharge: 2018-07-26 | Disposition: A | Discharge status: Home / Self Care | Payer: 59 | Source: Ambulatory Visit | Attending: Family Medicine | Admitting: Family Medicine

## 2018-07-26 ENCOUNTER — Other Ambulatory Visit: Payer: Self-pay

## 2018-07-26 DIAGNOSIS — G8929 Other chronic pain: Secondary | ICD-10-CM | POA: Insufficient documentation

## 2018-07-26 DIAGNOSIS — M25511 Pain in right shoulder: Principal | ICD-10-CM

## 2018-07-26 MED ORDER — GADOBUTROL 1 MMOL/ML IV SOLN
1.0000 mL | Freq: Once | INTRAVENOUS | Status: AC | PRN
  Administered 2018-07-26: 1 mL

## 2018-07-26 MED ORDER — SODIUM CHLORIDE (PF) 0.9 % IJ SOLN
10.0000 mL | INTRAMUSCULAR | Status: DC | PRN
  Administered 2018-07-26: 10 mL via INTRAVENOUS
  Filled 2018-07-26: qty 10

## 2018-07-26 MED ORDER — LIDOCAINE HCL (PF) 1 % IJ SOLN
5.0000 mL | Freq: Once | INTRAMUSCULAR | Status: AC
  Administered 2018-07-26: 5 mL via INTRADERMAL
  Filled 2018-07-26: qty 5

## 2018-07-26 MED ORDER — IOPAMIDOL (ISOVUE-200) INJECTION 41%
10.0000 mL | Freq: Once | INTRAVENOUS | Status: AC | PRN
  Administered 2018-07-26: 10 mL via INTRAVENOUS
  Filled 2018-07-26: qty 50

## 2018-07-31 ENCOUNTER — Encounter
Admission: RE | Disposition: A | Discharge status: Home / Self Care | Payer: Self-pay | Source: Home / Self Care | Attending: Gastroenterology

## 2018-07-31 ENCOUNTER — Ambulatory Visit: Payer: 59 | Admitting: Anesthesiology

## 2018-07-31 ENCOUNTER — Ambulatory Visit
Admission: RE | Admit: 2018-07-31 | Discharge: 2018-07-31 | Disposition: A | Discharge status: Home / Self Care | Payer: 59 | Attending: Gastroenterology | Admitting: Gastroenterology

## 2018-07-31 ENCOUNTER — Other Ambulatory Visit: Payer: Self-pay

## 2018-07-31 ENCOUNTER — Encounter: Payer: Self-pay | Admitting: Gastroenterology

## 2018-07-31 DIAGNOSIS — K219 Gastro-esophageal reflux disease without esophagitis: Secondary | ICD-10-CM | POA: Diagnosis not present

## 2018-07-31 DIAGNOSIS — Z7951 Long term (current) use of inhaled steroids: Secondary | ICD-10-CM | POA: Insufficient documentation

## 2018-07-31 DIAGNOSIS — D131 Benign neoplasm of stomach: Secondary | ICD-10-CM | POA: Diagnosis not present

## 2018-07-31 DIAGNOSIS — Q438 Other specified congenital malformations of intestine: Secondary | ICD-10-CM | POA: Insufficient documentation

## 2018-07-31 DIAGNOSIS — Z1211 Encounter for screening for malignant neoplasm of colon: Secondary | ICD-10-CM | POA: Diagnosis not present

## 2018-07-31 DIAGNOSIS — D123 Benign neoplasm of transverse colon: Secondary | ICD-10-CM | POA: Insufficient documentation

## 2018-07-31 DIAGNOSIS — K317 Polyp of stomach and duodenum: Secondary | ICD-10-CM | POA: Diagnosis not present

## 2018-07-31 DIAGNOSIS — G43909 Migraine, unspecified, not intractable, without status migrainosus: Secondary | ICD-10-CM | POA: Insufficient documentation

## 2018-07-31 DIAGNOSIS — K319 Disease of stomach and duodenum, unspecified: Secondary | ICD-10-CM | POA: Diagnosis not present

## 2018-07-31 DIAGNOSIS — Z800 Family history of malignant neoplasm of digestive organs: Secondary | ICD-10-CM | POA: Insufficient documentation

## 2018-07-31 DIAGNOSIS — K210 Gastro-esophageal reflux disease with esophagitis: Secondary | ICD-10-CM | POA: Diagnosis not present

## 2018-07-31 DIAGNOSIS — K449 Diaphragmatic hernia without obstruction or gangrene: Secondary | ICD-10-CM | POA: Diagnosis not present

## 2018-07-31 DIAGNOSIS — Z79899 Other long term (current) drug therapy: Secondary | ICD-10-CM | POA: Diagnosis not present

## 2018-07-31 DIAGNOSIS — K635 Polyp of colon: Secondary | ICD-10-CM | POA: Diagnosis not present

## 2018-07-31 DIAGNOSIS — F419 Anxiety disorder, unspecified: Secondary | ICD-10-CM | POA: Diagnosis not present

## 2018-07-31 DIAGNOSIS — K2950 Unspecified chronic gastritis without bleeding: Secondary | ICD-10-CM | POA: Diagnosis not present

## 2018-07-31 DIAGNOSIS — K228 Other specified diseases of esophagus: Secondary | ICD-10-CM | POA: Diagnosis not present

## 2018-07-31 DIAGNOSIS — K3189 Other diseases of stomach and duodenum: Secondary | ICD-10-CM | POA: Diagnosis not present

## 2018-07-31 DIAGNOSIS — K2970 Gastritis, unspecified, without bleeding: Secondary | ICD-10-CM | POA: Diagnosis not present

## 2018-07-31 HISTORY — PX: ESOPHAGOGASTRODUODENOSCOPY (EGD) WITH PROPOFOL: SHX5813

## 2018-07-31 HISTORY — PX: COLONOSCOPY WITH PROPOFOL: SHX5780

## 2018-07-31 LAB — HM COLONOSCOPY

## 2018-07-31 SURGERY — COLONOSCOPY WITH PROPOFOL
Anesthesia: General

## 2018-07-31 MED ORDER — FENTANYL CITRATE (PF) 100 MCG/2ML IJ SOLN
INTRAMUSCULAR | Status: DC | PRN
  Administered 2018-07-31: 25 ug via INTRAVENOUS
  Administered 2018-07-31: 50 ug via INTRAVENOUS

## 2018-07-31 MED ORDER — SODIUM CHLORIDE 0.9 % IV SOLN
INTRAVENOUS | Status: DC
  Administered 2018-07-31: 10:00:00 via INTRAVENOUS
  Administered 2018-07-31: 1000 mL via INTRAVENOUS

## 2018-07-31 MED ORDER — LIDOCAINE HCL (CARDIAC) PF 100 MG/5ML IV SOSY
PREFILLED_SYRINGE | INTRAVENOUS | Status: DC | PRN
  Administered 2018-07-31: 80 mg via INTRATRACHEAL

## 2018-07-31 MED ORDER — FENTANYL CITRATE (PF) 100 MCG/2ML IJ SOLN
INTRAMUSCULAR | Status: AC
  Filled 2018-07-31: qty 2

## 2018-07-31 MED ORDER — MIDAZOLAM HCL 2 MG/2ML IJ SOLN
INTRAMUSCULAR | Status: AC
  Filled 2018-07-31: qty 2

## 2018-07-31 MED ORDER — MIDAZOLAM HCL 2 MG/2ML IJ SOLN
INTRAMUSCULAR | Status: DC | PRN
  Administered 2018-07-31 (×2): 1 mg via INTRAVENOUS

## 2018-07-31 MED ORDER — PROPOFOL 10 MG/ML IV BOLUS
INTRAVENOUS | Status: DC | PRN
  Administered 2018-07-31 (×2): 20 mg via INTRAVENOUS
  Administered 2018-07-31: 40 mg via INTRAVENOUS
  Administered 2018-07-31 (×3): 20 mg via INTRAVENOUS

## 2018-07-31 MED ORDER — LIDOCAINE HCL (PF) 2 % IJ SOLN
INTRAMUSCULAR | Status: AC
  Filled 2018-07-31: qty 10

## 2018-07-31 MED ORDER — PROPOFOL 500 MG/50ML IV EMUL
INTRAVENOUS | Status: DC | PRN
  Administered 2018-07-31: 180 ug/kg/min via INTRAVENOUS

## 2018-07-31 MED ORDER — EPHEDRINE SULFATE 50 MG/ML IJ SOLN
INTRAMUSCULAR | Status: DC | PRN
  Administered 2018-07-31: 10 mg via INTRAVENOUS
  Administered 2018-07-31: 5 mg via INTRAVENOUS
  Administered 2018-07-31: 10 mg via INTRAVENOUS

## 2018-07-31 MED ORDER — PROPOFOL 500 MG/50ML IV EMUL
INTRAVENOUS | Status: AC
  Filled 2018-07-31: qty 50

## 2018-07-31 NOTE — Op Note (Signed)
[facility] Gastroenterology Patient Name: Stephanie Henderson Procedure Date: 07/31/2018 9:38 AM MRN: [id number] Account #: [id number] Date of Birth: 1965-12-18 Admit Type: Outpatient Age: 53 Room: [facility] ENDO ROOM 2 Gender: Female Note Status: Finalized Procedure:            Upper GI endoscopy Indications:          Gastro-esophageal reflux disease Providers:            Lollie Sails, MD Referring MD:         Deborra Medina, MD (Referring MD) Medicines:            Monitored Anesthesia Care Complications:        No immediate complications. Procedure:            Pre-Anesthesia Assessment:                       - ASA Grade Assessment: II - A patient with mild                        systemic disease.                       After obtaining informed consent, the endoscope was                        passed under direct vision. Throughout the procedure,                        the patient's blood pressure, pulse, and oxygen                        saturations were monitored continuously. The Endoscope                        was introduced through the mouth, and advanced to the                        third part of duodenum. The upper GI endoscopy was                        accomplished without difficulty. The patient tolerated                        the procedure well. Findings:      The Z-line was variable. Biopsies were taken with a cold forceps for       histology.      A small hiatal hernia was found. The Z-line was a variable distance from       incisors; the hiatal hernia was sliding.      Diffuse minimal inflammation characterized by congestion (edema) and       erythema was found in the gastric body and in the gastric antrum.       Biopsies were taken with a cold forceps for histology. Biopsies were       taken with a cold forceps for Helicobacter pylori testing.      Multiple 1 to 6 mm pedunculated and sessile polyps with no bleeding and       no stigmata of recent  bleeding were found in the gastric body. Biopsies       were taken with a cold forceps for histology.  The exam of the stomach was otherwise normal.      The cardia and gastric fundus were normal on retroflexion otherwise.      The examined duodenum was normal. Impression:           - Z-line variable. Biopsied.                       - Small hiatal hernia.                       - Gastritis. Biopsied.                       - Multiple gastric polyps. Biopsied.                       - Normal examined duodenum. Recommendation:       - Discharge patient to home.                       - Await pathology results.                       - Continue present medications. Procedure Code(s):    --- Professional ---                       [phone], Esophagogastroduodenoscopy, flexible, transoral;                        with biopsy, single or multiple Diagnosis Code(s):    --- Professional ---                       K22.8, Other specified diseases of esophagus                       K44.9, Diaphragmatic hernia without obstruction or                        gangrene                       K29.70, Gastritis, unspecified, without bleeding                       K31.7, Polyp of stomach and duodenum                       K21.9, Gastro-esophageal reflux disease without                        esophagitis CPT copyright 2018 American Medical Association. All rights reserved. The codes documented in this report are preliminary and upon coder review may  be revised to meet current compliance requirements. Lollie Sails, MD 07/31/2018 10:02:19 AM This report has been signed electronically. Number of Addenda: 0 Note Initiated On: 07/31/2018 9:38 AM      [facility]

## 2018-07-31 NOTE — Anesthesia Post-op Follow-up Note (Signed)
Anesthesia QCDR form completed.        

## 2018-07-31 NOTE — Op Note (Signed)
[facility] Gastroenterology Patient Name: Stephanie Henderson Procedure Date: 07/31/2018 9:37 AM MRN: [id number] Account #: [id number] Date of Birth: May 11, 1966 Admit Type: Outpatient Age: 53 Room: [facility] ENDO ROOM 2 Gender: Female Note Status: Finalized Procedure:            Colonoscopy Indications:          Family history of colon cancer in a first-degree                        relative Providers:            Lollie Sails, MD Referring MD:         Deborra Medina, MD (Referring MD) Medicines:            Monitored Anesthesia Care Complications:        No immediate complications. Procedure:            Pre-Anesthesia Assessment:                       - ASA Grade Assessment: II - A patient with mild                        systemic disease.                       After obtaining informed consent, the colonoscope was                        passed under direct vision. Throughout the procedure,                        the patient's blood pressure, pulse, and oxygen                        saturations were monitored continuously. The                        Colonoscope was introduced through the anus and                        advanced to the the cecum, identified by appendiceal                        orifice and ileocecal valve. The colonoscopy was                        unusually difficult due to a tortuous colon. Successful                        completion of the procedure was aided by changing the                        patient to a supine position, changing the patient to a                        prone position, using manual pressure and withdrawing                        and reinserting the scope. Findings:      The sigmoid colon, descending colon and transverse colon were  significantly tortuous.      A 4 mm polyp was found in the hepatic flexure. The polyp was sessile.       The polyp was removed with a cold biopsy forceps. Resection and       retrieval were  complete.      The exam was otherwise normal throughout the examined colon.      The digital rectal exam was normal. Impression:           - Tortuous colon.                       - One 4 mm polyp at the hepatic flexure, removed with a                        cold biopsy forceps. Resected and retrieved. Recommendation:       - Await pathology results.                       - Telephone GI clinic for pathology results in 1 week. Procedure Code(s):    --- Professional ---                       [phone], Colonoscopy, flexible; with biopsy, single or                        multiple Diagnosis Code(s):    --- Professional ---                       D12.3, Benign neoplasm of transverse colon (hepatic                        flexure or splenic flexure)                       Z80.0, Family history of malignant neoplasm of                        digestive organs                       Q43.8, Other specified congenital malformations of                        intestine CPT copyright 2018 American Medical Association. All rights reserved. The codes documented in this report are preliminary and upon coder review may  be revised to meet current compliance requirements. Lollie Sails, MD 07/31/2018 10:45:48 AM This report has been signed electronically. Number of Addenda: 0 Note Initiated On: 07/31/2018 9:37 AM Scope Withdrawal Time: 0 hours 14 minutes 36 seconds  Total Procedure Duration: 0 hours 37 minutes 39 seconds       [facility]

## 2018-07-31 NOTE — Anesthesia Postprocedure Evaluation (Signed)
Anesthesia Post Note  Patient: Stephanie Henderson  Procedure(s) Performed: COLONOSCOPY WITH PROPOFOL (N/A ) ESOPHAGOGASTRODUODENOSCOPY (EGD) WITH PROPOFOL (N/A )  Patient location during evaluation: PACU Anesthesia Type: General Level of consciousness: awake and alert Pain management: pain level controlled Vital Signs Assessment: post-procedure vital signs reviewed and stable Respiratory status: spontaneous breathing, nonlabored ventilation and respiratory function stable Cardiovascular status: blood pressure returned to baseline and stable Postop Assessment: no apparent nausea or vomiting Anesthetic complications: no     Last Vitals:  Vitals:   07/31/18 1103 07/31/18 1116  BP: 97/61 94/67  Pulse: 90   Resp: 18 18  Temp:    SpO2:      Last Pain:  Vitals:   07/31/18 1116  TempSrc:   PainSc: 0-No pain                 Durenda Hurt

## 2018-07-31 NOTE — H&P (Signed)
Outpatient short stay form Pre-procedure 07/31/2018 9:29 AM Stephanie Sails MD  Primary Physician: Dr Deborra Medina  Reason for visit: EGD and colonoscopy  History of present illness: Patient is a 53 year old female presenting today EGD and colonoscopy.  No problems with rectal bleeding diarrhea or abdominal pain.  There is a family history of colon cancer primary relative, father.  Has a history of GERD with esophagitis on EGD previously.  She does take a proton pump inhibitor and has been doing well with this.  There is no current reflux or dysphagia.  She tolerated her prep well.  She takes no aspirin or blood thinning agent.    Current Facility-Administered Medications:  .  0.9 %  sodium chloride infusion, , Intravenous, Continuous, Stephanie Sails, MD, Last Rate: 20 mL/hr at 07/31/18 0927, 1,000 mL at 07/31/18 7353  Medications Prior to Admission  Medication Sig Dispense Refill Last Dose  . ALPRAZolam (XANAX) 0.5 MG tablet Take 1 tablet (0.5 mg total) by mouth at bedtime as needed for anxiety. 30 tablet 1 07/30/2018 at Unknown time  . cyclobenzaprine (FLEXERIL) 10 MG tablet Take 1 tablet (10 mg total) by mouth at bedtime. 30 tablet 11 07/30/2018 at Unknown time  . diazepam (VALIUM) 5 MG tablet One tab by mouth, 2 hours before procedure. 2 tablet 0 07/30/2018 at Unknown time  . diclofenac sodium (VOLTAREN) 1 % GEL Apply 2 g topically 4 (four) times daily. To affected joint. 100 g 11 07/30/2018 at Unknown time  . famotidine (PEPCID) 40 MG tablet Take 1 tablet (40 mg total) by mouth daily. 90 tablet 1 07/30/2018 at Unknown time  . levofloxacin (LEVAQUIN) 500 MG tablet Take 1 tablet (500 mg total) by mouth daily. 7 tablet 0 07/30/2018 at Unknown time  . pantoprazole (PROTONIX) 40 MG tablet Take 1 tablet (40 mg total) by mouth daily. 90 tablet 2 07/30/2018 at Unknown time  . sucralfate (CARAFATE) 1 G tablet Take 1 g by mouth 3 (three) times daily as needed.     07/30/2018 at Unknown time  .  Vitamin D, Ergocalciferol, (DRISDOL) 1.25 MG (50000 UT) CAPS capsule Take 1 capsule (50,000 Units total) by mouth every 7 (seven) days. 12 capsule 0 07/30/2018 at Unknown time  . fluticasone (FLONASE) 50 MCG/ACT nasal spray Place 1 spray into both nostrils 2 (two) times daily for 10 days. 1 g 0   . SUMAtriptan (IMITREX) 100 MG tablet Take 1 tablet (100 mg total) by mouth once for 1 dose. May repeat in 2 hours if needed 10 tablet 5      Allergies  Allergen Reactions  . Propoxyphene     Other reaction(s): Other (See Comments) Unknown  . Tetracyclines & Related Diarrhea and Nausea Only     Past Medical History:  Diagnosis Date  . Fibroid   . GERD (gastroesophageal reflux disease) Aug 2012   with esophagitis by EGD  . Hematuria   . Hx of colonic polyps August 2012   repeat due 2016,  5 polyps 2009, clear 2012 Gustavo Lah)  . Menorrhagia    did not tolerate trial of ocps due to migraines  . Migraine headache   . Migraine syndrome    since age 8,  sporadic    Review of systems:      Physical Exam    Heart and lungs: Regular rate and rhythm without rub or gallop, lungs are bilaterally clear.    HEENT: Normocephalic atraumatic eyes are anicteric    Other:  Pertinant exam for procedure: Soft nontender nondistended bowel sounds positive normoactive    Planned proceedures: EGD, colonoscopy and indicated procedures. I have discussed the risks benefits and complications of procedures to include not limited to bleeding, infection, perforation and the risk of sedation and the patient wishes to proceed.    Stephanie Sails, MD Gastroenterology 07/31/2018  9:29 AM

## 2018-07-31 NOTE — Anesthesia Preprocedure Evaluation (Addendum)
Anesthesia Evaluation  Patient identified by MRN, date of birth, ID band Patient awake    Reviewed: Allergy & Precautions, H&P , NPO status , Patient's Chart, lab work & pertinent test results  Airway Mallampati: II  TM Distance: >3 FB     Dental  (+) Teeth Intact   Pulmonary neg pulmonary ROS, neg COPD, neg recent URI,           Cardiovascular (-) hypertension(-) angina(-) Past MI, (-) Cardiac Stents and (-) CABG negative cardio ROS  (-) dysrhythmias      Neuro/Psych  Headaches, PSYCHIATRIC DISORDERS Anxiety    GI/Hepatic Neg liver ROS, GERD  Controlled,  Endo/Other  negative endocrine ROS  Renal/GU negative Renal ROS  negative genitourinary   Musculoskeletal   Abdominal   Peds  Hematology negative hematology ROS (+)   Anesthesia Other Findings Past Medical History: No date: Fibroid Aug 2012: GERD (gastroesophageal reflux disease)     Comment:  with esophagitis by EGD No date: Hematuria August 2012: Hx of colonic polyps     Comment:  repeat due 2016,  5 polyps 2009, clear 2012 ([place]) No date: Menorrhagia     Comment:  did not tolerate trial of ocps due to migraines No date: Migraine headache No date: Migraine syndrome     Comment:  since age 89,  sporadic  Past Surgical History: 2017: BREAST BIOPSY; Right     Comment:  benign No date: CYSTOSCOPY No date: laparoscopy 6/13: VAGINAL HYSTERECTOMY     Comment:  with morcellation  BMI    Body Mass Index:  19.39 kg/m      Reproductive/Obstetrics negative OB ROS                            Anesthesia Physical Anesthesia Plan  ASA: II  Anesthesia Plan: General   Post-op Pain Management:    Induction:   PONV Risk Score and Plan: Propofol infusion and TIVA  Airway Management Planned: Nasal Cannula  Additional Equipment:   Intra-op Plan:   Post-operative Plan:   Informed Consent: I have reviewed the patients  History and Physical, chart, labs and discussed the procedure including the risks, benefits and alternatives for the proposed anesthesia with the patient or authorized representative who has indicated his/her understanding and acceptance.     Dental Advisory Given  Plan Discussed with: Anesthesiologist and CRNA  Anesthesia Plan Comments:         Anesthesia Quick Evaluation

## 2018-07-31 NOTE — Anesthesia Procedure Notes (Signed)
Date/Time: 07/31/2018 9:45 AM Performed by: Allean Found, CRNA Pre-anesthesia Checklist: Patient identified, Emergency Drugs available, Suction available, Patient being monitored and Timeout performed Patient Re-evaluated:Patient Re-evaluated prior to induction Oxygen Delivery Method: Nasal cannula Placement Confirmation: positive ETCO2

## 2018-07-31 NOTE — Transfer of Care (Signed)
Immediate Anesthesia Transfer of Care Note  Patient: Stephanie Henderson  Procedure(s) Performed: COLONOSCOPY WITH PROPOFOL (N/A ) ESOPHAGOGASTRODUODENOSCOPY (EGD) WITH PROPOFOL (N/A )  Patient Location: PACU  Anesthesia Type:General  Level of Consciousness: oriented  Airway & Oxygen Therapy: Patient Spontanous Breathing and Patient connected to nasal cannula oxygen  Post-op Assessment: Report given to RN and Post -op Vital signs reviewed and stable  Post vital signs: Reviewed and stable  Last Vitals:  Vitals Value Taken Time  BP    Temp    Pulse    Resp    SpO2      Last Pain:  Vitals:   07/31/18 1050  TempSrc: (P) Tympanic  PainSc:          Complications: No apparent anesthesia complications

## 2018-08-01 LAB — SURGICAL PATHOLOGY

## 2018-08-02 ENCOUNTER — Encounter: Payer: Self-pay | Admitting: Internal Medicine

## 2018-08-02 DIAGNOSIS — K2100 Gastro-esophageal reflux disease with esophagitis, without bleeding: Secondary | ICD-10-CM | POA: Insufficient documentation

## 2018-08-03 ENCOUNTER — Ambulatory Visit: Payer: 59 | Admitting: Physical Therapy

## 2018-08-03 ENCOUNTER — Encounter: Payer: Self-pay | Admitting: Physical Therapy

## 2018-08-03 ENCOUNTER — Other Ambulatory Visit: Payer: Self-pay

## 2018-08-03 DIAGNOSIS — R293 Abnormal posture: Secondary | ICD-10-CM | POA: Diagnosis not present

## 2018-08-03 DIAGNOSIS — G8929 Other chronic pain: Secondary | ICD-10-CM | POA: Diagnosis not present

## 2018-08-03 DIAGNOSIS — M25511 Pain in right shoulder: Principal | ICD-10-CM

## 2018-08-03 NOTE — Therapy (Signed)
[place] [facility] [address] [place], [address] Phone: [phone]   Fax:  [phone]  Physical Therapy Treatment  Patient Details  Name: Stephanie Henderson MRN: [id number] Date of Birth: 1966/03/24 Referring Provider (PT): Derrel Nip MD   Encounter Date: 08/03/2018  PT End of Session - 08/09/18 1124    Visit Number  23    Number of Visits  33    Date for PT Re-Evaluation  09/17/18    Authorization Time Period  Recert 01/17/43-10/16/84    PT Start Time  0400    PT Stop Time  0450    PT Time Calculation (min)  50 min    Activity Tolerance  Patient tolerated treatment well    Behavior During Therapy  [facility] for tasks assessed/performed       Past Medical History:  Diagnosis Date  . Fibroid   . GERD (gastroesophageal reflux disease) Aug 2012   with esophagitis by EGD  . Hematuria   . Hx of colonic polyps August 2012   repeat due 2016,  5 polyps 2009, clear 2012 Gustavo Lah)  . Menorrhagia    did not tolerate trial of ocps due to migraines  . Migraine headache   . Migraine syndrome    since age 42,  sporadic    Past Surgical History:  Procedure Laterality Date  . BREAST BIOPSY Right 2017   benign  . COLONOSCOPY WITH PROPOFOL N/A 07/31/2018   Procedure: COLONOSCOPY WITH PROPOFOL;  Surgeon: Lollie Sails, MD;  Location: [facility] ENDOSCOPY;  Service: Endoscopy;  Laterality: N/A;  . CYSTOSCOPY    . ESOPHAGOGASTRODUODENOSCOPY (EGD) WITH PROPOFOL N/A 07/31/2018   Procedure: ESOPHAGOGASTRODUODENOSCOPY (EGD) WITH PROPOFOL;  Surgeon: Lollie Sails, MD;  Location: [facility] ENDOSCOPY;  Service: Endoscopy;  Laterality: N/A;  . laparoscopy    . VAGINAL HYSTERECTOMY  6/13   with morcellation    There were no vitals filed for this visit.    Ther-Ex - Pulleys x20 flexion; x20 abd with min cuing for 3sec hold - Review of HEP for weeks clinic will be closed d/t COVID-19  Manual - STM withtrigger point releaseto, R UT, R  pec minor/major, R teres minor/latissimusandRmedialdeltoid. (3)28m .30needles placed along the R UT, and R medial deltoidwith pincer grasptechniqueto decrease increased muscular spasms and trigger points with the patient positioned inprone and supine.Good local twitch response noted.Patient was educated on risks and benefits of therapy and verbally consents to PT. - Multiple bouts of PROM into flex/abd/IR/ER (rotation at80d of abd) with10sec holds in max range of patient tolerance; increased difficulty with ER - GHJ PA grade III mobs 30sec bouts 8 bouts in 80d of abd  ESTIM+ heat packHiVolt ESTIM161m at patient tolerated100Vincreased to140V through treatmentatR UT and middle deltoidarea. Attempted d/t success of treatment at previous session success. With PT assessing patient tolerance throughout (increasing intensity as needed), monitoring skin integrity (normal), with decreased pain noted from patientfollowing and slight increase in IR and ER                          PT Short Term Goals - 07/18/18 1631      PT SHORT TERM GOAL #1   Title  --        PT Long Term Goals - 07/18/18 1631      PT LONG TERM GOAL #1   Title  Patient will increase FOTO score to 71 to demonstrate predicted increase in functional  mobility to complete ADLs    Baseline  05/22/18   51    Time  8    Period  Weeks    Status  On-going      PT LONG TERM GOAL #2   Title  Patient will demonstrate symmetrical, full gross shoulder AROM in order to return to PLOF and complete ADLs    Baseline  05/22/18: L wnl all motions R: flex 121d ; abd 75d ; IR L1 ; ER CTJ    Time  8    Period  Weeks    Status  On-going      PT LONG TERM GOAL #3   Title  Patient will demonstrate gross shoulder strength 4+/5 or better to return to PLOF, and to be able to safely complete     Baseline  05/22/18 R ER 5/5 ; IR 4+/5 flex 4+/5 ; abd 4+/5    Time  8    Period  Weeks    Status  Partially  Met      PT LONG TERM GOAL #4   Title  Pt will decrease worst pain as reported on NPRS by at least 3 points in order to demonstrate clinically significant reduction in pain.    Baseline  05/22/18 2/10    Time  8    Period  Weeks    Status  Achieved            Plan - 08/09/18 1127    Clinical Impression Statement  PT continued pain management techniques, which patient reports good pain relief from. PT reviewed HEP with patient and trajectory of frozen shoulder, for weeks clinic will be closed. Patient verbalized understanding of all education. Will returen to clinic following re-openning    Stability/Clinical Decision Making  Evolving/Moderate complexity    Rehab Potential  Good    Clinical Impairments Affecting Rehab Potential  (+) fairly yong age, motivation (-) Sedentary lifestyle, chronicity of pain, hypersensitivity, psycho-social factors    PT Frequency  2x / week    PT Duration  8 weeks    PT Treatment/Interventions  Moist Heat;Traction;Ultrasound;Cryotherapy;Fluidtherapy;Electrical Stimulation;Therapeutic exercise;Taping;Patient/family education;Therapeutic activities;Functional mobility training;Neuromuscular re-education;Passive range of motion;Dry needling;Manual techniques    PT Next Visit Plan  If pt agreeable shift toward strengthening in available range.    PT Home Exercise Plan  Supine pec stretch, supine IR stretch, pulleys AAROM flex/abd, scapular retractions    Consulted and Agree with Plan of Care  Patient       Patient will benefit from skilled therapeutic intervention in order to improve the following deficits and impairments:  Improper body mechanics, Pain, Postural dysfunction, Impaired tone, Decreased activity tolerance, Decreased endurance, Decreased range of motion, Decreased strength, Impaired UE functional use  Visit Diagnosis: Chronic right shoulder pain     Problem List Patient Active Problem List   Diagnosis Date Noted  . Reflux esophagitis  08/02/2018  . Right shoulder pain 07/13/2018  . Frozen shoulder syndrome 06/13/2018  . Encounter for preventative adult health care exam with abnormal findings 05/27/2018  . Raynaud phenomenon 05/28/2017  . Generalized anxiety disorder 05/29/2016  . S/P hysterectomy 05/29/2016  . Headache 04/14/2016  . History of mammogram 03/02/2011  . Migraine syndrome   . Hx of colonic polyps   . GERD (gastroesophageal reflux disease) 12/16/2010   Shelton Silvas PT, DPT Shelton Silvas 08/09/2018, 11:29 AM  [place] [facility] [address], [place], [address] Phone: [phone]   Fax:  [phone]  Name: YARITHZA MINK MRN: [id number] Date of Birth: 12-Dec-1965

## 2018-08-09 ENCOUNTER — Encounter: Payer: Self-pay | Admitting: Physical Therapy

## 2018-08-10 ENCOUNTER — Ambulatory Visit: Payer: 59 | Admitting: Physical Therapy

## 2018-08-17 ENCOUNTER — Encounter: Payer: 59 | Admitting: Physical Therapy

## 2018-08-21 ENCOUNTER — Encounter: Payer: Self-pay | Admitting: *Deleted

## 2018-08-24 ENCOUNTER — Encounter: Payer: 59 | Admitting: Physical Therapy

## 2018-08-29 NOTE — Therapy (Signed)
[place] MAIN [facility] SERVICES [address] [place], [place], [address] Phone: [phone]   Fax:  [phone]  Patient Details  Name: Stephanie Henderson MRN: [id number] Date of Birth: [birth year] Referring Provider:  No ref. provider found  Encounter Date: 08/29/2018  The [facility] outpatient clinics are closed at this time due to the COVID-19 epidemic. The patient was contacted in regards to their therapy services. The patient is in agreement that they are safe and consent to being on hold for therapy services until the [facility] outpatient facilities reopen. At which time, the patient will be contacted to schedule an appointment to resume therapy services.      Blythe Stanford, PT DPT 08/29/2018, 10:34 AM  [place] [facility] SERVICES [address] [place], [place], [address] Phone: [phone]   Fax:  [phone]

## 2018-09-19 ENCOUNTER — Other Ambulatory Visit: Payer: Self-pay

## 2018-09-19 ENCOUNTER — Ambulatory Visit: Payer: 59 | Attending: Internal Medicine | Admitting: Physical Therapy

## 2018-09-19 ENCOUNTER — Encounter: Payer: Self-pay | Admitting: Physical Therapy

## 2018-09-19 DIAGNOSIS — Z7984 Long term (current) use of oral hypoglycemic drugs: Secondary | ICD-10-CM | POA: Diagnosis not present

## 2018-09-19 DIAGNOSIS — M25511 Pain in right shoulder: Secondary | ICD-10-CM | POA: Insufficient documentation

## 2018-09-19 DIAGNOSIS — G8929 Other chronic pain: Secondary | ICD-10-CM | POA: Diagnosis not present

## 2018-09-19 NOTE — Therapy (Signed)
[facility] [address] [place], [address] Phone: [phone]   Fax:  [phone]  Physical Therapy Treatment  Patient Details  Name: Stephanie Henderson MRN: [id number] Date of Birth: 10/31/1965 Referring Provider (PT): Derrel Nip MD   Encounter Date: 09/19/2018    Past Medical History:  Diagnosis Date  . Fibroid   . GERD (gastroesophageal reflux disease) Aug 2012   with esophagitis by EGD  . Hematuria   . Hx of colonic polyps August 2012   repeat due 2016,  5 polyps 2009, clear 2012 Gustavo Lah)  . Menorrhagia    did not tolerate trial of ocps due to migraines  . Migraine headache   . Migraine syndrome    since age 58,  sporadic    Past Surgical History:  Procedure Laterality Date  . BREAST BIOPSY Right 2017   benign  . COLONOSCOPY WITH PROPOFOL N/A 07/31/2018   Procedure: COLONOSCOPY WITH PROPOFOL;  Surgeon: Lollie Sails, MD;  Location: [facility] ENDOSCOPY;  Service: Endoscopy;  Laterality: N/A;  . CYSTOSCOPY    . ESOPHAGOGASTRODUODENOSCOPY (EGD) WITH PROPOFOL N/A 07/31/2018   Procedure: ESOPHAGOGASTRODUODENOSCOPY (EGD) WITH PROPOFOL;  Surgeon: Lollie Sails, MD;  Location: [facility] ENDOSCOPY;  Service: Endoscopy;  Laterality: N/A;  . laparoscopy    . VAGINAL HYSTERECTOMY  6/13   with morcellation    There were no vitals filed for this visit.  Subjective Assessment - 09/19/18 1528    Subjective  Patient reports her pain is doing better, worst pain 2/10 at this time. Reports some ROM improvement. Reports she is compliant with HEP.     Pertinent History  Patient is a 53 year old female with R shoulder pain that began August of this year when she took something out of her pocket and had intense lateral shoulder pain with "tingling". Patient reports pain was intermittent through Sept, but in Oct became pretty constant. Patient reports pain is sharp at the lateral shoulder, with a dull ache in ant and post  shoulder, and that she is unable to sleep on that side. Patient reports most pain with "bringing her arm out to the side, with pain with behind the back motions as well. Patient reports shearing pain at lateral shoulder, with dull ache in post and ant shoulder- occasssional hand "tingling" and forearm ache. Patient reported worst pain over the past week 8/10; best 3/10. Pt denies N/V, B&B, unexplained weight fluctuation, saddle paresthesia, fever, night sweats, or unrelenting night pain at this time.    Limitations  Lifting;House hold activities    How long can you sit comfortably?  unlimited    How long can you stand comfortably?  unlimited    How long can you walk comfortably?  unlimited    Diagnostic tests  none at this time    Pain Onset  More than a month ago        PROM flex 140d abd 108d IR 70d ER 38d   Ther-Ex - High pull row 10# 3x 8 - Bent over rows 8# DB x10 with patient having difficulty with positioning changed to unilateral row with chair 2x 10 with neutral grip; 2x 10 with supinated grip - Overhead press w/ single 8# DB 3x 10 (attempted b/l; unable)  Manual. - Multiple bouts of PROM into flex/abd/IR/ER (rotation at80d of abd) with10sec holds in max range of patient tolerance; increased difficulty with ER - GHJ PA grade III mobs 30sec bouts 8 bouts in 80d of  abd  ESTIM+ heat packHiVolt ESTIM57min at patient tolerated115Vincreased to150V through treatmentatR UT and middle deltoidarea. Attempted d/t success of treatment at previous session success. With PT assessing patient tolerance throughout (increasing intensity as needed), monitoring skin integrity (normal), with decreased pain noted from patientfollowing and less pain with PROM following                     PT Short Term Goals - 07/18/18 1631      PT SHORT TERM GOAL #1   Title  --        PT Long Term Goals - 09/19/18 1529      PT LONG TERM GOAL #1   Title  Patient will  increase FOTO score to 71 to demonstrate predicted increase in functional mobility to complete ADLs    Baseline  09/19/18    Time  8    Period  Weeks    Status  Revised      PT LONG TERM GOAL #2   Title  Patient will demonstrate symmetrical, full gross shoulder AROM in order to return to PLOF and complete ADLs    Baseline  09/19/18: L wnl all motions R: flex 120d ; abd 91d ; IR T10 ; ER CTJ    Time  8    Period  Weeks    Status  Revised      PT LONG TERM GOAL #3   Title  Patient will demonstrate gross shoulder strength 4+/5 or better to return to PLOF, and to be able to safely complete     Baseline  09/19/18 R ER 5/5 ; IR 5/5 flex 4+/5 ; abd 4/5    Time  8    Period  Weeks    Status  Revised      PT LONG TERM GOAL #4   Title  Pt will decrease worst pain as reported on NPRS by at least 3 points in order to demonstrate clinically significant reduction in pain.    Baseline  5/520 2/10    Time  8    Period  Weeks    Status  Achieved              Patient will benefit from skilled therapeutic intervention in order to improve the following deficits and impairments:     Visit Diagnosis: No diagnosis found.     Problem List Patient Active Problem List   Diagnosis Date Noted  . Reflux esophagitis 08/02/2018  . Right shoulder pain 07/13/2018  . Frozen shoulder syndrome 06/13/2018  . Encounter for preventative adult health care exam with abnormal findings 05/27/2018  . Raynaud phenomenon 05/28/2017  . Generalized anxiety disorder 05/29/2016  . S/P hysterectomy 05/29/2016  . Headache 04/14/2016  . History of mammogram 03/02/2011  . Migraine syndrome   . Hx of colonic polyps   . GERD (gastroesophageal reflux disease) 12/16/2010   Shelton Silvas PT, DPT Shelton Silvas 09/19/2018, 3:33 PM  [facility] [address], [place], [address] Phone: [phone]   Fax:  [phone]  Name: Stephanie Henderson MRN:  [id number] Date of Birth: 04-May-1966

## 2018-09-21 ENCOUNTER — Other Ambulatory Visit: Payer: Self-pay

## 2018-09-21 ENCOUNTER — Encounter: Payer: Self-pay | Admitting: Physical Therapy

## 2018-09-21 ENCOUNTER — Ambulatory Visit: Payer: 59 | Admitting: Physical Therapy

## 2018-09-21 DIAGNOSIS — G8929 Other chronic pain: Secondary | ICD-10-CM

## 2018-09-21 DIAGNOSIS — M25511 Pain in right shoulder: Principal | ICD-10-CM

## 2018-09-21 DIAGNOSIS — Z7984 Long term (current) use of oral hypoglycemic drugs: Secondary | ICD-10-CM | POA: Diagnosis not present

## 2018-09-21 NOTE — Therapy (Signed)
[facility] [address] [place], [address] Phone: [phone]   Fax:  [phone]  Physical Therapy Treatment  Patient Details  Name: Stephanie Henderson MRN: [id number] Date of Birth: [birth year] Referring Provider (PT): Derrel Nip MD   Encounter Date: 09/21/2018  PT End of Session - 09/21/18 1610    Visit Number  2    Number of Visits  17    Date for PT Re-Evaluation  11/14/18    PT Start Time  0334    PT Stop Time  0420    PT Time Calculation (min)  46 min    Activity Tolerance  Patient tolerated treatment well    Behavior During Therapy  [facility] for tasks assessed/performed       Past Medical History:  Diagnosis Date  . Fibroid   . GERD (gastroesophageal reflux disease) Aug 2012   with esophagitis by EGD  . Hematuria   . Hx of colonic polyps August 2012   repeat due 2016,  5 polyps 2009, clear 2012 Gustavo Lah)  . Menorrhagia    did not tolerate trial of ocps due to migraines  . Migraine headache   . Migraine syndrome    since age 30,  sporadic    Past Surgical History:  Procedure Laterality Date  . BREAST BIOPSY Right 2017   benign  . COLONOSCOPY WITH PROPOFOL N/A 07/31/2018   Procedure: COLONOSCOPY WITH PROPOFOL;  Surgeon: Lollie Sails, MD;  Location[facility] ENDOSCOPY;  Service: Endoscopy;  Laterality: N/A;  . CYSTOSCOPY    . ESOPHAGOGASTRODUODENOSCOPY (EGD) WITH PROPOFOL N/A 07/31/2018   Procedure: ESOPHAGOGASTRODUODENOSCOPY (EGD) WITH PROPOFOL;  Surgeon: Lollie Sails, MD;  Location: [facility] ENDOSCOPY;  Service: Endoscopy;  Laterality: N/A;  . laparoscopy    . VAGINAL HYSTERECTOMY  6/13   with morcellation    There were no vitals filed for this visit.  Subjective Assessment - 09/21/18 1536    Subjective  Patient reports compliance with HEP with no questions or concerns. Patient reports feeling better following last session.     Pertinent History  Patient is a 53 year old female with R  shoulder pain that began August of this year when she took something out of her pocket and had intense lateral shoulder pain with "tingling". Patient reports pain was intermittent through Sept, but in Oct became pretty constant. Patient reports pain is sharp at the lateral shoulder, with a dull ache in ant and post shoulder, and that she is unable to sleep on that side. Patient reports most pain with "bringing her arm out to the side, with pain with behind the back motions as well. Patient reports shearing pain at lateral shoulder, with dull ache in post and ant shoulder- occasssional hand "tingling" and forearm ache. Patient reported worst pain over the past week 8/10; best 3/10. Pt denies N/V, B&B, unexplained weight fluctuation, saddle paresthesia, fever, night sweats, or unrelenting night pain at this time.    Limitations  Lifting;House hold activities    How long can you sit comfortably?  unlimited    How long can you stand comfortably?  unlimited    How long can you walk comfortably?  unlimited    Diagnostic tests  none at this time    Pain Onset  More than a month ago       Ther-Ex - Standing low rows 10# 3x 10 with min cuing initially to prevent shoulder hiking - Attempted prone Y, patient unable  -  Prone 90/90 3x 6 with cuing for proper form without compensation, very difficult for patient - Prone I 3x 10 with min cuing initially for shoulder depression with good carry over following During ESTIm demonstrated diaphragmatic breathing to patient with TC of "one hand on stomach and one hand on chest". PT educated patient on decreasing tension at accessory muscles by using diaphragmatic breathing and "proper" breathing techniques, instead of compensating with accessory muscles. HEP updated to include prone 90/90 lift, with demo of modification at wall.   Manual. - Multiple bouts of PROM into flex/abd/IR/ER (rotation at80d of abd) with10sec holds in max range of patient tolerance; increased  difficulty with ER - STM with trigger point release to R UT and R lateral deltoid. Following:    ESTIM+ heat packHiVolt ESTIM55min at patient tolerated155Vincreased to170V through treatmentatR UT and middle deltoidarea. Attempted d/t success of treatment at previous session success. With PT assessing patient tolerance throughout (increasing intensity as needed), monitoring skin integrity (normal), with decreased pain noted from patientfollowing and less pain with PROM following.                         PT Education - 09/21/18 1602    Education Details  Diaphragmatic breathing, exercise form    Person(s) Educated  Patient    Methods  Explanation;Demonstration;Verbal cues;Tactile cues    Comprehension  Verbalized understanding;Returned demonstration;Verbal cues required;Tactile cues required       PT Short Term Goals - 09/19/18 1552      PT SHORT TERM GOAL #1   Title  Pt will be independent with HEP in order to improve strength and balance in order to decrease fall risk and improve function at home and work    Time  4    Period  Weeks    Status  New        PT Long Term Goals - 09/19/18 1529      PT LONG TERM GOAL #1   Title  Patient will increase FOTO score to 71 to demonstrate predicted increase in functional mobility to complete ADLs    Baseline  09/19/18 62    Time  8    Period  Weeks    Status  Revised      PT LONG TERM GOAL #2   Title  Patient will demonstrate symmetrical, full gross shoulder AROM in order to return to PLOF and complete ADLs    Baseline  09/19/18: L wnl all motions R: flex 120d ; abd 91d ; IR T10 ; ER CTJ    Time  8    Period  Weeks    Status  Revised      PT LONG TERM GOAL #3   Title  Patient will demonstrate gross shoulder strength 4+/5 or better to return to PLOF, and to be able to safely complete     Baseline  09/19/18 R ER 5/5 ; IR 5/5 flex 4+/5 ; abd 4/5    Time  8    Period  Weeks    Status  Revised      PT LONG  TERM GOAL #4   Title  Pt will decrease worst pain as reported on NPRS by at least 3 points in order to demonstrate clinically significant reduction in pain.    Baseline  5/520 2/10    Time  8    Period  Weeks    Status  Achieved  Plan - 09/21/18 1625    Clinical Impression Statement  Patient continuing to respond well to manual and TDN techniques to decrease shoulder hiking and muscle tension. Patient demonstrates decreased activation/weakness of periscapular musclulature this session. Patient is able to complete all therex with accuracy following PT cuing, with increased difficulty of periscapular activation in postions agains gravity. Pt will continue to benefit from skilled PT to address these impairments and increase RUE function     Personal Factors and Comorbidities  Past/Current Experience;Fitness;Sex;Time since onset of injury/illness/exacerbation    Examination-Activity Limitations  Carry;Reach Overhead;Caring for Others;Lift    Examination-Participation Restrictions  Cleaning;Laundry;Community Activity;Driving    Rehab Potential  Good    Clinical Impairments Affecting Rehab Potential  (+) fairly yong age, motivation (-) Sedentary lifestyle, chronicity of pain, hypersensitivity, psycho-social factors    PT Frequency  2x / week    PT Duration  8 weeks    PT Treatment/Interventions  Moist Heat;Traction;Ultrasound;Cryotherapy;Fluidtherapy;Electrical Stimulation;Therapeutic exercise;Taping;Patient/family education;Therapeutic activities;Functional mobility training;Neuromuscular re-education;Passive range of motion;Dry needling;Manual techniques    PT Next Visit Plan  If pt agreeable shift toward strengthening in available range.    PT Home Exercise Plan  Supine pec stretch, supine IR stretch, pulleys AAROM flex/abd, scapular retractions    Consulted and Agree with Plan of Care  Patient       Patient will benefit from skilled therapeutic intervention in order to improve  the following deficits and impairments:  Improper body mechanics, Pain, Postural dysfunction, Impaired tone, Decreased activity tolerance, Decreased endurance, Decreased range of motion, Decreased strength, Impaired UE functional use  Visit Diagnosis: Chronic right shoulder pain     Problem List Patient Active Problem List   Diagnosis Date Noted  . Reflux esophagitis 08/02/2018  . Right shoulder pain 07/13/2018  . Frozen shoulder syndrome 06/13/2018  . Encounter for preventative adult health care exam with abnormal findings 05/27/2018  . Raynaud phenomenon 05/28/2017  . Generalized anxiety disorder 05/29/2016  . S/P hysterectomy 05/29/2016  . Headache 04/14/2016  . History of mammogram 03/02/2011  . Migraine syndrome   . Hx of colonic polyps   . GERD (gastroesophageal reflux disease) 12/16/2010   Shelton Silvas PT, DPT Shelton Silvas 09/21/2018, 4:27 PM  [facility] [address] [place], [address] Phone: [phone]   Fax:  [phone]  Name: Stephanie Henderson MRN: [id number] Date of Birth: [birth year]

## 2018-09-26 ENCOUNTER — Ambulatory Visit: Payer: 59 | Admitting: Physical Therapy

## 2018-09-28 ENCOUNTER — Ambulatory Visit (INDEPENDENT_AMBULATORY_CARE_PROVIDER_SITE_OTHER)
Admission: RE | Admit: 2018-09-28 | Discharge: 2018-09-28 | Disposition: A | Discharge status: Home / Self Care | Payer: 59 | Source: Ambulatory Visit

## 2018-09-28 ENCOUNTER — Ambulatory Visit: Payer: 59 | Admitting: Physical Therapy

## 2018-09-28 ENCOUNTER — Telehealth: Payer: 59

## 2018-09-28 DIAGNOSIS — H00034 Abscess of left upper eyelid: Secondary | ICD-10-CM

## 2018-09-28 DIAGNOSIS — L03213 Periorbital cellulitis: Secondary | ICD-10-CM

## 2018-09-28 MED ORDER — AMOXICILLIN-POT CLAVULANATE 875-125 MG PO TABS: 1.0000 | ORAL_TABLET | Freq: Two times a day (BID) | ORAL | 0 refills | Status: DC

## 2018-09-28 NOTE — ED Provider Notes (Signed)
Virtual Visit via Video Note:  RAILYNN BALLO  initiated request for Telemedicine visit with [facility] team. I connected with Cheri Guppy  on 09/28/2018 at 10:38 AM  for a synchronized telemedicine visit using a video enabled HIPPA compliant telemedicine application. I verified that I am speaking with Cheri Guppy  using two identifiers. Orvan July, NP  was physically located in a [facility] Urgent care site and FARIHA GOTO was located at a different location.   The limitations of evaluation and management by telemedicine as well as the availability of in-person appointments were discussed. Patient was informed that she  may incur a bill ( including co-pay) for this virtual visit encounter. Cheri Guppy  expressed understanding and gave verbal consent to proceed with virtual visit.     History of Present Illness:Stephanie Henderson  is a 53 y.o. female presents with swelling to the left eyelid and left inner eyeans lower lid . Reports this all started with a bump to the left upper eyelid that has been there for a while.  It suddenly became very red, irritated and painful.  Then shortly after the eyelid and surrounding the eye started to swell.  Denies any drainage from the area.  Denies any trouble with vision or redness of the eye.  Denies any foreign body.  Denies any fever.  She has not done anything to treat the symptoms.  Past Medical History:  Diagnosis Date  . Fibroid   . GERD (gastroesophageal reflux disease) Aug 2012   with esophagitis by EGD  . Hematuria   . Hx of colonic polyps August 2012   repeat due 2016,  5 polyps 2009, clear 2012 Gustavo Lah)  . Menorrhagia    did not tolerate trial of ocps due to migraines  . Migraine headache   . Migraine syndrome    since age 44,  sporadic    Allergies  Allergen Reactions  . Propoxyphene     Other reaction(s): Other (See Comments) Unknown  . Tetracyclines & Related Diarrhea and Nausea Only         Observations/Objective:GENERAL APPEARANCE: Well developed, well nourished, alert and cooperative, and appears to be in no acute distress. Eye: Obvious swelling to the left upper lid with red bump centered.  Swelling extending into the left inner eye and around into the left lower lid.  She is able to open and close the eye.  Sclera appears white HEAD: normocephalic. Non labored breathing, no dyspnea or distress Skin: Skin normal color  PSYCHIATRIC: The mental examination revealed the patient was oriented to person, place, and time. The patient was able to demonstrate good judgement and reason, without hallucinations, abnormal affect or abnormal behaviors during the examination. Patient is not suicidal     Assessment and Plan: Most likely infected cyst to left upper lid that has worsened into  periorbital cellulitis. Will treat with oral antibiotics.  Instructed to keep a close watch on the eye over the next 24 to 48 hours and if symptoms significantly worsen she will need to be seen face to face.  Patient understanding and agrees    Follow Up Instructions: Follow up as needed for continued or worsening symptoms     I discussed the assessment and treatment plan with the patient. The patient was provided an opportunity to ask questions and all were answered. The patient agreed with the plan and demonstrated an understanding of the instructions.   The patient was advised to  call back or seek an in-person evaluation if the symptoms worsen or if the condition fails to improve as anticipated.    Orvan July, NP  09/28/2018 10:38 AM         Orvan July, NP 09/29/18 1611

## 2018-09-28 NOTE — Discharge Instructions (Signed)
Take the antibiotics as prescribed Use the warm compresses a few times a day for 10 to 15 minutes at a time.  Keep a close watch over the eye and if the symptoms worsen or do not improve over the next 48 hours please be seen in person.

## 2018-10-03 ENCOUNTER — Encounter: Payer: 59 | Admitting: Physical Therapy

## 2018-10-05 ENCOUNTER — Other Ambulatory Visit: Payer: Self-pay

## 2018-10-05 ENCOUNTER — Encounter: Payer: Self-pay | Admitting: Physical Therapy

## 2018-10-05 ENCOUNTER — Ambulatory Visit: Payer: 59 | Admitting: Physical Therapy

## 2018-10-05 DIAGNOSIS — Z7984 Long term (current) use of oral hypoglycemic drugs: Secondary | ICD-10-CM | POA: Diagnosis not present

## 2018-10-05 DIAGNOSIS — M25511 Pain in right shoulder: Secondary | ICD-10-CM | POA: Diagnosis not present

## 2018-10-05 DIAGNOSIS — G8929 Other chronic pain: Secondary | ICD-10-CM | POA: Diagnosis not present

## 2018-10-05 NOTE — Therapy (Signed)
[facility] [address] [place], [address] Phone: [phone]   Fax:  [phone]  Physical Therapy Treatment  Patient Details  Name: Stephanie Henderson MRN: [id number] Date of Birth: September 06, 1965 Referring Provider (PT): Derrel Nip MD   Encounter Date: 10/05/2018  PT End of Session - 10/05/18 1622    Visit Number  3    Number of Visits  17    Date for PT Re-Evaluation  11/14/18    PT Start Time  0330    PT Stop Time  0420    PT Time Calculation (min)  50 min    Activity Tolerance  Patient tolerated treatment well    Behavior During Therapy  [facility] for tasks assessed/performed       Past Medical History:  Diagnosis Date  . Fibroid   . GERD (gastroesophageal reflux disease) Aug 2012   with esophagitis by EGD  . Hematuria   . Hx of colonic polyps August 2012   repeat due 2016,  5 polyps 2009, clear 2012 Gustavo Lah)  . Menorrhagia    did not tolerate trial of ocps due to migraines  . Migraine headache   . Migraine syndrome    since age 60,  sporadic    Past Surgical History:  Procedure Laterality Date  . BREAST BIOPSY Right 2017   benign  . COLONOSCOPY WITH PROPOFOL N/A 07/31/2018   Procedure: COLONOSCOPY WITH PROPOFOL;  Surgeon: Lollie Sails, MD;  Location[facility];  Service: Endoscopy;  Laterality: N/A;  . CYSTOSCOPY    . ESOPHAGOGASTRODUODENOSCOPY (EGD) WITH PROPOFOL N/A 07/31/2018   Procedure: ESOPHAGOGASTRODUODENOSCOPY (EGD) WITH PROPOFOL;  Surgeon: Lollie Sails, MD;  Location: [facility] ENDOSCOPY;  Service: Endoscopy;  Laterality: N/A;  . laparoscopy    . VAGINAL HYSTERECTOMY  6/13   with morcellation    There were no vitals filed for this visit.  Subjective Assessment - 10/05/18 1534    Subjective  Patient reports decreased pain overall with 2/10 pain with abduction. Pt reports compliance with HEP.     Pertinent History  Patient is a 53 year old female with R shoulder pain that began  August of this year when she took something out of her pocket and had intense lateral shoulder pain with "tingling". Patient reports pain was intermittent through Sept, but in Oct became pretty constant. Patient reports pain is sharp at the lateral shoulder, with a dull ache in ant and post shoulder, and that she is unable to sleep on that side. Patient reports most pain with "bringing her arm out to the side, with pain with behind the back motions as well. Patient reports shearing pain at lateral shoulder, with dull ache in post and ant shoulder- occasssional hand "tingling" and forearm ache. Patient reported worst pain over the past week 8/10; best 3/10. Pt denies N/V, B&B, unexplained weight fluctuation, saddle paresthesia, fever, night sweats, or unrelenting night pain at this time.    Limitations  Lifting;House hold activities    How long can you sit comfortably?  unlimited    How long can you stand comfortably?  unlimited    How long can you walk comfortably?  unlimited    Diagnostic tests  none at this time    Pain Onset  More than a month ago         Ther-Ex -Seated military press 8# 3x 5 with min cuing for posture with good carry over following - Bent over reverse fly for  post delt activation 3x 8 2# DB with TC initially for proper form without hiking; good carry over following this and demo   - Supine rows 8# 3x 10 with cuing for maintained   Manual. - Multiple bouts of PROM into flex/abd/IR/ER (rotation at80d of abd) with10sec holds in max range of patient tolerance; increased difficulty with ER - STM with trigger point release to R UT and R lateral deltoid. GHJ grade III mobs 30sec x4; with ER x4, with abd x4, with flex x4, with IR x4 Traction 10sec traction 10sec relax x10 Patient with near full AROM in flex, ER, IR, approx 90d abd; PROM: near full flex, approx 120d abd, approx 50d ER, 70d IR   ESTIM+ heat packHiVolt ESTIM46min at patient tolerated135Vincreased  to160V through treatmentatR UT and middle deltoidarea. Attempted d/t success of treatment at previous session success. With PT assessing patient tolerance throughout (increasing intensity as needed), monitoring skin integrity (normal), with decreased pain noted from patientfollowing andless pain with PROM following.                        PT Education - 10/05/18 1621    Education Details  exercise form    Person(s) Educated  Patient    Methods  Explanation;Demonstration;Tactile cues;Verbal cues    Comprehension  Verbalized understanding;Returned demonstration;Verbal cues required;Tactile cues required       PT Short Term Goals - 09/19/18 1552      PT SHORT TERM GOAL #1   Title  Pt will be independent with HEP in order to improve strength and balance in order to decrease fall risk and improve function at home and work    Time  4    Period  Weeks    Status  New        PT Long Term Goals - 09/19/18 1529      PT LONG TERM GOAL #1   Title  Patient will increase FOTO score to 71 to demonstrate predicted increase in functional mobility to complete ADLs    Baseline  09/19/18 62    Time  8    Period  Weeks    Status  Revised      PT LONG TERM GOAL #2   Title  Patient will demonstrate symmetrical, full gross shoulder AROM in order to return to PLOF and complete ADLs    Baseline  09/19/18: L wnl all motions R: flex 120d ; abd 91d ; IR T10 ; ER CTJ    Time  8    Period  Weeks    Status  Revised      PT LONG TERM GOAL #3   Title  Patient will demonstrate gross shoulder strength 4+/5 or better to return to PLOF, and to be able to safely complete     Baseline  09/19/18 R ER 5/5 ; IR 5/5 flex 4+/5 ; abd 4/5    Time  8    Period  Weeks    Status  Revised      PT LONG TERM GOAL #4   Title  Pt will decrease worst pain as reported on NPRS by at least 3 points in order to demonstrate clinically significant reduction in pain.    Baseline  5/520 2/10    Time  8     Period  Weeks    Status  Achieved            Plan - 10/05/18 1644    Clinical Impression  Statement  Patient is demonstrating increased motion this session, and a better tolerance for manual techniques. Patient is able to complete progression of therex with proper form following demo and cuing from therex with muscle fatigue, and mild increase in pain that does not linger when therex subsides. PT will continue progression as able.     Personal Factors and Comorbidities  Past/Current Experience;Fitness;Sex;Time since onset of injury/illness/exacerbation    Examination-Activity Limitations  Carry;Reach Overhead;Caring for Others;Lift    Examination-Participation Restrictions  Cleaning;Laundry;Community Activity;Driving    Stability/Clinical Decision Making  Evolving/Moderate complexity    Rehab Potential  Good    Clinical Impairments Affecting Rehab Potential  (+) fairly yong age, motivation (-) Sedentary lifestyle, chronicity of pain, hypersensitivity, psycho-social factors    PT Frequency  2x / week    PT Duration  8 weeks    PT Treatment/Interventions  Moist Heat;Traction;Ultrasound;Cryotherapy;Fluidtherapy;Electrical Stimulation;Therapeutic exercise;Taping;Patient/family education;Therapeutic activities;Functional mobility training;Neuromuscular re-education;Passive range of motion;Dry needling;Manual techniques    PT Next Visit Plan  If pt agreeable shift toward strengthening in available range.    PT Home Exercise Plan  Supine pec stretch, supine IR stretch, pulleys AAROM flex/abd, scapular retractions    Consulted and Agree with Plan of Care  Patient       Patient will benefit from skilled therapeutic intervention in order to improve the following deficits and impairments:  Improper body mechanics, Pain, Postural dysfunction, Impaired tone, Decreased activity tolerance, Decreased endurance, Decreased range of motion, Decreased strength, Impaired UE functional use  Visit  Diagnosis: Chronic right shoulder pain     Problem List Patient Active Problem List   Diagnosis Date Noted  . Reflux esophagitis 08/02/2018  . Right shoulder pain 07/13/2018  . Frozen shoulder syndrome 06/13/2018  . Encounter for preventative adult health care exam with abnormal findings 05/27/2018  . Raynaud phenomenon 05/28/2017  . Generalized anxiety disorder 05/29/2016  . S/P hysterectomy 05/29/2016  . Headache 04/14/2016  . History of mammogram 03/02/2011  . Migraine syndrome   . Hx of colonic polyps   . GERD (gastroesophageal reflux disease) 12/16/2010   Shelton Silvas PT, DPT Shelton Silvas 10/05/2018, 4:47 PM  [facility] [address], [place], [address] Phone: [phone]   Fax:  [phone]  Name: Stephanie Henderson MRN: [id number] Date of Birth: June 08, 1965

## 2018-10-10 ENCOUNTER — Ambulatory Visit: Payer: 59 | Admitting: Physical Therapy

## 2018-10-11 ENCOUNTER — Other Ambulatory Visit: Payer: Self-pay

## 2018-10-11 ENCOUNTER — Ambulatory Visit: Payer: 59 | Admitting: Physical Therapy

## 2018-10-11 ENCOUNTER — Encounter: Payer: Self-pay | Admitting: Physical Therapy

## 2018-10-11 DIAGNOSIS — M25511 Pain in right shoulder: Secondary | ICD-10-CM | POA: Diagnosis not present

## 2018-10-11 DIAGNOSIS — G8929 Other chronic pain: Secondary | ICD-10-CM | POA: Diagnosis not present

## 2018-10-11 DIAGNOSIS — Z7984 Long term (current) use of oral hypoglycemic drugs: Secondary | ICD-10-CM | POA: Diagnosis not present

## 2018-10-11 NOTE — Therapy (Signed)
[facility] [address] [place], [address] Phone: [phone]   Fax:  [phone]  Physical Therapy Treatment  Patient Details  Name: Stephanie Henderson MRN: [id number] Date of Birth: [birth year] Referring Provider (PT): Derrel Nip MD   Encounter Date: 10/11/2018  PT End of Session - 10/11/18 1608    Visit Number  4    Number of Visits  17    Date for PT Re-Evaluation  11/14/18    PT Start Time  0330    PT Stop Time  0415    PT Time Calculation (min)  45 min    Activity Tolerance  Patient tolerated treatment well    Behavior During Therapy  [facility] for tasks assessed/performed       Past Medical History:  Diagnosis Date  . Fibroid   . GERD (gastroesophageal reflux disease) Aug 2012   with esophagitis by EGD  . Hematuria   . Hx of colonic polyps August 2012   repeat due 2016,  5 polyps 2009, clear 2012 Gustavo Lah)  . Menorrhagia    did not tolerate trial of ocps due to migraines  . Migraine headache   . Migraine syndrome    since age 89,  sporadic    Past Surgical History:  Procedure Laterality Date  . BREAST BIOPSY Right 2017   benign  . COLONOSCOPY WITH PROPOFOL N/A 07/31/2018   Procedure: COLONOSCOPY WITH PROPOFOL;  Surgeon: Lollie Sails, MD;  Location: [facility] ENDOSCOPY;  Service: Endoscopy;  Laterality: N/A;  . CYSTOSCOPY    . ESOPHAGOGASTRODUODENOSCOPY (EGD) WITH PROPOFOL N/A 07/31/2018   Procedure: ESOPHAGOGASTRODUODENOSCOPY (EGD) WITH PROPOFOL;  Surgeon: Lollie Sails, MD;  Location: [facility] ENDOSCOPY;  Service: Endoscopy;  Laterality: N/A;  . laparoscopy    . VAGINAL HYSTERECTOMY  6/13   with morcellation    There were no vitals filed for this visit.  Subjective Assessment - 10/11/18 1534    Subjective  Patient reports some muscle soreness, mostly following increased weight training. Patient reports compliance with HEP with no questions or concerns. Continues to report 2/10 pain with abd  and improving ROM.     Pertinent History  Patient is a 53 year old female with R shoulder pain that began August of this year when she took something out of her pocket and had intense lateral shoulder pain with "tingling". Patient reports pain was intermittent through Sept, but in Oct became pretty constant. Patient reports pain is sharp at the lateral shoulder, with a dull ache in ant and post shoulder, and that she is unable to sleep on that side. Patient reports most pain with "bringing her arm out to the side, with pain with behind the back motions as well. Patient reports shearing pain at lateral shoulder, with dull ache in post and ant shoulder- occasssional hand "tingling" and forearm ache. Patient reported worst pain over the past week 8/10; best 3/10. Pt denies N/V, B&B, unexplained weight fluctuation, saddle paresthesia, fever, night sweats, or unrelenting night pain at this time.    Limitations  Lifting;House hold activities    How long can you sit comfortably?  unlimited    How long can you stand comfortably?  unlimited    How long can you walk comfortably?  unlimited    Diagnostic tests  none at this time    Pain Onset  More than a month ago       Ther-Ex -Lat pull down 25# 3x 10 with TC to  reduce shoulder hiking - Reverse fly RTB 3x 10 with TC initially for proper form with decent carry over, some UT compensation and scapular winging - Serratus punch in supine 3# x10; 4# 2x 10 with min cuing initially for technique, good carry over - Serratus push up 3x 10 with max TC initially for proper technique without thoracic compensation with good carry over following - Prone snow angels to 90-100d 3x 8 with cuing for maintained scapular retraction without shoulder hiking decent carry over   Manual. - Multiple bouts of PROM into flex/abd/IR/ER (rotation at80d of abd) with10sec holds in max range of patient tolerance; increased difficulty with ER -STM withtrigger point releaseto R UT  and R lateral deltoid. GHJ grade III mobs 30sec x4; with ER x4, with abd x4, with flex x4, with IR x4 Traction 10sec traction 10sec relax x10                         PT Education - 10/11/18 1608    Education Details  Exercise form    Person(s) Educated  Patient    Methods  Explanation;Demonstration;Tactile cues;Verbal cues    Comprehension  Verbalized understanding;Returned demonstration;Verbal cues required;Tactile cues required       PT Short Term Goals - 09/19/18 1552      PT SHORT TERM GOAL #1   Title  Pt will be independent with HEP in order to improve strength and balance in order to decrease fall risk and improve function at home and work    Time  4    Period  Weeks    Status  New        PT Long Term Goals - 09/19/18 1529      PT LONG TERM GOAL #1   Title  Patient will increase FOTO score to 71 to demonstrate predicted increase in functional mobility to complete ADLs    Baseline  09/19/18 62    Time  8    Period  Weeks    Status  Revised      PT LONG TERM GOAL #2   Title  Patient will demonstrate symmetrical, full gross shoulder AROM in order to return to PLOF and complete ADLs    Baseline  09/19/18: L wnl all motions R: flex 120d ; abd 91d ; IR T10 ; ER CTJ    Time  8    Period  Weeks    Status  Revised      PT LONG TERM GOAL #3   Title  Patient will demonstrate gross shoulder strength 4+/5 or better to return to PLOF, and to be able to safely complete     Baseline  09/19/18 R ER 5/5 ; IR 5/5 flex 4+/5 ; abd 4/5    Time  8    Period  Weeks    Status  Revised      PT LONG TERM GOAL #4   Title  Pt will decrease worst pain as reported on NPRS by at least 3 points in order to demonstrate clinically significant reduction in pain.    Baseline  5/520 2/10    Time  8    Period  Weeks    Status  Achieved            Plan - 10/11/18 1614    Clinical Impression Statement  PT continued therex progression for shoulder girdle with emphasis on  shoulder depression, and motor control patterns. Patient is abel to complete therex with  accuracy following demo and cuing fairly well. PT forewent modalities this session, to gauge patient pain response without. patient continues to respond well to manual techniques with increased motion and decreased muscle tension following. PT expressed the importance of a stretching regimen to prevent excessive post exercise soreness which patient verbalized understanding of. PT will continue progression as able.     Personal Factors and Comorbidities  Past/Current Experience;Fitness;Sex;Time since onset of injury/illness/exacerbation    Examination-Activity Limitations  Carry;Reach Overhead;Caring for Others;Lift    Examination-Participation Restrictions  Cleaning;Laundry;Community Activity;Driving    Stability/Clinical Decision Making  Evolving/Moderate complexity    Rehab Potential  Good    Clinical Impairments Affecting Rehab Potential  (+) fairly yong age, motivation (-) Sedentary lifestyle, chronicity of pain, hypersensitivity, psycho-social factors    PT Frequency  2x / week    PT Duration  8 weeks    PT Treatment/Interventions  Moist Heat;Traction;Ultrasound;Cryotherapy;Fluidtherapy;Electrical Stimulation;Therapeutic exercise;Taping;Patient/family education;Therapeutic activities;Functional mobility training;Neuromuscular re-education;Passive range of motion;Dry needling;Manual techniques    PT Next Visit Plan  If pt agreeable shift toward strengthening in available range.    PT Home Exercise Plan  Supine pec stretch, supine IR stretch, pulleys AAROM flex/abd, scapular retractions    Consulted and Agree with Plan of Care  Patient       Patient will benefit from skilled therapeutic intervention in order to improve the following deficits and impairments:  Improper body mechanics, Pain, Postural dysfunction, Impaired tone, Decreased activity tolerance, Decreased endurance, Decreased range of motion,  Decreased strength, Impaired UE functional use  Visit Diagnosis: Chronic right shoulder pain     Problem List Patient Active Problem List   Diagnosis Date Noted  . Reflux esophagitis 08/02/2018  . Right shoulder pain 07/13/2018  . Frozen shoulder syndrome 06/13/2018  . Encounter for preventative adult health care exam with abnormal findings 05/27/2018  . Raynaud phenomenon 05/28/2017  . Generalized anxiety disorder 05/29/2016  . S/P hysterectomy 05/29/2016  . Headache 04/14/2016  . History of mammogram 03/02/2011  . Migraine syndrome   . Hx of colonic polyps   . GERD (gastroesophageal reflux disease) 12/16/2010   Shelton Silvas PT, DPT Shelton Silvas 10/11/2018, 4:26 PM  [facility] [address] [place], [address] Phone: [phone]   Fax:  [phone]  Name: Stephanie Henderson MRN: [id number] Date of Birth: Jul 08, 1965

## 2018-10-12 ENCOUNTER — Encounter: Payer: 59 | Admitting: Physical Therapy

## 2018-10-18 ENCOUNTER — Ambulatory Visit: Payer: 59 | Attending: Internal Medicine | Admitting: Physical Therapy

## 2018-10-18 ENCOUNTER — Encounter: Payer: Self-pay | Admitting: Physical Therapy

## 2018-10-18 ENCOUNTER — Other Ambulatory Visit: Payer: Self-pay

## 2018-10-18 DIAGNOSIS — R293 Abnormal posture: Secondary | ICD-10-CM | POA: Insufficient documentation

## 2018-10-18 DIAGNOSIS — G8929 Other chronic pain: Secondary | ICD-10-CM | POA: Insufficient documentation

## 2018-10-18 DIAGNOSIS — M25511 Pain in right shoulder: Secondary | ICD-10-CM | POA: Insufficient documentation

## 2018-10-18 NOTE — Therapy (Signed)
[facility] [address], [place], [address] Phone: [phone]   Fax:  [phone]  Physical Therapy Treatment  Patient Details  Name: Stephanie Henderson MRN: [id number] Date of Birth: 1965/05/26 Referring Provider (PT): Derrel Nip MD   Encounter Date: 10/18/2018  PT End of Session - 10/18/18 1657    Visit Number  5    Number of Visits  17    Date for PT Re-Evaluation  11/14/18    PT Start Time  0430    PT Stop Time  0515    PT Time Calculation (min)  45 min    Activity Tolerance  Patient tolerated treatment well    Behavior During Therapy  [facility] for tasks assessed/performed       Past Medical History:  Diagnosis Date  . Fibroid   . GERD (gastroesophageal reflux disease) Aug 2012   with esophagitis by EGD  . Hematuria   . Hx of colonic polyps August 2012   repeat due 2016,  5 polyps 2009, clear 2012 Gustavo Lah)  . Menorrhagia    did not tolerate trial of ocps due to migraines  . Migraine headache   . Migraine syndrome    since age 66,  sporadic    Past Surgical History:  Procedure Laterality Date  . BREAST BIOPSY Right 2017   benign  . COLONOSCOPY WITH PROPOFOL N/A 07/31/2018   Procedure: COLONOSCOPY WITH PROPOFOL;  Surgeon: Lollie Sails, MD;  Location: [facility] ENDOSCOPY;  Service: Endoscopy;  Laterality: N/A;  . CYSTOSCOPY    . ESOPHAGOGASTRODUODENOSCOPY (EGD) WITH PROPOFOL N/A 07/31/2018   Procedure: ESOPHAGOGASTRODUODENOSCOPY (EGD) WITH PROPOFOL;  Surgeon: Lollie Sails, MD;  Location: [facility] ENDOSCOPY;  Service: Endoscopy;  Laterality: N/A;  . laparoscopy    . VAGINAL HYSTERECTOMY  6/13   with morcellation    There were no vitals filed for this visit.  Subjective Assessment - 10/18/18 1634    Subjective  Reports 1/10 pain today, motion that is mostly the same. Patient reports some compliance with HEP.     Pertinent History  Patient is a 53 year old female with R shoulder pain that began  August of this year when she took something out of her pocket and had intense lateral shoulder pain with "tingling". Patient reports pain was intermittent through Sept, but in Oct became pretty constant. Patient reports pain is sharp at the lateral shoulder, with a dull ache in ant and post shoulder, and that she is unable to sleep on that side. Patient reports most pain with "bringing her arm out to the side, with pain with behind the back motions as well. Patient reports shearing pain at lateral shoulder, with dull ache in post and ant shoulder- occasssional hand "tingling" and forearm ache. Patient reported worst pain over the past week 8/10; best 3/10. Pt denies N/V, B&B, unexplained weight fluctuation, saddle paresthesia, fever, night sweats, or unrelenting night pain at this time.    Limitations  Lifting;House hold activities    How long can you stand comfortably?  unlimited    How long can you walk comfortably?  unlimited    Diagnostic tests  none at this time    Pain Onset  More than a month ago       Ther-Ex - Wall slides x10; with RTB 2x 10 with TC initially for maintained scapular retraction with good carry over following - Serratus push up 3x 10 with min cuing for proper form initially with  good carry over following - DB pullovers 10# 3x 12/24/08 with cuing to maintain scapular contact with mat table - Serratus punch with RTB 3x 10 with cuing initially for proper form with good carry over following - Prone snow angels to 90-100d 3x 8 with cuing for maintained scapular retraction without shoulder hiking with good carry over   Manual. - Multiple bouts of PROM into flex/abd/IR/ER (rotation at80d of abd) with10sec holds in max range of patient tolerance; increased difficulty with ER -STM withtrigger point releaseto R UT and R lateral deltoid. GHJ grade III mobs 30sec x4; with ER x4, with abd x4, with flex x4, with IR x4 Traction 10sec traction 10sec relax  x10                        PT Education - 10/18/18 1656    Education Details  Exercise form    Person(s) Educated  Patient    Methods  Explanation;Demonstration;Tactile cues;Verbal cues    Comprehension  Verbalized understanding;Returned demonstration;Verbal cues required;Tactile cues required       PT Short Term Goals - 09/19/18 1552      PT SHORT TERM GOAL #1   Title  Pt will be independent with HEP in order to improve strength and balance in order to decrease fall risk and improve function at home and work    Time  4    Period  Weeks    Status  New        PT Long Term Goals - 09/19/18 1529      PT LONG TERM GOAL #1   Title  Patient will increase FOTO score to 71 to demonstrate predicted increase in functional mobility to complete ADLs    Baseline  09/19/18 62    Time  8    Period  Weeks    Status  Revised      PT LONG TERM GOAL #2   Title  Patient will demonstrate symmetrical, full gross shoulder AROM in order to return to PLOF and complete ADLs    Baseline  09/19/18: L wnl all motions R: flex 120d ; abd 91d ; IR T10 ; ER CTJ    Time  8    Period  Weeks    Status  Revised      PT LONG TERM GOAL #3   Title  Patient will demonstrate gross shoulder strength 4+/5 or better to return to PLOF, and to be able to safely complete     Baseline  09/19/18 R ER 5/5 ; IR 5/5 flex 4+/5 ; abd 4/5    Time  8    Period  Weeks    Status  Revised      PT LONG TERM GOAL #4   Title  Pt will decrease worst pain as reported on NPRS by at least 3 points in order to demonstrate clinically significant reduction in pain.    Baseline  5/520 2/10    Time  8    Period  Weeks    Status  Achieved            Plan - 10/18/18 1712    Clinical Impression Statement  PT continued therex progression with continued focus on shoulder depression and muscle activation to reduce scapular winging. Patinet is able to complete therex with proper form following cuing from PT. Pt is  continuing to respond well to manual techniques with increased ROM following    Personal Factors and Comorbidities  Past/Current Experience;Fitness;Sex;Time since onset of injury/illness/exacerbation    Examination-Activity Limitations  Carry;Reach Overhead;Caring for Others;Lift    Examination-Participation Restrictions  Cleaning;Laundry;Community Activity;Driving    Stability/Clinical Decision Making  Evolving/Moderate complexity    Rehab Potential  Good    Clinical Impairments Affecting Rehab Potential  (+) fairly yong age, motivation (-) Sedentary lifestyle, chronicity of pain, hypersensitivity, psycho-social factors    PT Frequency  2x / week    PT Duration  8 weeks    PT Treatment/Interventions  Moist Heat;Traction;Ultrasound;Cryotherapy;Fluidtherapy;Electrical Stimulation;Therapeutic exercise;Taping;Patient/family education;Therapeutic activities;Functional mobility training;Neuromuscular re-education;Passive range of motion;Dry needling;Manual techniques    PT Next Visit Plan  If pt agreeable shift toward strengthening in available range.    PT Home Exercise Plan  Supine pec stretch, supine IR stretch, pulleys AAROM flex/abd, scapular retractions    Consulted and Agree with Plan of Care  Patient       Patient will benefit from skilled therapeutic intervention in order to improve the following deficits and impairments:  Improper body mechanics, Pain, Postural dysfunction, Impaired tone, Decreased activity tolerance, Decreased endurance, Decreased range of motion, Decreased strength, Impaired UE functional use  Visit Diagnosis: Chronic right shoulder pain  Acute pain of right shoulder  Abnormal posture     Problem List Patient Active Problem List   Diagnosis Date Noted  . Reflux esophagitis 08/02/2018  . Right shoulder pain 07/13/2018  . Frozen shoulder syndrome 06/13/2018  . Encounter for preventative adult health care exam with abnormal findings 05/27/2018  . Raynaud  phenomenon 05/28/2017  . Generalized anxiety disorder 05/29/2016  . S/P hysterectomy 05/29/2016  . Headache 04/14/2016  . History of mammogram 03/02/2011  . Migraine syndrome   . Hx of colonic polyps   . GERD (gastroesophageal reflux disease) 12/16/2010   Shelton Silvas PT, DPT Shelton Silvas 10/18/2018, 5:26 PM  [facility] [address] [place], [address] Phone: [phone]   Fax:  [phone]  Name: Stephanie Henderson MRN: [id number] Date of Birth: 1965-10-16

## 2018-10-25 ENCOUNTER — Ambulatory Visit: Payer: 59 | Admitting: Physical Therapy

## 2018-11-01 ENCOUNTER — Other Ambulatory Visit: Payer: Self-pay

## 2018-11-01 ENCOUNTER — Ambulatory Visit: Payer: 59 | Admitting: Physical Therapy

## 2018-11-01 ENCOUNTER — Encounter: Payer: Self-pay | Admitting: Physical Therapy

## 2018-11-01 DIAGNOSIS — G8929 Other chronic pain: Secondary | ICD-10-CM

## 2018-11-01 DIAGNOSIS — R293 Abnormal posture: Secondary | ICD-10-CM | POA: Diagnosis not present

## 2018-11-01 DIAGNOSIS — M25511 Pain in right shoulder: Secondary | ICD-10-CM

## 2018-11-01 NOTE — Therapy (Signed)
[facility] [address] [place], [address] Phone: [phone]   Fax:  [phone]  Physical Therapy Treatment  Patient Details  Name: Stephanie Henderson MRN: [id number] Date of Birth: 26-Jul-1965 Referring Provider (PT): Derrel Nip MD   Encounter Date: 11/01/2018  PT End of Session - 11/01/18 1657    Visit Number  6    Number of Visits  17    Date for PT Re-Evaluation  11/14/18    PT Start Time  0430    PT Stop Time  0515    PT Time Calculation (min)  45 min       Past Medical History:  Diagnosis Date  . Fibroid   . GERD (gastroesophageal reflux disease) Aug 2012   with esophagitis by EGD  . Hematuria   . Hx of colonic polyps August 2012   repeat due 2016,  5 polyps 2009, clear 2012 Gustavo Lah)  . Menorrhagia    did not tolerate trial of ocps due to migraines  . Migraine headache   . Migraine syndrome    since age 40,  sporadic    Past Surgical History:  Procedure Laterality Date  . BREAST BIOPSY Right 2017   benign  . COLONOSCOPY WITH PROPOFOL N/A 07/31/2018   Procedure: COLONOSCOPY WITH PROPOFOL;  Surgeon: Lollie Sails, MD;  Location: [facility] ENDOSCOPY;  Service: Endoscopy;  Laterality: N/A;  . CYSTOSCOPY    . ESOPHAGOGASTRODUODENOSCOPY (EGD) WITH PROPOFOL N/A 07/31/2018   Procedure: ESOPHAGOGASTRODUODENOSCOPY (EGD) WITH PROPOFOL;  Surgeon: Lollie Sails, MD;  Location: [facility] ENDOSCOPY;  Service: Endoscopy;  Laterality: N/A;  . laparoscopy    . VAGINAL HYSTERECTOMY  6/13   with morcellation    There were no vitals filed for this visit.  Subjective Assessment - 11/01/18 1637    Subjective  Patient reports 2/10 pain. Increased motion in all directions with most limitation in abd.    Pertinent History  Patient is a 53 year old female with R shoulder pain that began August of this year when she took something out of her pocket and had intense lateral shoulder pain with "tingling". Patient  reports pain was intermittent through Sept, but in Oct became pretty constant. Patient reports pain is sharp at the lateral shoulder, with a dull ache in ant and post shoulder, and that she is unable to sleep on that side. Patient reports most pain with "bringing her arm out to the side, with pain with behind the back motions as well. Patient reports shearing pain at lateral shoulder, with dull ache in post and ant shoulder- occasssional hand "tingling" and forearm ache. Patient reported worst pain over the past week 8/10; best 3/10. Pt denies N/V, B&B, unexplained weight fluctuation, saddle paresthesia, fever, night sweats, or unrelenting night pain at this time.    Limitations  Lifting;House hold activities    How long can you sit comfortably?  unlimited    How long can you stand comfortably?  unlimited    How long can you walk comfortably?  unlimited    Diagnostic tests  none at this time    Pain Onset  More than a month ago         Ther-Ex - DB pullovers 10# 3x 10 with cuing to maintain scapular contact with mat table - Bent over rows 10# x10; 15# 2x 10 with cuing for initial set up and demonstration with good carry over, needing some cuing throughout first set - TRX pull  ups 3x 10 with demo and max cuing for initial form with good carry over following   Manual. - Multiple bouts of PROM into flex/abd/IR/ER (rotation at80d of abd) with10sec holds in max range of patient tolerance; increased difficulty with ER -STM withtrigger point releaseto R UT and R lateral deltoid Dry Needling: (1) 24mm .25 needles placed along the R lateral deltoid with pincer grasp to decrease increased muscular spasms and trigger points with the patient positioned in supine. Patient was educated on risks and benefits of therapy and verbally consents to PT - GHJ grade III mobs 30sec x4; with ER x4, with abd x4, with flex x4, with IR x4 Traction 10sec traction 10sec relax  x10                       PT Education - 11/01/18 1656    Education Details  Exercise form    Person(s) Educated  Patient    Methods  Explanation;Demonstration;Tactile cues;Verbal cues    Comprehension  Verbalized understanding;Returned demonstration;Verbal cues required;Tactile cues required       PT Short Term Goals - 09/19/18 1552      PT SHORT TERM GOAL #1   Title  Pt will be independent with HEP in order to improve strength and balance in order to decrease fall risk and improve function at home and work    Time  4    Period  Weeks    Status  New        PT Long Term Goals - 09/19/18 1529      PT LONG TERM GOAL #1   Title  Patient will increase FOTO score to 71 to demonstrate predicted increase in functional mobility to complete ADLs    Baseline  09/19/18 62    Time  8    Period  Weeks    Status  Revised      PT LONG TERM GOAL #2   Title  Patient will demonstrate symmetrical, full gross shoulder AROM in order to return to PLOF and complete ADLs    Baseline  09/19/18: L wnl all motions R: flex 120d ; abd 91d ; IR T10 ; ER CTJ    Time  8    Period  Weeks    Status  Revised      PT LONG TERM GOAL #3   Title  Patient will demonstrate gross shoulder strength 4+/5 or better to return to PLOF, and to be able to safely complete     Baseline  09/19/18 R ER 5/5 ; IR 5/5 flex 4+/5 ; abd 4/5    Time  8    Period  Weeks    Status  Revised      PT LONG TERM GOAL #4   Title  Pt will decrease worst pain as reported on NPRS by at least 3 points in order to demonstrate clinically significant reduction in pain.    Baseline  5/520 2/10    Time  8    Period  Weeks    Status  Achieved            Plan - 11/02/18 1028    Clinical Impression Statement  PT continued therex progression following manual techniques to increase ROM> Patient continues to respond well to manual and TDN techniques, to allow for therex progression, which patient is able complete with  accuracy following some cuing and demonstration. PT will continue progression as able.    Personal Factors and Comorbidities  Past/Current Experience;Fitness;Sex;Time since onset of injury/illness/exacerbation    Examination-Activity Limitations  Carry;Reach Overhead;Caring for Others;Lift    Examination-Participation Restrictions  Cleaning;Laundry;Community Activity;Driving    Stability/Clinical Decision Making  Evolving/Moderate complexity    Clinical Decision Making  Moderate    Clinical Impairments Affecting Rehab Potential  (+) fairly yong age, motivation (-) Sedentary lifestyle, chronicity of pain, hypersensitivity, psycho-social factors    PT Frequency  2x / week    PT Duration  8 weeks    PT Treatment/Interventions  Moist Heat;Traction;Ultrasound;Cryotherapy;Fluidtherapy;Electrical Stimulation;Therapeutic exercise;Taping;Patient/family education;Therapeutic activities;Functional mobility training;Neuromuscular re-education;Passive range of motion;Dry needling;Manual techniques    PT Next Visit Plan  If pt agreeable shift toward strengthening in available range.    PT Home Exercise Plan  Supine pec stretch, supine IR stretch, pulleys AAROM flex/abd, scapular retractions    Consulted and Agree with Plan of Care  Patient       Patient will benefit from skilled therapeutic intervention in order to improve the following deficits and impairments:  Improper body mechanics, Pain, Postural dysfunction, Impaired tone, Decreased activity tolerance, Decreased endurance, Decreased range of motion, Decreased strength, Impaired UE functional use  Visit Diagnosis: 1. Chronic right shoulder pain   2. Acute pain of right shoulder   3. Abnormal posture        Problem List Patient Active Problem List   Diagnosis Date Noted  . Reflux esophagitis 08/02/2018  . Right shoulder pain 07/13/2018  . Frozen shoulder syndrome 06/13/2018  . Encounter for preventative adult health care exam with abnormal  findings 05/27/2018  . Raynaud phenomenon 05/28/2017  . Generalized anxiety disorder 05/29/2016  . S/P hysterectomy 05/29/2016  . Headache 04/14/2016  . History of mammogram 03/02/2011  . Migraine syndrome   . Hx of colonic polyps   . GERD (gastroesophageal reflux disease) 12/16/2010   Shelton Silvas PT, DPT Shelton Silvas 11/02/2018, 10:37 AM  [facility] [address] [place], [address] Phone: [phone]   Fax:  [phone]  Name: Stephanie Henderson MRN: [id number] Date of Birth: March 17, 1966

## 2018-11-02 DIAGNOSIS — H524 Presbyopia: Secondary | ICD-10-CM | POA: Diagnosis not present

## 2018-11-02 DIAGNOSIS — Z135 Encounter for screening for eye and ear disorders: Secondary | ICD-10-CM | POA: Diagnosis not present

## 2018-11-08 ENCOUNTER — Ambulatory Visit: Payer: 59 | Admitting: Physical Therapy

## 2018-11-15 ENCOUNTER — Ambulatory Visit: Payer: 59 | Attending: Internal Medicine | Admitting: Physical Therapy

## 2018-11-15 ENCOUNTER — Other Ambulatory Visit: Payer: Self-pay

## 2018-11-15 ENCOUNTER — Encounter: Payer: Self-pay | Admitting: Physical Therapy

## 2018-11-15 DIAGNOSIS — R293 Abnormal posture: Secondary | ICD-10-CM | POA: Insufficient documentation

## 2018-11-15 DIAGNOSIS — G8929 Other chronic pain: Secondary | ICD-10-CM | POA: Diagnosis not present

## 2018-11-15 DIAGNOSIS — M25511 Pain in right shoulder: Secondary | ICD-10-CM | POA: Diagnosis not present

## 2018-11-15 NOTE — Therapy (Signed)
[facility] [address] [place], [address] Phone: [phone]   Fax:  [phone]  Physical Therapy Treatment  Patient Details  Name: Stephanie Henderson MRN: [id number] Date of Birth: [birth year] Referring Provider (PT): Derrel Nip MD   Encounter Date: 11/15/2018    Past Medical History:  Diagnosis Date  . Fibroid   . GERD (gastroesophageal reflux disease) Aug 2012   with esophagitis by EGD  . Hematuria   . Hx of colonic polyps August 2012   repeat due 2016,  5 polyps 2009, clear 2012 Gustavo Lah)  . Menorrhagia    did not tolerate trial of ocps due to migraines  . Migraine headache   . Migraine syndrome    since age 76,  sporadic    Past Surgical History:  Procedure Laterality Date  . BREAST BIOPSY Right 2017   benign  . COLONOSCOPY WITH PROPOFOL N/A 07/31/2018   Procedure: COLONOSCOPY WITH PROPOFOL;  Surgeon: Lollie Sails, MD;  Location: [facility] ENDOSCOPY;  Service: Endoscopy;  Laterality: N/A;  . CYSTOSCOPY    . ESOPHAGOGASTRODUODENOSCOPY (EGD) WITH PROPOFOL N/A 07/31/2018   Procedure: ESOPHAGOGASTRODUODENOSCOPY (EGD) WITH PROPOFOL;  Surgeon: Lollie Sails, MD;  Location: [facility] ENDOSCOPY;  Service: Endoscopy;  Laterality: N/A;  . laparoscopy    . VAGINAL HYSTERECTOMY  6/13   with morcellation    There were no vitals filed for this visit.  Subjective Assessment - 11/15/18 1533    Subjective  Pt reports her motion and pain is much better. Very minimal pain at end range motion. Minimal compliance with HEP d/t being on vacation.       Ther-Ex - DB pullovers 10# 3x 10 with good carry over from previous session - Shoulder adduction OMEGA 5# with cuing for eccentric control for abd phase 125d measured  - Wall slides with GTB 3x 10 with min cuing to prevent shoulder hiking with good carry over following - Lat pulldown 25# 3x 10 with good carry over of proper form from prior sessions - Overhead press  10# x5; 7# 2x 8 with min cuing of proper form, without shoulder hiking, with good carry over following  Manual. - Multiple bouts of PROM into flex/abd/IR/ER (rotation at80d of abd) with10sec holds in max range of patient tolerance; increased difficulty with ER -STM withtrigger point releaseto R UT and R lateral deltoid                          PT Education - 11/15/18 1540    Education Details  Exercise form; HEP review    Person(s) Educated  Patient    Methods  Explanation;Demonstration;Tactile cues;Verbal cues;Handout    Comprehension  Verbalized understanding;Returned demonstration;Verbal cues required;Tactile cues required       PT Short Term Goals - 11/15/18 1534      PT SHORT TERM GOAL #1   Title  Pt will be independent with HEP in order to improve strength and balance in order to decrease fall risk and improve function at home and work    Baseline  Completes HEP independently    Time  4    Period  Weeks    Status  New        PT Long Term Goals - 11/15/18 1535      PT LONG TERM GOAL #1   Title  Patient will increase FOTO score to 71 to demonstrate predicted increase in functional mobility to complete ADLs  Baseline  09/19/18 62    Time  8    Period  Weeks    Status  Revised      PT LONG TERM GOAL #2   Title  Patient will demonstrate symmetrical, full gross shoulder AROM in order to return to PLOF and complete ADLs    Baseline  09/19/18: L wnl all motions R: flex 134d ; abd 101d ; IR T9 ; ER CTJ    Time  8    Period  Weeks    Status  Revised      PT LONG TERM GOAL #3   Title  Patient will demonstrate gross shoulder strength 4+/5 or better to return to PLOF, and to be able to safely complete     Baseline  Flex 5/5 Abd 4+/5 IR 5/5 ER 5/5    Time  8    Period  Weeks    Status  Revised      PT LONG TERM GOAL #4   Title  Pt will decrease worst pain as reported on NPRS by at least 3 points in order to demonstrate clinically significant  reduction in pain.    Baseline  11/15/18 3/10 with strenuous activity    Time  8    Period  Weeks    Status  Achieved              Patient will benefit from skilled therapeutic intervention in order to improve the following deficits and impairments:     Visit Diagnosis: 1. Chronic right shoulder pain   2. Acute pain of right shoulder   3. Abnormal posture        Problem List Patient Active Problem List   Diagnosis Date Noted  . Reflux esophagitis 08/02/2018  . Right shoulder pain 07/13/2018  . Frozen shoulder syndrome 06/13/2018  . Encounter for preventative adult health care exam with abnormal findings 05/27/2018  . Raynaud phenomenon 05/28/2017  . Generalized anxiety disorder 05/29/2016  . S/P hysterectomy 05/29/2016  . Headache 04/14/2016  . History of mammogram 03/02/2011  . Migraine syndrome   . Hx of colonic polyps   . GERD (gastroesophageal reflux disease) 12/16/2010    Shelton Silvas 11/15/2018, 3:41 PM  [facility] [address] [place], [address] Phone: [phone]   Fax:  [phone]  Name: Stephanie Henderson MRN: [id number] Date of Birth: May 14, 1966

## 2018-11-15 NOTE — Addendum Note (Signed)
Addended by: Shelton Silvas on: 11/15/2018 04:29 PM   Modules accepted: Orders

## 2018-11-16 LAB — HEMOGLOBIN A1C: Hemoglobin A1C: 5.4

## 2018-11-30 ENCOUNTER — Encounter: Payer: Self-pay | Admitting: Physical Therapy

## 2018-11-30 ENCOUNTER — Ambulatory Visit: Payer: 59 | Admitting: Physical Therapy

## 2018-11-30 ENCOUNTER — Other Ambulatory Visit: Payer: Self-pay

## 2018-11-30 DIAGNOSIS — G8929 Other chronic pain: Secondary | ICD-10-CM

## 2018-11-30 DIAGNOSIS — M25511 Pain in right shoulder: Secondary | ICD-10-CM | POA: Diagnosis not present

## 2018-11-30 DIAGNOSIS — R293 Abnormal posture: Secondary | ICD-10-CM

## 2018-11-30 NOTE — Therapy (Signed)
[place] [facility] [address] [place], [address] Phone: [phone]   Fax:  [phone]  Physical Therapy Treatment  Patient Details  Name: Stephanie Henderson MRN: [id number] Date of Birth: [birth year] Referring Provider (PT): Derrel Nip MD   Encounter Date: 11/30/2018  PT End of Session - 11/30/18 1413    Visit Number  8    Number of Visits  17    Date for PT Re-Evaluation  12/12/18    PT Start Time  0145    PT Stop Time  0230    PT Time Calculation (min)  45 min    Activity Tolerance  Patient tolerated treatment well    Behavior During Therapy  [facility] for tasks assessed/performed       Past Medical History:  Diagnosis Date  . Fibroid   . GERD (gastroesophageal reflux disease) Aug 2012   with esophagitis by EGD  . Hematuria   . Hx of colonic polyps August 2012   repeat due 2016,  5 polyps 2009, clear 2012 Gustavo Lah)  . Menorrhagia    did not tolerate trial of ocps due to migraines  . Migraine headache   . Migraine syndrome    since age 78,  sporadic    Past Surgical History:  Procedure Laterality Date  . BREAST BIOPSY Right 2017   benign  . COLONOSCOPY WITH PROPOFOL N/A 07/31/2018   Procedure: COLONOSCOPY WITH PROPOFOL;  Surgeon: Lollie Sails, MD;  Location: [facility] ENDOSCOPY;  Service: Endoscopy;  Laterality: N/A;  . CYSTOSCOPY    . ESOPHAGOGASTRODUODENOSCOPY (EGD) WITH PROPOFOL N/A 07/31/2018   Procedure: ESOPHAGOGASTRODUODENOSCOPY (EGD) WITH PROPOFOL;  Surgeon: Lollie Sails, MD;  Location: [facility] ENDOSCOPY;  Service: Endoscopy;  Laterality: N/A;  . laparoscopy    . VAGINAL HYSTERECTOMY  6/13   with morcellation    There were no vitals filed for this visit.  Subjective Assessment - 11/30/18 1351    Subjective  Reports 3/10 upper neck pain that she thinks is d/t stress. Reports she feels better overall but is having a hard day today with excessive muscle tension, that she thinks is d/t stress with  COVID school opennings.    Pertinent History  Patient is a 53 year old female with R shoulder pain that began August of this year when she took something out of her pocket and had intense lateral shoulder pain with "tingling". Patient reports pain was intermittent through Sept, but in Oct became pretty constant. Patient reports pain is sharp at the lateral shoulder, with a dull ache in ant and post shoulder, and that she is unable to sleep on that side. Patient reports most pain with "bringing her arm out to the side, with pain with behind the back motions as well. Patient reports shearing pain at lateral shoulder, with dull ache in post and ant shoulder- occasssional hand "tingling" and forearm ache. Patient reported worst pain over the past week 8/10; best 3/10. Pt denies N/V, B&B, unexplained weight fluctuation, saddle paresthesia, fever, night sweats, or unrelenting night pain at this time.    Limitations  Lifting;House hold activities    How long can you sit comfortably?  unlimited    How long can you stand comfortably?  unlimited    How long can you walk comfortably?  unlimited    Diagnostic tests  none at this time         ESTIM + heat pack Hi[employer]olt ESTIM 10 min at patient tolerated 185V increased  to 190V through treatment at [place] area . Attempted d/t success of treatment at previous session success. With PT assessing patient tolerance throughout (increasing intensity as needed), monitoring skin integrity (normal), with decreased pain noted from patient     Manual. - Multiple bouts of PROM into flex/abd/IR/ER (rotation at80d of abd) with10sec holds in max range of patient tolerance; increased difficulty with ER -STM withtrigger point releaseto R UT and R levator (most time spent on levator) Following: Dry Needling: (2/2) 16mm .3 needles placed along the R UT and levator to decrease increased muscular spasms and trigger points with the patient positioned in supine. Patient was educated  on risks and benefits of therapy and verbally consents to PT.  - [employer] PA mob Grade II for pain management 30sec bouts 6 bouts                         PT Education - 11/30/18 1412    Education Details  Relaxation exercises    Person(s) Educated  Patient    Methods  Explanation;Demonstration;Tactile cues;Verbal cues    Comprehension  Verbalized understanding;Returned demonstration;Verbal cues required;Tactile cues required       PT Short Term Goals - 11/15/18 1534      PT SHORT TERM GOAL #1   Title  Pt will be independent with HEP in order to improve strength and balance in order to decrease fall risk and improve function at home and work    Baseline  Completes HEP independently    Time  4    Period  Weeks    Status  New        PT Long Term Goals - 11/15/18 1535      PT LONG TERM GOAL #1   Title  Patient will increase FOTO score to 71 to demonstrate predicted increase in functional mobility to complete ADLs    Baseline  11/15/18 65    Time  8    Period  Weeks    Status  Revised      PT LONG TERM GOAL #2   Title  Patient will demonstrate symmetrical, full gross shoulder AROM in order to return to PLOF and complete ADLs    Baseline  09/19/18: L wnl all motions R: flex 134d ; abd 101d ; IR T9 ; ER CTJ    Time  8    Period  Weeks    Status  Revised      PT LONG TERM GOAL #3   Title  Patient will demonstrate gross shoulder strength 4+/5 or better to return to PLOF, and to be able to safely complete     Baseline  Flex 5/5 Abd 4+/5 IR 5/5 ER 5/5    Time  8    Period  Weeks    Status  Revised      PT LONG TERM GOAL #4   Title  Pt will decrease worst pain as reported on NPRS by at least 3 points in order to demonstrate clinically significant reduction in pain.    Baseline  11/15/18 3/10 with strenuous activity    Time  8    Period  Weeks    Status  Achieved            Plan - 11/30/18 1430    Clinical Impression Statement  Patient with increased muscle  tension this session, d/t increased stress. PT educated patient on deep breathing to prevent accessory muscle breathing and tension, and postural  focus to prevent tension from shoulder hiking posture. Patient verbalized understanding. Patient reports decreased pain following session with shoulder lower than before session, with good tissue response.    Personal Factors and Comorbidities  Past/Current Experience;Fitness;Sex;Time since onset of injury/illness/exacerbation    Examination-Activity Limitations  Carry;Reach Overhead;Caring for Others;Lift    Examination-Participation Restrictions  Cleaning;Laundry;Community Activity;Driving    Rehab Potential  Good    Clinical Impairments Affecting Rehab Potential  (+) fairly yong age, motivation (-) Sedentary lifestyle, chronicity of pain, hypersensitivity, psycho-social factors    PT Frequency  2x / week    PT Duration  8 weeks    PT Treatment/Interventions  Moist Heat;Traction;Ultrasound;Cryotherapy;Fluidtherapy;Electrical Stimulation;Therapeutic exercise;Taping;Patient/family education;Therapeutic activities;Functional mobility training;Neuromuscular re-education;Passive range of motion;Dry needling;Manual techniques    PT Next Visit Plan  If pt agreeable shift toward strengthening in available range.    PT Home Exercise Plan  Supine pec stretch, supine IR stretch, pulleys AAROM flex/abd, scapular retractions    Consulted and Agree with Plan of Care  Patient       Patient will benefit from skilled therapeutic intervention in order to improve the following deficits and impairments:  Improper body mechanics, Pain, Postural dysfunction, Impaired tone, Decreased activity tolerance, Decreased endurance, Decreased range of motion, Decreased strength, Impaired UE functional use  Visit Diagnosis: 1. Chronic right shoulder pain   2. Acute pain of right shoulder   3. Abnormal posture        Problem List Patient Active Problem List   Diagnosis Date  Noted  . Reflux esophagitis 08/02/2018  . Right shoulder pain 07/13/2018  . Frozen shoulder syndrome 06/13/2018  . Encounter for preventative adult health care exam with abnormal findings 05/27/2018  . Raynaud phenomenon 05/28/2017  . Generalized anxiety disorder 05/29/2016  . S/P hysterectomy 05/29/2016  . Headache 04/14/2016  . History of mammogram 03/02/2011  . Migraine syndrome   . Hx of colonic polyps   . GERD (gastroesophageal reflux disease) 12/16/2010   Shelton Silvas PT, DPT Shelton Silvas 11/30/2018, 3:09 PM   [place] [facility] [address], [place], [address] Phone: [phone]   Fax:  [phone]  Name: Stephanie Henderson MRN: [id number] Date of Birth: 11/07/1965

## 2018-12-05 ENCOUNTER — Other Ambulatory Visit: Payer: Self-pay

## 2018-12-05 ENCOUNTER — Ambulatory Visit: Payer: 59 | Admitting: Physical Therapy

## 2018-12-05 ENCOUNTER — Encounter: Payer: Self-pay | Admitting: Physical Therapy

## 2018-12-05 DIAGNOSIS — M25511 Pain in right shoulder: Secondary | ICD-10-CM

## 2018-12-05 DIAGNOSIS — R293 Abnormal posture: Secondary | ICD-10-CM

## 2018-12-05 DIAGNOSIS — G8929 Other chronic pain: Secondary | ICD-10-CM

## 2018-12-05 NOTE — Therapy (Signed)
[place] [facility] [address], [place], [address] Phone: [phone]   Fax:  [phone]  Physical Therapy Treatment  Patient Details  Name: Stephanie Henderson MRN: [id number] Date of Birth: 1966/01/15 Referring Provider (PT): Derrel Nip MD   Encounter Date: 12/05/2018  PT End of Session - 12/05/18 1649    Visit Number  9    Number of Visits  17    Date for PT Re-Evaluation  12/12/18    PT Start Time  0415    PT Stop Time  0500    PT Time Calculation (min)  45 min    Activity Tolerance  Patient tolerated treatment well    Behavior During Therapy  [facility] for tasks assessed/performed       Past Medical History:  Diagnosis Date  . Fibroid   . GERD (gastroesophageal reflux disease) Aug 2012   with esophagitis by EGD  . Hematuria   . Hx of colonic polyps August 2012   repeat due 2016,  5 polyps 2009, clear 2012 Gustavo Lah)  . Menorrhagia    did not tolerate trial of ocps due to migraines  . Migraine headache   . Migraine syndrome    since age 8,  sporadic    Past Surgical History:  Procedure Laterality Date  . BREAST BIOPSY Right 2017   benign  . COLONOSCOPY WITH PROPOFOL N/A 07/31/2018   Procedure: COLONOSCOPY WITH PROPOFOL;  Surgeon: Lollie Sails, MD;  Location: [facility] ENDOSCOPY;  Service: Endoscopy;  Laterality: N/A;  . CYSTOSCOPY    . ESOPHAGOGASTRODUODENOSCOPY (EGD) WITH PROPOFOL N/A 07/31/2018   Procedure: ESOPHAGOGASTRODUODENOSCOPY (EGD) WITH PROPOFOL;  Surgeon: Lollie Sails, MD;  Location: [facility] ENDOSCOPY;  Service: Endoscopy;  Laterality: N/A;  . laparoscopy    . VAGINAL HYSTERECTOMY  6/13   with morcellation    There were no vitals filed for this visit.  Subjective Assessment - 12/05/18 1618    Subjective  Reports she feels better than last time. Does report 3/10 pain but reports it does feel better, more achy to date    Pertinent History  Patient is a 53 year old female with R shoulder pain  that began August of this year when she took something out of her pocket and had intense lateral shoulder pain with "tingling". Patient reports pain was intermittent through Sept, but in Oct became pretty constant. Patient reports pain is sharp at the lateral shoulder, with a dull ache in ant and post shoulder, and that she is unable to sleep on that side. Patient reports most pain with "bringing her arm out to the side, with pain with behind the back motions as well. Patient reports shearing pain at lateral shoulder, with dull ache in post and ant shoulder- occasssional hand "tingling" and forearm ache. Patient reported worst pain over the past week 8/10; best 3/10. Pt denies N/V, B&B, unexplained weight fluctuation, saddle paresthesia, fever, night sweats, or unrelenting night pain at this time.    Limitations  Lifting;House hold activities    How long can you sit comfortably?  unlimited    How long can you stand comfortably?  unlimited    How long can you walk comfortably?  unlimited    Diagnostic tests  none at this time    Pain Onset  More than a month ago          Ther-Ex - Prone snow angels 3x 5 with cuing for maintained scapular retraction throughout with decent carry  over following (difficulty maintaining raise from 90-180d abd) - Scapular push up from knees x10 from feet 2x 10 with demo and cuing for full retraction with good carry over - Qped row with 10# 3x 10 with cuing initially to maintain tension and eccentric control with good carry over following - High rows   Manual. - Multiple bouts of PROM into flex/abd/IR/ER (rotation at80d of abd) with10sec holds in max range of patient tolerance; increased difficulty with ER -STM withtrigger point releaseto R UT and R lateral deltoid                       PT Education - 12/05/18 1648    Education Details  Therex form    Person(s) Educated  Patient    Methods  Explanation;Demonstration;Tactile cues;Verbal  cues    Comprehension  Verbalized understanding;Returned demonstration;Verbal cues required;Tactile cues required       PT Short Term Goals - 11/15/18 1534      PT SHORT TERM GOAL #1   Title  Pt will be independent with HEP in order to improve strength and balance in order to decrease fall risk and improve function at home and work    Baseline  Completes HEP independently    Time  4    Period  Weeks    Status  New        PT Long Term Goals - 11/15/18 1535      PT LONG TERM GOAL #1   Title  Patient will increase FOTO score to 71 to demonstrate predicted increase in functional mobility to complete ADLs    Baseline  11/15/18 65    Time  8    Period  Weeks    Status  Revised      PT LONG TERM GOAL #2   Title  Patient will demonstrate symmetrical, full gross shoulder AROM in order to return to PLOF and complete ADLs    Baseline  09/19/18: L wnl all motions R: flex 134d ; abd 101d ; IR T9 ; ER CTJ    Time  8    Period  Weeks    Status  Revised      PT LONG TERM GOAL #3   Title  Patient will demonstrate gross shoulder strength 4+/5 or better to return to PLOF, and to be able to safely complete     Baseline  Flex 5/5 Abd 4+/5 IR 5/5 ER 5/5    Time  8    Period  Weeks    Status  Revised      PT LONG TERM GOAL #4   Title  Pt will decrease worst pain as reported on NPRS by at least 3 points in order to demonstrate clinically significant reduction in pain.    Baseline  11/15/18 3/10 with strenuous activity    Time  8    Period  Weeks    Status  Achieved            Plan - 12/05/18 1656    Clinical Impression Statement  Patient is continuing to improve ROM, with improved motion following manual techniques. PT continued therex progression for periscapular musculature and scaulo-humeral rhythm, with patinet needing some cuing for proper form with good carry over following. Patient demonstrates increased difficulty R>L with scauplar retraction and maintaining this.    Personal  Factors and Comorbidities  Past/Current Experience;Fitness;Sex;Time since onset of injury/illness/exacerbation    Examination-Activity Limitations  Carry;Reach Overhead;Caring for Others;Lift  Examination-Participation Restrictions  Cleaning;Laundry;Community Activity;Driving    Stability/Clinical Decision Making  Evolving/Moderate complexity    Clinical Decision Making  Moderate    Rehab Potential  Good    Clinical Impairments Affecting Rehab Potential  (+) fairly yong age, motivation (-) Sedentary lifestyle, chronicity of pain, hypersensitivity, psycho-social factors    PT Frequency  2x / week    PT Duration  8 weeks    PT Treatment/Interventions  Moist Heat;Traction;Ultrasound;Cryotherapy;Fluidtherapy;Electrical Stimulation;Therapeutic exercise;Taping;Patient/family education;Therapeutic activities;Functional mobility training;Neuromuscular re-education;Passive range of motion;Dry needling;Manual techniques    PT Next Visit Plan  If pt agreeable shift toward strengthening in available range.    PT Home Exercise Plan  Supine pec stretch, supine IR stretch, pulleys AAROM flex/abd, scapular retractions    Consulted and Agree with Plan of Care  Patient       Patient will benefit from skilled therapeutic intervention in order to improve the following deficits and impairments:  Improper body mechanics, Pain, Postural dysfunction, Impaired tone, Decreased activity tolerance, Decreased endurance, Decreased range of motion, Decreased strength, Impaired UE functional use  Visit Diagnosis: 1. Chronic right shoulder pain   2. Acute pain of right shoulder   3. Abnormal posture        Problem List Patient Active Problem List   Diagnosis Date Noted  . Reflux esophagitis 08/02/2018  . Right shoulder pain 07/13/2018  . Frozen shoulder syndrome 06/13/2018  . Encounter for preventative adult health care exam with abnormal findings 05/27/2018  . Raynaud phenomenon 05/28/2017  . Generalized  anxiety disorder 05/29/2016  . S/P hysterectomy 05/29/2016  . Headache 04/14/2016  . History of mammogram 03/02/2011  . Migraine syndrome   . Hx of colonic polyps   . GERD (gastroesophageal reflux disease) 12/16/2010   Shelton Silvas PT, DPT Shelton Silvas 12/05/2018, 5:01 PM  [place] [facility] [address], [place], [address] Phone: [phone]   Fax:  [phone]  Name: Stephanie Henderson MRN: [id number] Date of Birth: 31-Aug-1965

## 2018-12-13 DIAGNOSIS — K219 Gastro-esophageal reflux disease without esophagitis: Secondary | ICD-10-CM | POA: Diagnosis not present

## 2018-12-13 DIAGNOSIS — R7303 Prediabetes: Secondary | ICD-10-CM | POA: Diagnosis not present

## 2018-12-13 DIAGNOSIS — R5383 Other fatigue: Secondary | ICD-10-CM | POA: Diagnosis not present

## 2018-12-13 DIAGNOSIS — N951 Menopausal and female climacteric states: Secondary | ICD-10-CM | POA: Diagnosis not present

## 2018-12-14 ENCOUNTER — Ambulatory Visit: Payer: 59 | Admitting: Physical Therapy

## 2018-12-19 ENCOUNTER — Ambulatory Visit: Payer: 59 | Admitting: Physical Therapy

## 2018-12-21 DIAGNOSIS — N951 Menopausal and female climacteric states: Secondary | ICD-10-CM | POA: Diagnosis not present

## 2018-12-21 DIAGNOSIS — R5383 Other fatigue: Secondary | ICD-10-CM | POA: Diagnosis not present

## 2018-12-21 DIAGNOSIS — R7303 Prediabetes: Secondary | ICD-10-CM | POA: Diagnosis not present

## 2018-12-21 DIAGNOSIS — K219 Gastro-esophageal reflux disease without esophagitis: Secondary | ICD-10-CM | POA: Diagnosis not present

## 2018-12-27 ENCOUNTER — Other Ambulatory Visit: Payer: Self-pay

## 2018-12-27 ENCOUNTER — Encounter: Payer: Self-pay | Admitting: Physical Therapy

## 2018-12-27 ENCOUNTER — Ambulatory Visit: Payer: 59 | Attending: Internal Medicine | Admitting: Physical Therapy

## 2018-12-27 DIAGNOSIS — M25511 Pain in right shoulder: Secondary | ICD-10-CM | POA: Insufficient documentation

## 2018-12-27 DIAGNOSIS — R293 Abnormal posture: Secondary | ICD-10-CM | POA: Diagnosis not present

## 2018-12-27 DIAGNOSIS — G8929 Other chronic pain: Secondary | ICD-10-CM | POA: Insufficient documentation

## 2018-12-27 NOTE — Therapy (Signed)
[facility] [address] [place], [address] Phone: [phone]   Fax:  [phone]  Physical Therapy Treatment  Patient Details  Name: Stephanie Henderson MRN: [id number] Date of Birth: 1965/07/05 Referring Provider (PT): Derrel Nip MD   Encounter Date: 12/27/2018  PT End of Session - 12/27/18 1509    Visit Number  10    Number of Visits  25    Date for PT Re-Evaluation  01/26/19    PT Start Time  0245    PT Stop Time  0330    PT Time Calculation (min)  45 min    Activity Tolerance  Patient tolerated treatment well    Behavior During Therapy  [facility] for tasks assessed/performed       Past Medical History:  Diagnosis Date  . Fibroid   . GERD (gastroesophageal reflux disease) Aug 2012   with esophagitis by EGD  . Hematuria   . Hx of colonic polyps August 2012   repeat due 2016,  5 polyps 2009, clear 2012 Gustavo Lah)  . Menorrhagia    did not tolerate trial of ocps due to migraines  . Migraine headache   . Migraine syndrome    since age 79,  sporadic    Past Surgical History:  Procedure Laterality Date  . BREAST BIOPSY Right 2017   benign  . COLONOSCOPY WITH PROPOFOL N/A 07/31/2018   Procedure: COLONOSCOPY WITH PROPOFOL;  Surgeon: Lollie Sails, MD;  Location: [facility] ENDOSCOPY;  Service: Endoscopy;  Laterality: N/A;  . CYSTOSCOPY    . ESOPHAGOGASTRODUODENOSCOPY (EGD) WITH PROPOFOL N/A 07/31/2018   Procedure: ESOPHAGOGASTRODUODENOSCOPY (EGD) WITH PROPOFOL;  Surgeon: Lollie Sails, MD;  Location: [facility] ENDOSCOPY;  Service: Endoscopy;  Laterality: N/A;  . laparoscopy    . VAGINAL HYSTERECTOMY  6/13   with morcellation    There were no vitals filed for this visit.  Subjective Assessment - 12/27/18 1453    Subjective  Patinet reports she is having a rough week, that her shoulder is aching. She is [place] and has been able to do more than before, but she  is more sore after being able to do these activities. Reports most pain with rotational movements like pulling a shirt off. Reports pain is 3/10 today.    Pertinent History  Patient is a 53 year old female with R shoulder pain that began August of this year when she took something out of her pocket and had intense lateral shoulder pain with "tingling". Patient reports pain was intermittent through Sept, but in Oct became pretty constant. Patient reports pain is sharp at the lateral shoulder, with a dull ache in ant and post shoulder, and that she is unable to sleep on that side. Patient reports most pain with "bringing her arm out to the side, with pain with behind the back motions as well. Patient reports shearing pain at lateral shoulder, with dull ache in post and ant shoulder- occasssional hand "tingling" and forearm ache. Patient reported worst pain over the past week 8/10; best 3/10. Pt denies N/V, B&B, unexplained weight fluctuation, saddle paresthesia, fever, night sweats, or unrelenting night pain at this time.    Limitations  Lifting;House hold activities    How long can you sit comfortably?  unlimited    How long can you stand comfortably?  unlimited    How long can you walk comfortably?  unlimited    Diagnostic tests  none at this time       Ther-Ex -90/90 ER with BluTB 3x 8 (attempted 5# unable) cuing for set up, posture, with good carry over - Qped row with 10# 3x 10 with cuing initially to maintain tension and eccentric control with good carry over following - Wall slides GTB with min cuing for proper form set up with good carry over  Manual. -STM withtrigger point releaseto R UT and R lateral deltoid; bicep cross friction massage (favorable to patient)     ESTIM + heat pack [employer] ESTIM 10 min at patient tolerated 170V increased to 175V through treatment at R UT and lateral shoulder. Attempted d/t success of treatment at previous session success. With PT assessing patient  tolerance throughout (increasing intensity as needed), monitoring skin integrity (normal), with decreased pain noted from patient                         PT Education - 12/27/18 1508    Education Details  therex form, ESTIM    Person(s) Educated  Patient    Methods  Explanation;Demonstration;Verbal cues;Tactile cues    Comprehension  Verbalized understanding;Returned demonstration;Verbal cues required;Tactile cues required       PT Short Term Goals - 11/15/18 1534      PT SHORT TERM GOAL #1   Title  Pt will be independent with HEP in order to improve strength and balance in order to decrease fall risk and improve function at home and work    Baseline  Completes HEP independently    Time  4    Period  Weeks    Status  New        PT Long Term Goals - 12/27/18 1451      PT LONG TERM GOAL #1   Title  Patient will increase FOTO score to 71 to demonstrate predicted increase in functional mobility to complete ADLs    Baseline  12/27/18 63    Time  8    Period  Weeks    Status  Revised      PT LONG TERM GOAL #2   Title  Patient will demonstrate symmetrical, full gross shoulder AROM in order to return to PLOF and complete ADLs    Baseline  12/27/18 L wnl all motions R: flex 180 ; abd 180d ; IR T10 ; ER CTJ    Time  8    Period  Weeks    Status  Achieved      PT LONG TERM GOAL #3   Title  Patient will demonstrate gross shoulder strength 4+/5 or better to return to PLOF, and to be able to safely complete     Baseline  Gross 5/5 bilat    Time  8    Period  Weeks    Status  Achieved      PT LONG TERM GOAL #4   Title  Pt will decrease worst pain as reported on NPRS by at least 3 points in order to demonstrate clinically significant reduction in pain.    Baseline  12/27/18 3/10 with strenuous activity    Time  8    Period  Weeks    Status  Achieved            Plan - 12/27/18 1529    Clinical Impression Statement  Patient continues to have some soreness  and trigger points at anterior shoulder, that resolves well with manual and ESTIM. PT continued therex progression for  compound UE movements with good carry over of proper form following. Pt will continue to benefit from skilled physical therapy 1x/week for 4 weeks to address pain/soreness with increased activity level and ensure proper motor control and strength/stability in higher level tasks.    Personal Factors and Comorbidities  Past/Current Experience;Fitness;Sex;Time since onset of injury/illness/exacerbation    Examination-Activity Limitations  Carry;Reach Overhead;Caring for Others;Lift    Examination-Participation Restrictions  Cleaning;Laundry;Community Activity;Driving    Stability/Clinical Decision Making  Evolving/Moderate complexity    Clinical Decision Making  Moderate    Rehab Potential  Good    Clinical Impairments Affecting Rehab Potential  (+) fairly yong age, motivation (-) Sedentary lifestyle, chronicity of pain, hypersensitivity, psycho-social factors    PT Frequency  2x / week    PT Duration  8 weeks    PT Treatment/Interventions  Moist Heat;Traction;Ultrasound;Cryotherapy;Fluidtherapy;Electrical Stimulation;Therapeutic exercise;Taping;Patient/family education;Therapeutic activities;Functional mobility training;Neuromuscular re-education;Passive range of motion;Dry needling;Manual techniques    PT Next Visit Plan  If pt agreeable shift toward strengthening in available range.    PT Home Exercise Plan  Supine pec stretch, supine IR stretch, pulleys AAROM flex/abd, scapular retractions    Consulted and Agree with Plan of Care  Patient       Patient will benefit from skilled therapeutic intervention in order to improve the following deficits and impairments:  Improper body mechanics, Pain, Postural dysfunction, Impaired tone, Decreased activity tolerance, Decreased endurance, Decreased range of motion, Decreased strength, Impaired UE functional use  Visit Diagnosis: 1.  Chronic right shoulder pain   2. Acute pain of right shoulder   3. Abnormal posture        Problem List Patient Active Problem List   Diagnosis Date Noted  . Reflux esophagitis 08/02/2018  . Right shoulder pain 07/13/2018  . Frozen shoulder syndrome 06/13/2018  . Encounter for preventative adult health care exam with abnormal findings 05/27/2018  . Raynaud phenomenon 05/28/2017  . Generalized anxiety disorder 05/29/2016  . S/P hysterectomy 05/29/2016  . Headache 04/14/2016  . History of mammogram 03/02/2011  . Migraine syndrome   . Hx of colonic polyps   . GERD (gastroesophageal reflux disease) 12/16/2010   Shelton Silvas PT, DPT Shelton Silvas 12/27/2018, 3:41 PM  [facility] [address], [place], [address] Phone: [phone]   Fax:  [phone]  Name: MKENZIE DOTTS MRN: [id number] Date of Birth: [birth year]

## 2019-01-03 ENCOUNTER — Ambulatory Visit: Payer: 59 | Admitting: Physical Therapy

## 2019-01-03 ENCOUNTER — Encounter: Payer: Self-pay | Admitting: Physical Therapy

## 2019-01-03 ENCOUNTER — Other Ambulatory Visit: Payer: Self-pay

## 2019-01-03 DIAGNOSIS — M25511 Pain in right shoulder: Secondary | ICD-10-CM | POA: Diagnosis not present

## 2019-01-03 DIAGNOSIS — G8929 Other chronic pain: Secondary | ICD-10-CM

## 2019-01-03 DIAGNOSIS — R293 Abnormal posture: Secondary | ICD-10-CM

## 2019-01-03 NOTE — Therapy (Signed)
[facility] [address] [place], [address] Phone: [phone]   Fax:  [phone]  Physical Therapy Treatment  Patient Details  Name: Stephanie Henderson MRN: [id number] Date of Birth: 01/23/1966 Referring Provider (PT): Derrel Nip MD   Encounter Date: 01/03/2019    Past Medical History:  Diagnosis Date  . Fibroid   . GERD (gastroesophageal reflux disease) Aug 2012   with esophagitis by EGD  . Hematuria   . Hx of colonic polyps August 2012   repeat due 2016,  5 polyps 2009, clear 2012 Gustavo Lah)  . Menorrhagia    did not tolerate trial of ocps due to migraines  . Migraine headache   . Migraine syndrome    since age 43,  sporadic    Past Surgical History:  Procedure Laterality Date  . BREAST BIOPSY Right 2017   benign  . COLONOSCOPY WITH PROPOFOL N/A 07/31/2018   Procedure: COLONOSCOPY WITH PROPOFOL;  Surgeon: Lollie Sails, MD;  Location: [facility] ENDOSCOPY;  Service: Endoscopy;  Laterality: N/A;  . CYSTOSCOPY    . ESOPHAGOGASTRODUODENOSCOPY (EGD) WITH PROPOFOL N/A 07/31/2018   Procedure: ESOPHAGOGASTRODUODENOSCOPY (EGD) WITH PROPOFOL;  Surgeon: Lollie Sails, MD;  Location: [facility] ENDOSCOPY;  Service: Endoscopy;  Laterality: N/A;  . laparoscopy    . VAGINAL HYSTERECTOMY  6/13   with morcellation    There were no vitals filed for this visit.         Ther-Ex - Bent over rows 10# 3x 10 with cuing for posture set up  - 90/90 ER with BluTB 3x 8 (attempted 5# unable) cuing for set up, posture, with good carry over - Seated military press 5# DB 3x 10 - High rows 10# 3x 10 with cuing for set up posture with good carry over following - Wall slides GTB with min cuing for proper form set up with good carry over  Manual. -STM withtrigger point releaseto R UT, pec major/moinor, and R lateral deltoid; bicep cross friction massage (favorable to patient) PROM in all directions with 5-10sec holds in max  stretch, increasing as able. Most limited in IR>abd>ER, a little different than previously                            PT Short Term Goals - 11/15/18 1534      PT SHORT TERM GOAL #1   Title  Pt will be independent with HEP in order to improve strength and balance in order to decrease fall risk and improve function at home and work    Baseline  Completes HEP independently    Time  4    Period  Weeks    Status  New        PT Long Term Goals - 12/27/18 1451      PT LONG TERM GOAL #1   Title  Patient will increase FOTO score to 71 to demonstrate predicted increase in functional mobility to complete ADLs    Baseline  12/27/18 63    Time  8    Period  Weeks    Status  Revised      PT LONG TERM GOAL #2   Title  Patient will demonstrate symmetrical, full gross shoulder AROM in order to return to PLOF and complete ADLs    Baseline  12/27/18 L wnl all motions R: flex 180 ; abd 180d ; IR T10 ; ER CTJ    Time  8    Period  Weeks    Status  Achieved      PT LONG TERM GOAL #3   Title  Patient will demonstrate gross shoulder strength 4+/5 or better to return to PLOF, and to be able to safely complete     Baseline  Gross 5/5 bilat    Time  8    Period  Weeks    Status  Achieved      PT LONG TERM GOAL #4   Title  Pt will decrease worst pain as reported on NPRS by at least 3 points in order to demonstrate clinically significant reduction in pain.    Baseline  12/27/18 3/10 with strenuous activity    Time  8    Period  Weeks    Status  Achieved              Patient will benefit from skilled therapeutic intervention in order to improve the following deficits and impairments:     Visit Diagnosis: No diagnosis found.     Problem List Patient Active Problem List   Diagnosis Date Noted  . Reflux esophagitis 08/02/2018  . Right shoulder pain 07/13/2018  . Frozen shoulder syndrome 06/13/2018  . Encounter for preventative adult health care exam with  abnormal findings 05/27/2018  . Raynaud phenomenon 05/28/2017  . Generalized anxiety disorder 05/29/2016  . S/P hysterectomy 05/29/2016  . Headache 04/14/2016  . History of mammogram 03/02/2011  . Migraine syndrome   . Hx of colonic polyps   . GERD (gastroesophageal reflux disease) 12/16/2010    Shelton Silvas 01/03/2019, 1:49 PM  [facility] [address] [place], [address] Phone: [phone]   Fax:  [phone]  Name: Stephanie Henderson MRN: [id number] Date of Birth: 12/08/1965

## 2019-01-10 ENCOUNTER — Encounter: Payer: 59 | Admitting: Physical Therapy

## 2019-01-10 DIAGNOSIS — R5383 Other fatigue: Secondary | ICD-10-CM | POA: Diagnosis not present

## 2019-01-10 DIAGNOSIS — R7303 Prediabetes: Secondary | ICD-10-CM | POA: Diagnosis not present

## 2019-01-10 DIAGNOSIS — N951 Menopausal and female climacteric states: Secondary | ICD-10-CM | POA: Diagnosis not present

## 2019-01-17 ENCOUNTER — Encounter: Payer: Self-pay | Admitting: Physical Therapy

## 2019-01-17 ENCOUNTER — Other Ambulatory Visit: Payer: Self-pay

## 2019-01-17 ENCOUNTER — Ambulatory Visit: Payer: 59 | Attending: Internal Medicine | Admitting: Physical Therapy

## 2019-01-17 DIAGNOSIS — R293 Abnormal posture: Secondary | ICD-10-CM | POA: Insufficient documentation

## 2019-01-17 DIAGNOSIS — G8929 Other chronic pain: Secondary | ICD-10-CM | POA: Insufficient documentation

## 2019-01-17 DIAGNOSIS — M25511 Pain in right shoulder: Secondary | ICD-10-CM | POA: Insufficient documentation

## 2019-01-17 NOTE — Therapy (Signed)
[facility] [address], [place], [address] Phone: [phone]   Fax:  [phone]  Physical Therapy Treatment  Patient Details  Name: Stephanie Henderson MRN: [id number] Date of Birth: [birth year] Referring Provider (PT): Derrel Nip MD   Encounter Date: 01/17/2019  PT End of Session - 01/17/19 1359    Visit Number  12    Number of Visits  25    Date for PT Re-Evaluation  01/26/19    PT Start Time  0130    PT Stop Time  0215    PT Time Calculation (min)  45 min    Activity Tolerance  Patient tolerated treatment well    Behavior During Therapy  [facility] for tasks assessed/performed       Past Medical History:  Diagnosis Date  . Fibroid   . GERD (gastroesophageal reflux disease) Aug 2012   with esophagitis by EGD  . Hematuria   . Hx of colonic polyps August 2012   repeat due 2016,  5 polyps 2009, clear 2012 Gustavo Lah)  . Menorrhagia    did not tolerate trial of ocps due to migraines  . Migraine headache   . Migraine syndrome    since age 53,  sporadic    Past Surgical History:  Procedure Laterality Date  . BREAST BIOPSY Right 2017   benign  . COLONOSCOPY WITH PROPOFOL N/A 07/31/2018   Procedure: COLONOSCOPY WITH PROPOFOL;  Surgeon: Lollie Sails, MD;  Location: [facility] ENDOSCOPY;  Service: Endoscopy;  Laterality: N/A;  . CYSTOSCOPY    . ESOPHAGOGASTRODUODENOSCOPY (EGD) WITH PROPOFOL N/A 07/31/2018   Procedure: ESOPHAGOGASTRODUODENOSCOPY (EGD) WITH PROPOFOL;  Surgeon: Lollie Sails, MD;  Location: [facility] ENDOSCOPY;  Service: Endoscopy;  Laterality: N/A;  . laparoscopy    . VAGINAL HYSTERECTOMY  6/13   with morcellation    There were no vitals filed for this visit.  Subjective Assessment - 01/17/19 1336    Subjective  Reports very minimal shoulder pain, good motion. Reports she does have some pain at night.    Pertinent History  Patient is a 53 year old female with R shoulder pain that began August of  this year when she took something out of her pocket and had intense lateral shoulder pain with "tingling". Patient reports pain was intermittent through Sept, but in Oct became pretty constant. Patient reports pain is sharp at the lateral shoulder, with a dull ache in ant and post shoulder, and that she is unable to sleep on that side. Patient reports most pain with "bringing her arm out to the side, with pain with behind the back motions as well. Patient reports shearing pain at lateral shoulder, with dull ache in post and ant shoulder- occasssional hand "tingling" and forearm ache. Patient reported worst pain over the past week 8/10; best 3/10. Pt denies N/V, B&B, unexplained weight fluctuation, saddle paresthesia, fever, night sweats, or unrelenting night pain at this time.    Limitations  Lifting;House hold activities    How long can you sit comfortably?  unlimited    How long can you stand comfortably?  unlimited    How long can you walk comfortably?  unlimited    Diagnostic tests  none at this time          Ther-Ex - Qped UE taps RTB 3x 10 with min cuing initially for control with good carry over following -  Alt serratus press on bosu ball (hard side) in knee plank 3x 10  with cuing for control with good carry over - Seated military press 6# DB 3x 10  - High rows 10# 3x 10 with cuing for set up posture with good carry over   Manual. -STM withtrigger point releaseto R UT, pec major/moinor, and R lateral deltoid; bicep cross friction massage proximal insertion (favorable to patient) PROM in all directions with 5-10sec holds in max stretch                      PT Education - 01/17/19 1359    Education Details  Therex form    Person(s) Educated  Patient    Methods  Explanation;Demonstration;Verbal cues    Comprehension  Verbalized understanding;Returned demonstration;Verbal cues required       PT Short Term Goals - 11/15/18 1534      PT SHORT TERM GOAL #1    Title  Pt will be independent with HEP in order to improve strength and balance in order to decrease fall risk and improve function at home and work    Baseline  Completes HEP independently    Time  4    Period  Weeks    Status  New        PT Long Term Goals - 12/27/18 1451      PT LONG TERM GOAL #1   Title  Patient will increase FOTO score to 71 to demonstrate predicted increase in functional mobility to complete ADLs    Baseline  12/27/18 63    Time  8    Period  Weeks    Status  Revised      PT LONG TERM GOAL #2   Title  Patient will demonstrate symmetrical, full gross shoulder AROM in order to return to PLOF and complete ADLs    Baseline  12/27/18 L wnl all motions R: flex 180 ; abd 180d ; IR T10 ; ER CTJ    Time  8    Period  Weeks    Status  Achieved      PT LONG TERM GOAL #3   Title  Patient will demonstrate gross shoulder strength 4+/5 or better to return to PLOF, and to be able to safely complete     Baseline  Gross 5/5 bilat    Time  8    Period  Weeks    Status  Achieved      PT LONG TERM GOAL #4   Title  Pt will decrease worst pain as reported on NPRS by at least 3 points in order to demonstrate clinically significant reduction in pain.    Baseline  12/27/18 3/10 with strenuous activity    Time  8    Period  Weeks    Status  Achieved            Plan - 01/17/19 1436    Clinical Impression Statement  PT continued manual for minimal soft tissue tension with good resolution. PT continued therex progression for periscapular and shoulder stability and stregnthening with good success. Patient is able to complete all therex with proper technique following minimal cuing, with more compound movements. PT will reassess patient next visit to determine D/C readiness.    Personal Factors and Comorbidities  Past/Current Experience;Fitness;Sex;Time since onset of injury/illness/exacerbation    Examination-Activity Limitations  Carry;Reach Overhead;Caring for Others;Lift     Examination-Participation Restrictions  Cleaning;Laundry;Community Activity;Driving    Stability/Clinical Decision Making  Evolving/Moderate complexity    Clinical Decision Making  Moderate    Rehab  Potential  Good    Clinical Impairments Affecting Rehab Potential  (+) fairly yong age, motivation (-) Sedentary lifestyle, chronicity of pain, hypersensitivity, psycho-social factors    PT Frequency  2x / week    PT Duration  8 weeks    PT Treatment/Interventions  Moist Heat;Traction;Ultrasound;Cryotherapy;Fluidtherapy;Electrical Stimulation;Therapeutic exercise;Taping;Patient/family education;Therapeutic activities;Functional mobility training;Neuromuscular re-education;Passive range of motion;Dry needling;Manual techniques;Visual/perceptual remediation/compensation    PT Next Visit Plan  If pt agreeable shift toward strengthening in available range.    PT Home Exercise Plan  Supine pec stretch, supine IR stretch, pulleys AAROM flex/abd, scapular retractions    Consulted and Agree with Plan of Care  Patient       Patient will benefit from skilled therapeutic intervention in order to improve the following deficits and impairments:  Improper body mechanics, Pain, Postural dysfunction, Impaired tone, Decreased activity tolerance, Decreased endurance, Decreased range of motion, Decreased strength, Impaired UE functional use  Visit Diagnosis: Chronic right shoulder pain  Acute pain of right shoulder  Abnormal posture     Problem List Patient Active Problem List   Diagnosis Date Noted  . Reflux esophagitis 08/02/2018  . Right shoulder pain 07/13/2018  . Frozen shoulder syndrome 06/13/2018  . Encounter for preventative adult health care exam with abnormal findings 05/27/2018  . Raynaud phenomenon 05/28/2017  . Generalized anxiety disorder 05/29/2016  . S/P hysterectomy 05/29/2016  . Headache 04/14/2016  . History of mammogram 03/02/2011  . Migraine syndrome   . Hx of colonic polyps    . GERD (gastroesophageal reflux disease) 12/16/2010   Shelton Silvas PT, DPT Shelton Silvas 01/17/2019, 2:44 PM  [facility] [address], [place], [address] Phone: [phone]   Fax:  [phone]  Name: AMISHI DUSSEAULT MRN: [id number] Date of Birth: 1965-10-20

## 2019-01-24 ENCOUNTER — Other Ambulatory Visit: Payer: Self-pay

## 2019-01-24 ENCOUNTER — Encounter: Payer: Self-pay | Admitting: Physical Therapy

## 2019-01-24 ENCOUNTER — Ambulatory Visit: Payer: 59 | Admitting: Physical Therapy

## 2019-01-24 DIAGNOSIS — G8929 Other chronic pain: Secondary | ICD-10-CM

## 2019-01-24 DIAGNOSIS — M25511 Pain in right shoulder: Secondary | ICD-10-CM | POA: Diagnosis not present

## 2019-01-24 DIAGNOSIS — R293 Abnormal posture: Secondary | ICD-10-CM

## 2019-01-24 NOTE — Therapy (Signed)
[facility] [address] [place], [address] Phone: [phone]   Fax:  [phone]  Physical Therapy Treatment/DC Summary  Patient Details  Name: Stephanie Henderson MRN: [id number] Date of Birth: 1965/12/19 Referring Provider (PT): Derrel Nip MD   Encounter Date: 01/24/2019    Past Medical History:  Diagnosis Date  . Fibroid   . GERD (gastroesophageal reflux disease) Aug 2012   with esophagitis by EGD  . Hematuria   . Hx of colonic polyps August 2012   repeat due 2016,  5 polyps 2009, clear 2012 Gustavo Lah)  . Menorrhagia    did not tolerate trial of ocps due to migraines  . Migraine headache   . Migraine syndrome    since age 40,  sporadic    Past Surgical History:  Procedure Laterality Date  . BREAST BIOPSY Right 2017   benign  . COLONOSCOPY WITH PROPOFOL N/A 07/31/2018   Procedure: COLONOSCOPY WITH PROPOFOL;  Surgeon: Lollie Sails, MD;  Location: [facility] ENDOSCOPY;  Service: Endoscopy;  Laterality: N/A;  . CYSTOSCOPY    . ESOPHAGOGASTRODUODENOSCOPY (EGD) WITH PROPOFOL N/A 07/31/2018   Procedure: ESOPHAGOGASTRODUODENOSCOPY (EGD) WITH PROPOFOL;  Surgeon: Lollie Sails, MD;  Location: [facility] ENDOSCOPY;  Service: Endoscopy;  Laterality: N/A;  . laparoscopy    . VAGINAL HYSTERECTOMY  6/13   with morcellation    There were no vitals filed for this visit.    Ther-Ex Education on frequency, mode, rep/set range, and intensity of therex needed for continued strength gains in shoulder/periscapular musculature. Patient completed one set of all of the following with min cuing needed for proper technique, good carry over from previous sessions.  Seated Overhead Press with Dumbbells - 5-10 reps - 3 sets - 1x daily - 2-3x weekly  Standing Bent Over Shoulder Row - 5-10 reps - 3 sets - 1x daily - 2-3x weekly  Standing High Row with Resistance - 5-10 reps - 3 sets - 1x daily - 2-3x weekly  Prone Shoulder  Horizontal Abduction with External Rotation - 5-10 reps - 3 sets - 1x daily - 2-3x weekly  Prone Scapular Retraction Y - 5-10 reps - 3 sets - 1x daily - 2-3x weekly  Push Up Plus - 5-10 reps - 3 sets - 1x daily - 2-3x weekly  Kneeling Plank with Scapular Protraction Retraction AROM - 5-10 reps - 3 sets - 1x daily - 2-3x weekly  Shoulder W - External Rotation with Resistance - 5-10 reps - 3 sets - 1x daily - 2-3x weekly  Standing Shoulder Horizontal Abduction with Resistance - 5-10 reps - 3 sets - 1x daily - 2-3x weekly  Standing Bicep Curls Supinated with Dumbbells - 5-10 reps - 3 sets - 1x daily - 2-3x weekly  Standing Bent Over Triceps Extension - 5-10 reps - 3 sets - 1x daily - 2-3x weekly                            PT Short Term Goals - 01/24/19 1623      PT SHORT TERM GOAL #1   Title  Pt will be independent with HEP in order to improve strength and balance in order to decrease fall risk and improve function at home and work    Baseline  Completes HEP independently    Time  4    Period  Weeks    Status  Achieved  PT Long Term Goals - 01/24/19 1623      PT LONG TERM GOAL #1   Title  Patient will increase FOTO score to 71 to demonstrate predicted increase in functional mobility to complete ADLs    Baseline  01/24/19 78    Time  8    Period  Weeks    Status  Achieved      PT LONG TERM GOAL #2   Title  Patient will demonstrate symmetrical, full gross shoulder AROM in order to return to PLOF and complete ADLs    Baseline  12/27/18 L wnl all motions R: flex 180 ; abd 180d ; IR T10 ; ER CTJ    Time  8    Period  Weeks    Status  Achieved      PT LONG TERM GOAL #3   Title  Patient will demonstrate gross shoulder strength 4+/5 or better to return to PLOF, and to be able to safely complete     Baseline  Gross 5/5 bilat    Time  8    Period  Weeks    Status  Achieved      PT LONG TERM GOAL #4   Title  Pt will decrease worst pain as reported on NPRS by  at least 3 points in order to demonstrate clinically significant reduction in pain.    Baseline  12/27/18 3/10 with strenuous activity    Time  8    Period  Weeks    Status  Achieved              Patient will benefit from skilled therapeutic intervention in order to improve the following deficits and impairments:  Improper body mechanics, Pain, Postural dysfunction, Impaired tone, Decreased activity tolerance, Decreased endurance, Decreased range of motion, Decreased strength, Impaired UE functional use  Visit Diagnosis: Chronic right shoulder pain  Acute pain of right shoulder  Abnormal posture     Problem List Patient Active Problem List   Diagnosis Date Noted  . Reflux esophagitis 08/02/2018  . Right shoulder pain 07/13/2018  . Frozen shoulder syndrome 06/13/2018  . Encounter for preventative adult health care exam with abnormal findings 05/27/2018  . Raynaud phenomenon 05/28/2017  . Generalized anxiety disorder 05/29/2016  . S/P hysterectomy 05/29/2016  . Headache 04/14/2016  . History of mammogram 03/02/2011  . Migraine syndrome   . Hx of colonic polyps   . GERD (gastroesophageal reflux disease) 12/16/2010   Shelton Silvas PT, DPT Shelton Silvas 02/05/2019, 8:29 AM  [facility] [address], [place], [address] Phone: [phone]   Fax:  [phone]  Name: Stephanie Henderson MRN: [id number] Date of Birth: Jan 24, 1966

## 2019-03-12 DIAGNOSIS — Z85828 Personal history of other malignant neoplasm of skin: Secondary | ICD-10-CM | POA: Diagnosis not present

## 2019-03-12 DIAGNOSIS — L820 Inflamed seborrheic keratosis: Secondary | ICD-10-CM | POA: Diagnosis not present

## 2019-03-12 DIAGNOSIS — L821 Other seborrheic keratosis: Secondary | ICD-10-CM | POA: Diagnosis not present

## 2019-04-06 DIAGNOSIS — N951 Menopausal and female climacteric states: Secondary | ICD-10-CM | POA: Diagnosis not present

## 2019-04-06 DIAGNOSIS — R7303 Prediabetes: Secondary | ICD-10-CM | POA: Diagnosis not present

## 2019-04-06 DIAGNOSIS — R5383 Other fatigue: Secondary | ICD-10-CM | POA: Diagnosis not present

## 2019-04-19 DIAGNOSIS — Z566 Other physical and mental strain related to work: Secondary | ICD-10-CM | POA: Diagnosis not present

## 2019-04-19 DIAGNOSIS — K219 Gastro-esophageal reflux disease without esophagitis: Secondary | ICD-10-CM | POA: Diagnosis not present

## 2019-04-19 DIAGNOSIS — N951 Menopausal and female climacteric states: Secondary | ICD-10-CM | POA: Diagnosis not present

## 2019-04-19 DIAGNOSIS — R5383 Other fatigue: Secondary | ICD-10-CM | POA: Diagnosis not present

## 2019-04-19 DIAGNOSIS — R7303 Prediabetes: Secondary | ICD-10-CM | POA: Diagnosis not present

## 2019-05-15 DIAGNOSIS — L989 Disorder of the skin and subcutaneous tissue, unspecified: Secondary | ICD-10-CM

## 2019-05-29 ENCOUNTER — Telehealth: Payer: No Typology Code available for payment source | Admitting: Physician Assistant

## 2019-05-29 DIAGNOSIS — J329 Chronic sinusitis, unspecified: Secondary | ICD-10-CM | POA: Diagnosis not present

## 2019-05-29 DIAGNOSIS — B9689 Other specified bacterial agents as the cause of diseases classified elsewhere: Secondary | ICD-10-CM | POA: Diagnosis not present

## 2019-05-29 MED ORDER — AMOXICILLIN-POT CLAVULANATE 875-125 MG PO TABS: 1.0000 | ORAL_TABLET | Freq: Two times a day (BID) | ORAL | 0 refills | Status: AC

## 2019-05-29 NOTE — Progress Notes (Signed)
We are sorry that you are not feeling well.  Here is how we plan to help!  Based on what you have shared with me it looks like you have sinusitis.  Sinusitis is inflammation and infection in the sinus cavities of the head.  Based on your presentation I believe you most likely have Acute Bacterial Sinusitis.  This is an infection caused by bacteria and is treated with antibiotics. I have prescribed Augmentin 875mg /125mg  one tablet twice daily with food, for 7 days. You may use an oral decongestant such as Mucinex D or if you have glaucoma or high blood pressure use plain Mucinex. Saline nasal spray help and can safely be used as often as needed for congestion.  If you develop worsening sinus pain, fever or notice severe headache and vision changes, or if symptoms are not better after completion of antibiotic, please schedule an appointment with a health care provider.    Try and keep your ears dry but do not stick anything into the ears including Qtips.  Sinus infections are not as easily transmitted as other respiratory infection, however we still recommend that you avoid close contact with loved ones, especially the very young and elderly.  Remember to wash your hands thoroughly throughout the day as this is the number one way to prevent the spread of infection!  Home Care:  Only take medications as instructed by your medical team.  Complete the entire course of an antibiotic.  Do not take these medications with alcohol.  A steam or ultrasonic humidifier can help congestion.  You can place a towel over your head and breathe in the steam from hot water coming from a faucet.  Avoid close contacts especially the very young and the elderly.  Cover your mouth when you cough or sneeze.  Always remember to wash your hands.  Get Help Right Away If:  You develop worsening fever or sinus pain.  You develop a severe head ache or visual changes.  Your symptoms persist after you have completed your  treatment plan.  Make sure you  Understand these instructions.  Will watch your condition.  Will get help right away if you are not doing well or get worse.  Your e-visit answers were reviewed by a board certified advanced clinical practitioner to complete your personal care plan.  Depending on the condition, your plan could have included both over the counter or prescription medications.  If there is a problem please reply  once you have received a response from your provider.  Your safety is important to [place].  If you have drug allergies check your prescription carefully.    You can use MyChart to ask questions about today's visit, request a non-urgent call back, or ask for a work or school excuse for 24 hours related to this e-Visit. If it has been greater than 24 hours you will need to follow up with your provider, or enter a new e-Visit to address those concerns.  You will get an e-mail in the next two days asking about your experience.  I hope that your e-visit has been valuable and will speed your recovery. Thank you for using e-visits.  5 minutes spent on this chart

## 2019-06-26 ENCOUNTER — Ambulatory Visit: Payer: 59 | Admitting: Internal Medicine

## 2019-07-17 ENCOUNTER — Ambulatory Visit (INDEPENDENT_AMBULATORY_CARE_PROVIDER_SITE_OTHER): Payer: No Typology Code available for payment source | Admitting: Internal Medicine

## 2019-07-17 ENCOUNTER — Encounter: Payer: Self-pay | Admitting: Internal Medicine

## 2019-07-17 ENCOUNTER — Other Ambulatory Visit: Payer: Self-pay

## 2019-07-17 VITALS — BP 120/68 | HR 68 | Temp 97.9°F | Resp 14 | Ht 62.0 in | Wt 112.4 lb

## 2019-07-17 DIAGNOSIS — Z0000 Encounter for general adult medical examination without abnormal findings: Secondary | ICD-10-CM | POA: Diagnosis not present

## 2019-07-17 DIAGNOSIS — Z9889 Other specified postprocedural states: Secondary | ICD-10-CM | POA: Diagnosis not present

## 2019-07-17 DIAGNOSIS — Z1231 Encounter for screening mammogram for malignant neoplasm of breast: Secondary | ICD-10-CM | POA: Diagnosis not present

## 2019-07-17 DIAGNOSIS — E538 Deficiency of other specified B group vitamins: Secondary | ICD-10-CM

## 2019-07-17 DIAGNOSIS — G4700 Insomnia, unspecified: Secondary | ICD-10-CM

## 2019-07-17 DIAGNOSIS — Z86010 Personal history of colonic polyps: Secondary | ICD-10-CM

## 2019-07-17 NOTE — Progress Notes (Addendum)
Patient ID: JULL ELIAS, female    DOB: 1966-01-04  Age: 54 y.o. MRN: [id number]  The patient is here for annual  wellness examination and management of other chronic and acute problems.  This visit occurred during the SARS-CoV-2 public health emergency.  Safety protocols were in place, including screening questions prior to the visit, additional usage of staff PPE, and extensive cleaning of exam room while observing appropriate contact time as indicated for disinfecting solutions.     The risk factors are reflected in the social history.  The roster of all physicians providing medical care to patient - is listed in the Snapshot section of the chart.  Activities of daily living:  The patient is 100% independent in all ADLs: dressing, toileting, feeding as well as independent mobility  Home safety : The patient has smoke detectors in the home. They wear seatbelts.  There are no firearms at home. There is no violence in the home.   There is no risks for hepatitis, STDs or HIV. There is no   history of blood transfusion. They have no travel history to infectious disease endemic areas of the world.  The patient has seen their dentist in the last six month. They have seen their eye doctor in the last year. They admit to slight hearing difficulty with regard to whispered voices and some television programs.  They have deferred audiologic testing in the last year.  They do not  have excessive sun exposure. Discussed the need for sun protection: hats, long sleeves and use of sunscreen if there is significant sun exposure.   Diet: the importance of a healthy diet is discussed. They do have a healthy omnivore diet.   The benefits of regular aerobic exercise were discussed. She walks 4 times per week ,  20 minutes.   Depression screen: there are no signs or vegative symptoms of depression- irritability, change in appetite, anhedonia, sadness/tearfullness.   The following portions of the patient's  history were reviewed and updated as appropriate: allergies, current medications, past family history, past medical history,  past surgical history, past social history  and problem list.  Visual acuity was not assessed per patient preference since she has regular follow up with her ophthalmologist. Hearing and body mass index were assessed and reviewed.   During the course of the visit the patient was educated and counseled about appropriate screening and preventive services including : fall prevention , diabetes screening, nutrition counseling, colorectal cancer screening, and recommended immunizations.    CC: The primary encounter diagnosis was Encounter for screening mammogram for malignant neoplasm of breast. Diagnoses of History of benign breast biopsy, B12 deficiency, Encounter for preventive health examination, Insomnia, unspecified type, and Hx of colonic polyps were also pertinent to this visit.  Recently diagnosed with b12 deficiency by screening done at an [facility] clinic .  Has been taking sublingual b12 for several months .   Insomnia , early waking. Patient  has been having some trouble sleeping.  Wakes up 2 to 3 times per night . Bedtime hygiene reviewed,  Patient has not  been using an electronic book to read before bed.  Does not drink caffeinated beverages after 3 PM.  Only voids  bladder once per night.  No snoring partner. Does not drink alcohol to excess.  Not exercising excessively in the evening. Patient does have a history of anxiety but does not lie awake worrying about issues that cannot be resolved. Does not take stimulants. . History Kc has  a past medical history of Fibroid, GERD (gastroesophageal reflux disease) (Aug 2012), Hematuria, colonic polyps (August 2012), Menorrhagia, Migraine headache, and Migraine syndrome.   She has a past surgical history that includes laparoscopy; Cystoscopy; Vaginal hysterectomy (6/13); Breast biopsy (Right, 2017); Colonoscopy  with propofol (N/A, 07/31/2018); and Esophagogastroduodenoscopy (egd) with propofol (N/A, 07/31/2018).   Her family history includes Cancer (age of onset: 76) in her father; Colon cancer in her father; Depression in her father; Hyperlipidemia in her mother; Kidney disease in her daughter; Mental illness in her father and mother.She reports that she has never smoked. She has never used smokeless tobacco. She reports current alcohol use of about 4.0 - 5.0 standard drinks of alcohol per week. She reports that she does not use drugs.  Outpatient Medications Prior to Visit  Medication Sig Dispense Refill  . ALPRAZolam (XANAX) 0.5 MG tablet Take 1 tablet (0.5 mg total) by mouth at bedtime as needed for anxiety. 30 tablet 1  . cyclobenzaprine (FLEXERIL) 10 MG tablet Take 1 tablet (10 mg total) by mouth at bedtime. 30 tablet 11  . diclofenac sodium (VOLTAREN) 1 % GEL Apply 2 g topically 4 (four) times daily. To affected joint. 100 g 11  . famotidine (PEPCID) 40 MG tablet Take 1 tablet (40 mg total) by mouth daily. 90 tablet 1  . pantoprazole (PROTONIX) 40 MG tablet Take 1 tablet (40 mg total) by mouth daily. 90 tablet 2  . sucralfate (CARAFATE) 1 G tablet Take 1 g by mouth 3 (three) times daily as needed.      . SUMAtriptan (IMITREX) 100 MG tablet Take 1 tablet (100 mg total) by mouth once for 1 dose. May repeat in 2 hours if needed 10 tablet 5  . diazepam (VALIUM) 5 MG tablet One tab by mouth, 2 hours before procedure. 2 tablet 0  . fluticasone (FLONASE) 50 MCG/ACT nasal spray Place 1 spray into both nostrils 2 (two) times daily for 10 days. 1 g 0  . levofloxacin (LEVAQUIN) 500 MG tablet Take 1 tablet (500 mg total) by mouth daily. (Patient not taking: Reported on 07/17/2019) 7 tablet 0  . Vitamin D, Ergocalciferol, (DRISDOL) 1.25 MG (50000 UT) CAPS capsule Take 1 capsule (50,000 Units total) by mouth every 7 (seven) days. 12 capsule 0   No facility-administered medications prior to visit.    Review of  Systems   Patient denies headache, fevers, malaise, unintentional weight loss, skin rash, eye pain, sinus congestion and sinus pain, sore throat, dysphagia,  hemoptysis , cough, dyspnea, wheezing, chest pain, palpitations, orthopnea, edema, abdominal pain, nausea, melena, diarrhea, constipation, flank pain, dysuria, hematuria, urinary  Frequency, nocturia, numbness, tingling, seizures,  Focal weakness, Loss of consciousness,  Tremor, insomnia, depression, anxiety, and suicidal ideation.     Objective:  BP 120/68 (BP Location: Left Arm, Patient Position: Sitting, Cuff Size: Normal)   Pulse 68   Temp 97.9 F (36.6 C) (Temporal)   Resp 14   Ht 5\' 2"  (1.575 m)   Wt 112 lb 6.4 oz (51 kg)   LMP 02/20/2011   SpO2 98%   BMI 20.56 kg/m   Physical Exam   General appearance: alert, cooperative and appears stated age Head: Normocephalic, without obvious abnormality, atraumatic Eyes: conjunctivae/corneas clear. PERRL, EOM's intact. Fundi benign. Ears: normal TM's and external ear canals both ears Nose: Nares normal. Septum midline. Mucosa normal. No drainage or sinus tenderness. Throat: lips, mucosa, and tongue normal; teeth and gums normal Neck: no adenopathy, no carotid bruit, no JVD, supple,  symmetrical, trachea midline and thyroid not enlarged, symmetric, no tenderness/mass/nodules Lungs: clear to auscultation bilaterally Breasts: normal appearance, no masses or tenderness Heart: regular rate and rhythm, S1, S2 normal, no murmur, click, rub or gallop Abdomen: soft, non-tender; bowel sounds normal; no masses,  no organomegaly Extremities: extremities normal, atraumatic, no cyanosis or edema Pulses: 2+ and symmetric Skin: Skin color, texture, turgor normal. No rashes or lesions Neurologic: Alert and oriented X 3, normal strength and tone. Normal symmetric reflexes. Normal coordination and gait.      Assessment & Plan:   Problem List Items Addressed This Visit      Unprioritized   B12  deficiency    Reported by patient by outside testing and managed with sublingual supplementation.  Folate is normal,  Intrinsic factor is pending   Lab Results  Component Value Date   VITAMINB12 391 07/17/2019   .      Relevant Orders   B12 and Folate Panel (Completed)   Intrinsic Factor Antibodies   Encounter for preventive health examination    age appropriate education and counseling updated, referrals for preventative services and immunizations addressed, dietary and smoking counseling addressed, most recent labs reviewed.  I have personally reviewed and have noted:  1) the patient's medical and social history 2) The pt's use of alcohol, tobacco, and illicit drugs 3) The patient's current medications and supplements 4) Functional ability including ADL's, fall risk, home safety risk, hearing and visual impairment 5) Diet and physical activities 6) Evidence for depression or mood disorder 7) The patient's height, weight, and BMI have been recorded in the chart  I have made referrals, and provided counseling and education based on review of the above      History of benign breast biopsy   Hx of colonic polyps    Continue  5 yr follow up for for recurrent tubular adenoma      Insomnia    Discussed natural remedies for insomnia including herbal tea and melatonin.  Reviewd principles of good sleep hygiene       Other Visit Diagnoses    Encounter for screening mammogram for malignant neoplasm of breast    -  Primary   Relevant Orders   MM DIGITAL SCREENING BILATERAL      I have discontinued Lattie Haw T. Simenson's fluticasone, Vitamin D (Ergocalciferol), levofloxacin, and diazepam. I am also having her maintain her sucralfate, SUMAtriptan, ALPRAZolam, cyclobenzaprine, pantoprazole, famotidine, and diclofenac sodium.  No orders of the defined types were placed in this encounter.   Medications Discontinued During This Encounter  Medication Reason  . fluticasone (FLONASE) 50  MCG/ACT nasal spray Completed Course  . Vitamin D, Ergocalciferol, (DRISDOL) 1.25 MG (50000 UT) CAPS capsule Completed Course  . diazepam (VALIUM) 5 MG tablet Completed Course  . levofloxacin (LEVAQUIN) 500 MG tablet Completed Course    Follow-up: No follow-ups on file.   Crecencio Mc, MD

## 2019-07-17 NOTE — Patient Instructions (Signed)
You can try melatonin (increase dose gradually to 5 mg max dose) for the sleep issues  Take it at the same time every evening. 9pm   If no improvement,  We can try [place]   Your annual mammogram has been ordered and is due now.  You can schedule our own   You can take 3000 Ius of D3 daily,  You can reduce dose to 2000 ius during the summer months   If the b12 is low,  We'll have you get a few injections

## 2019-07-18 DIAGNOSIS — G4700 Insomnia, unspecified: Secondary | ICD-10-CM | POA: Insufficient documentation

## 2019-07-18 DIAGNOSIS — E538 Deficiency of other specified B group vitamins: Secondary | ICD-10-CM | POA: Insufficient documentation

## 2019-07-18 LAB — B12 AND FOLATE PANEL
Folate: 14.9 ng/mL (ref >5.9)
Vitamin B-12: 391 pg/mL (ref 211–911)

## 2019-07-18 NOTE — Assessment & Plan Note (Addendum)
Reported by patient by outside testing and managed with sublingual supplementation.  Folate is normal,  Intrinsic factor is pending   Lab Results  Component Value Date   VITAMINB12 391 07/17/2019   .

## 2019-07-18 NOTE — Assessment & Plan Note (Signed)
Continue  5 yr follow up for for recurrent tubular adenoma

## 2019-07-18 NOTE — Assessment & Plan Note (Signed)

## 2019-07-18 NOTE — Assessment & Plan Note (Signed)
Discussed natural remedies for insomnia including herbal tea and melatonin.  Reviewd principles of good sleep hygiene 

## 2019-07-19 LAB — INTRINSIC FACTOR ANTIBODIES: Intrinsic Factor: NEGATIVE

## 2019-07-23 ENCOUNTER — Other Ambulatory Visit: Payer: Self-pay | Admitting: Internal Medicine

## 2019-08-16 ENCOUNTER — Ambulatory Visit
Admission: RE | Admit: 2019-08-16 | Discharge: 2019-08-16 | Disposition: A | Discharge status: Home / Self Care | Payer: No Typology Code available for payment source | Source: Ambulatory Visit | Attending: Internal Medicine | Admitting: Internal Medicine

## 2019-08-16 ENCOUNTER — Other Ambulatory Visit: Payer: Self-pay

## 2019-08-16 DIAGNOSIS — Z1231 Encounter for screening mammogram for malignant neoplasm of breast: Secondary | ICD-10-CM

## 2019-08-17 ENCOUNTER — Other Ambulatory Visit: Payer: Self-pay | Admitting: Internal Medicine

## 2019-08-17 DIAGNOSIS — R928 Other abnormal and inconclusive findings on diagnostic imaging of breast: Secondary | ICD-10-CM

## 2019-08-29 ENCOUNTER — Ambulatory Visit
Admission: RE | Admit: 2019-08-29 | Discharge: 2019-08-29 | Disposition: A | Discharge status: Home / Self Care | Payer: No Typology Code available for payment source | Source: Ambulatory Visit | Attending: Internal Medicine | Admitting: Internal Medicine

## 2019-08-29 ENCOUNTER — Other Ambulatory Visit: Payer: Self-pay

## 2019-08-29 ENCOUNTER — Ambulatory Visit: Payer: No Typology Code available for payment source

## 2019-08-29 DIAGNOSIS — R928 Other abnormal and inconclusive findings on diagnostic imaging of breast: Secondary | ICD-10-CM

## 2019-10-23 ENCOUNTER — Encounter: Payer: Self-pay | Admitting: Internal Medicine

## 2019-10-23 ENCOUNTER — Ambulatory Visit (INDEPENDENT_AMBULATORY_CARE_PROVIDER_SITE_OTHER): Payer: No Typology Code available for payment source

## 2019-10-23 ENCOUNTER — Ambulatory Visit (INDEPENDENT_AMBULATORY_CARE_PROVIDER_SITE_OTHER): Payer: No Typology Code available for payment source | Admitting: Internal Medicine

## 2019-10-23 ENCOUNTER — Other Ambulatory Visit: Payer: Self-pay

## 2019-10-23 VITALS — BP 116/72 | HR 83 | Temp 98.5°F | Resp 14 | Ht 62.0 in | Wt 114.0 lb

## 2019-10-23 DIAGNOSIS — M545 Low back pain: Secondary | ICD-10-CM

## 2019-10-23 DIAGNOSIS — G8929 Other chronic pain: Secondary | ICD-10-CM

## 2019-10-23 DIAGNOSIS — M533 Sacrococcygeal disorders, not elsewhere classified: Secondary | ICD-10-CM

## 2019-10-23 MED ORDER — TIZANIDINE HCL 4 MG PO TABS: 4.0000 mg | ORAL_TABLET | Freq: Four times a day (QID) | ORAL | 0 refills | Status: DC | PRN

## 2019-10-23 NOTE — Progress Notes (Signed)
Subjective:  Patient ID: Stephanie Henderson, female    DOB: 1966/03/04  Age: 54 y.o. MRN: [id number]  CC: The primary encounter diagnosis was Chronic SI joint pain. A diagnosis of Chronic bilateral low back pain without sciatica was also pertinent to this visit.  HPI Stephanie Henderson presents for evaluation of lower back pain.  This visit occurred during the SARS-CoV-2 public health emergency.  Safety protocols were in place, including screening questions prior to the visit, additional usage of staff PPE, and extensive cleaning of exam room while observing appropriate contact time as indicated for disinfecting solutions.   Her back pain is not result of a fall or unusual lifing incident , Initially noted it 1-2 times per week when lying on back. However she has been working the [facility] and has been sitting in an unusual position for 8 hours daily and feels that this may have contributed to the problems.  Pain is now present in lower back and tailbone  when sitting down , and lower back  feels tight after sitting for long periods of time  Walking feels great   Outpatient Medications Prior to Visit  Medication Sig Dispense Refill  . ALPRAZolam (XANAX) 0.5 MG tablet Take 1 tablet (0.5 mg total) by mouth at bedtime as needed for anxiety. 30 tablet 1  . cyclobenzaprine (FLEXERIL) 10 MG tablet Take 1 tablet (10 mg total) by mouth at bedtime. 30 tablet 11  . diclofenac sodium (VOLTAREN) 1 % GEL Apply 2 g topically 4 (four) times daily. To affected joint. 100 g 11  . famotidine (PEPCID) 40 MG tablet Take 1 tablet (40 mg total) by mouth daily. 90 tablet 1  . pantoprazole (PROTONIX) 40 MG tablet TAKE 1 TABLET (40 MG TOTAL) BY MOUTH DAILY. 90 tablet 2  . sucralfate (CARAFATE) 1 G tablet Take 1 g by mouth 3 (three) times daily as needed.      . SUMAtriptan (IMITREX) 100 MG tablet Take 1 tablet (100 mg total) by mouth once for 1 dose. May repeat in 2 hours if needed 10 tablet 5   No  facility-administered medications prior to visit.    Review of Systems;  Patient denies headache, fevers, malaise, unintentional weight loss, skin rash, eye pain, sinus congestion and sinus pain, sore throat, dysphagia,  hemoptysis , cough, dyspnea, wheezing, chest pain, palpitations, orthopnea, edema, abdominal pain, nausea, melena, diarrhea, constipation, flank pain, dysuria, hematuria, urinary  Frequency, nocturia, numbness, tingling, seizures,  Focal weakness, Loss of consciousness,  Tremor, insomnia, depression, anxiety, and suicidal ideation.      Objective:  BP 116/72 (BP Location: Left Arm, Patient Position: Sitting, Cuff Size: Normal)   Pulse 83   Temp 98.5 F (36.9 C) (Temporal)   Resp 14   Ht 5\' 2"  (1.575 m)   Wt 114 lb (51.7 kg)   LMP 02/20/2011   SpO2 98%   BMI 20.85 kg/m   BP Readings from Last 3 Encounters:  10/23/19 116/72  07/17/19 120/68  07/31/18 94/67    Wt Readings from Last 3 Encounters:  10/23/19 114 lb (51.7 kg)  07/17/19 112 lb 6.4 oz (51 kg)  07/31/18 106 lb (48.1 kg)    General appearance: alert, cooperative and appears stated age Ears: normal TM's and external ear canals both ears Throat: lips, mucosa, and tongue normal; teeth and gums normal Neck: no adenopathy, no carotid bruit, supple, symmetrical, trachea midline and thyroid not enlarged, symmetric, no tenderness/mass/nodules Back: symmetric, no curvature. ROM normal. No  CVA tenderness. Lungs: clear to auscultation bilaterally Heart: regular rate and rhythm, S1, S2 normal, no murmur, click, rub or gallop Abdomen: soft, non-tender; bowel sounds normal; no masses,  no organomegaly Pulses: 2+ and symmetric Skin: Skin color, texture, turgor normal. No rashes or lesions Lymph nodes: Cervical, supraclavicular, and axillary nodes normal. MSK:   Spinal tenderness and SI joint tenderness noted bilaterally Full ROM,  Negative straight leg lift.  No foot drop  . No bruising to spine  Lab Results    Component Value Date   HGBA1C 5.4 11/16/2018   HGBA1C 5.8 05/25/2018   HGBA1C 5.6 05/25/2017    Lab Results  Component Value Date   CREATININE 0.55 05/25/2018   CREATININE 0.58 05/25/2017   CREATININE 0.57 01/14/2015    Lab Results  Component Value Date   WBC 8.3 05/25/2018   HGB 12.7 05/25/2018   HCT 38.5 05/25/2018   PLT 263.0 05/25/2018   GLUCOSE 83 05/25/2018   CHOL 194 05/25/2018   TRIG 45.0 05/25/2018   HDL 60.50 05/25/2018   LDLCALC 125 (H) 05/25/2018   ALT 43 (H) 05/25/2018   AST 32 05/25/2018   NA 137 05/25/2018   K 4.1 05/25/2018   CL 102 05/25/2018   CREATININE 0.55 05/25/2018   BUN 10 05/25/2018   CO2 29 05/25/2018   TSH 1.69 05/27/2016   HGBA1C 5.4 11/16/2018    MM DIAG BREAST TOMO UNI RIGHT  Result Date: 08/29/2019 CLINICAL DATA:  Possible mass in the posterior aspect of the central right breast in the oblique projection of a recent screening mammogram. EXAM: DIGITAL DIAGNOSTIC UNILATERAL RIGHT MAMMOGRAM WITH CAD AND TOMO COMPARISON:  Previous exam(s). ACR Breast Density Category c: The breast tissue is heterogeneously dense, which may obscure small masses. FINDINGS: 3D tomographic and 2D generated true lateral and spot compression oblique views of the right breast demonstrate normal appearing fibroglandular tissue at the location of recently suspected asymmetry, unchanged compared to previous examinations. Mammographic images were processed with CAD. IMPRESSION: No evidence of malignancy. The recently suspected right breast mass was close apposition of normal breast tissue. RECOMMENDATION: Bilateral screening mammogram in 1 year. I have discussed the findings and recommendations with the patient. If applicable, a reminder letter will be sent to the patient regarding the next appointment. BI-RADS CATEGORY  1: Negative. Electronically Signed   By: Claudie Revering M.D.   On: 08/29/2019 14:52    Assessment & Plan:   Problem List Items Addressed This Visit       Unprioritized   Chronic bilateral low back pain without sciatica    Plain films of lumbar spine and sacrum ordered today  To rule out lytic bone lesions and evaluate alignment since pain has been getting worse over the  last 2 weeks .  PT recommended       Relevant Medications   tiZANidine (ZANAFLEX) 4 MG tablet   Other Relevant Orders   DG Lumbar Spine Complete   Ambulatory referral to Physical Therapy   Chronic SI joint pain - Primary    Plain films of  sacrum ordered today  To evaluate for arthritic changes since pain has been getting worse over the  last 2 weeks .  PT recommended        Relevant Medications   tiZANidine (ZANAFLEX) 4 MG tablet   Other Relevant Orders   DG Sacrum/Coccyx   Ambulatory referral to Physical Therapy      I am having Yatziri T. Sherley start on tiZANidine. I am  also having her maintain her sucralfate, SUMAtriptan, ALPRAZolam, cyclobenzaprine, famotidine, diclofenac sodium, and pantoprazole.  Meds ordered this encounter  Medications  . tiZANidine (ZANAFLEX) 4 MG tablet    Sig: Take 1 tablet (4 mg total) by mouth every 6 (six) hours as needed for muscle spasms.    Dispense:  30 tablet    Refill:  0   I provided  30 minutes of  face-to-face time during this encounter reviewing patient's current problems and past surgeries, labs and imaging studies, providing counseling on the above mentioned problems , and coordination  of care .  There are no discontinued medications.  Follow-up: No follow-ups on file.   Crecencio Mc, MD

## 2019-10-23 NOTE — Patient Instructions (Signed)
Trial of tizanidine with or without Aleve.   PT referral    Sacroiliac Joint Dysfunction  Sacroiliac joint dysfunction is a condition that causes inflammation on one or both sides of the sacroiliac (SI) joint. The SI joint connects the lower part of the spine (sacrum) with the two upper portions of the pelvis (ilium). This condition causes deep aching or burning pain in the low back. In some cases, the pain may also spread into one or both buttocks, hips, or thighs. What are the causes? This condition may be caused by:  Pregnancy. During pregnancy, extra stress is put on the SI joints because the pelvis widens.  Injury, such as: ? Injuries from car accidents. ? Sports-related injuries. ? Work-related injuries.  Having one leg that is shorter than the other.  Conditions that affect the joints, such as: ? Rheumatoid arthritis. ? Gout. ? Psoriatic arthritis. ? Joint infection (septic arthritis). Sometimes, the cause of SI joint dysfunction is not known. What are the signs or symptoms? Symptoms of this condition include:  Aching or burning pain in the lower back. The pain may also spread to other areas, such as: ? Buttocks. ? Groin. ? Thighs.  Muscle spasms in or around the painful areas.  Increased pain when standing, walking, running, stair climbing, bending, or lifting. How is this diagnosed? This condition is diagnosed with a physical exam and medical history. During the exam, the health care provider may move one or both of your legs to different positions to check for pain. Various tests may be done to confirm the diagnosis, including:  Imaging tests to look for other causes of pain. These may include: ? MRI. ? CT scan. ? Bone scan.  Diagnostic injection. A numbing medicine is injected into the SI joint using a needle. If your pain is temporarily improved or stopped after the injection, this can indicate that SI joint dysfunction is the problem. How is this  treated? Treatment depends on the cause and severity of your condition. Treatment options may include:  Ice or heat applied to the lower back area after an injury. This may help reduce pain and muscle spasms.  Medicines to relieve pain or inflammation or to relax the muscles.  Wearing a back brace (sacroiliac brace) to help support the joint while your back is healing.  Physical therapy to increase muscle strength around the joint and flexibility at the joint. This may also involve learning proper body positions and ways of moving to relieve stress on the joint.  Direct manipulation of the SI joint.  Injections of steroid medicine into the joint to reduce pain and swelling.  Radiofrequency ablation to burn away nerves that are carrying pain messages from the joint.  Use of a device that provides electrical stimulation to help reduce pain at the joint.  Surgery to put in screws and plates that limit or prevent joint motion. This is rare. Follow these instructions at home: Medicines  Take over-the-counter and prescription medicines only as told by your health care provider.  Do not drive or use heavy machinery while taking prescription pain medicine.  If you are taking prescription pain medicine, take actions to prevent or treat constipation. Your health care provider may recommend that you: ? Drink enough fluid to keep your urine pale yellow. ? Eat foods that are high in fiber, such as fresh fruits and vegetables, whole grains, and beans. ? Limit foods that are high in fat and processed sugars, such as fried or sweet foods. ?  Take an over-the-counter or prescription medicine for constipation. If you have a brace:  Wear the brace as told by your health care provider. Remove it only as told by your health care provider.  Keep the brace clean.  If the brace is not waterproof: ? Do not let it get wet. ? Cover it with a watertight covering when you take a bath or a shower. Managing  pain, stiffness, and swelling      Icing can help with pain and swelling. Heat may help with muscle tension or spasms. Ask your health care provider if you should use ice or heat.  If directed, put ice on the affected area: ? If you have a removable brace, remove it as told by your health care provider. ? Put ice in a plastic bag. ? Place a towel between your skin and the bag. ? Leave the ice on for 20 minutes, 2-3 times a day.  If directed, apply heat to the affected area. Use the heat source that your health care provider recommends, such as a moist heat pack or a heating pad. ? Place a towel between your skin and the heat source. ? Leave the heat on for 20-30 minutes. ? Remove the heat if your skin turns bright red. This is especially important if you are unable to feel pain, heat, or cold. You may have a greater risk of getting burned. General instructions  Rest as needed. Ask your health care provider what activities are safe for you.  Return to your normal activities as told by your health care provider.  Exercise as directed by your health care provider or physical therapist.  Do not use any products that contain nicotine or tobacco, such as cigarettes and e-cigarettes. These can delay bone healing. If you need help quitting, ask your health care provider.  Keep all follow-up visits as told by your health care provider. This is important. Contact a health care provider if:  Your pain is not controlled with medicine.  You have a fever.  Your pain is getting worse. Get help right away if:  You have weakness, numbness, or tingling in your legs or feet.  You lose control of your bladder or bowel. Summary  Sacroiliac joint dysfunction is a condition that causes inflammation on one or both sides of the sacroiliac (SI) joint.  This condition causes deep aching or burning pain in the low back. In some cases, the pain may also spread into one or both buttocks, hips, or  thighs.  Treatment depends on the cause and severity of your condition. It may include medicines to reduce pain and swelling or to relax muscles. This information is not intended to replace advice given to you by your health care provider. Make sure you discuss any questions you have with your health care provider. Document Revised: 12/28/2017 Document Reviewed: 06/13/2017 Elsevier Patient Education  2020 [employer].

## 2019-10-24 DIAGNOSIS — M545 Low back pain, unspecified: Secondary | ICD-10-CM | POA: Insufficient documentation

## 2019-10-24 DIAGNOSIS — M533 Sacrococcygeal disorders, not elsewhere classified: Secondary | ICD-10-CM | POA: Insufficient documentation

## 2019-10-24 DIAGNOSIS — G8929 Other chronic pain: Secondary | ICD-10-CM | POA: Insufficient documentation

## 2019-10-24 NOTE — Assessment & Plan Note (Signed)
Plain films of lumbar spine and sacrum ordered today  To rule out lytic bone lesions and evaluate alignment since pain has been getting worse over the  last 2 weeks .  PT recommended

## 2019-10-24 NOTE — Assessment & Plan Note (Signed)
Plain films of  sacrum ordered today  To evaluate for arthritic changes since pain has been getting worse over the  last 2 weeks .  PT recommended

## 2019-10-25 ENCOUNTER — Ambulatory Visit: Payer: No Typology Code available for payment source | Attending: Internal Medicine | Admitting: Physical Therapy

## 2019-10-25 ENCOUNTER — Encounter: Payer: Self-pay | Admitting: Physical Therapy

## 2019-10-25 ENCOUNTER — Other Ambulatory Visit: Payer: Self-pay

## 2019-10-25 DIAGNOSIS — M545 Low back pain, unspecified: Secondary | ICD-10-CM

## 2019-10-25 DIAGNOSIS — M533 Sacrococcygeal disorders, not elsewhere classified: Secondary | ICD-10-CM | POA: Insufficient documentation

## 2019-10-25 DIAGNOSIS — M25511 Pain in right shoulder: Secondary | ICD-10-CM | POA: Diagnosis present

## 2019-10-25 DIAGNOSIS — R293 Abnormal posture: Secondary | ICD-10-CM | POA: Diagnosis present

## 2019-10-25 DIAGNOSIS — G8929 Other chronic pain: Secondary | ICD-10-CM | POA: Insufficient documentation

## 2019-10-25 NOTE — Therapy (Addendum)
[place] [facility] [address], [place], [address] Phone: [phone]   Fax:  [phone]  Physical Therapy Evaluation  Patient Details  Name: Stephanie Henderson MRN: [id number] Date of Birth: April 09, 1966 No data recorded  Encounter Date: 10/25/2019   PT End of Session - 10/25/19 1644    Visit Number 1    Number of Visits 17    Date for PT Re-Evaluation 01/26/19    PT Start Time 0400    PT Stop Time 0455    PT Time Calculation (min) 55 min    Activity Tolerance Patient tolerated treatment well    Behavior During Therapy [facility] for tasks assessed/performed           Past Medical History:  Diagnosis Date  . Fibroid   . GERD (gastroesophageal reflux disease) Aug 2012   with esophagitis by EGD  . Hematuria   . Hx of colonic polyps August 2012   repeat due 2016,  5 polyps 2009, clear 2012 Gustavo Lah)  . Menorrhagia    did not tolerate trial of ocps due to migraines  . Migraine headache   . Migraine syndrome    since age 10,  sporadic    Past Surgical History:  Procedure Laterality Date  . BREAST BIOPSY Right 2017   benign  . COLONOSCOPY WITH PROPOFOL N/A 07/31/2018   Procedure: COLONOSCOPY WITH PROPOFOL;  Surgeon: Lollie Sails, MD;  Location: [facility] ENDOSCOPY;  Service: Endoscopy;  Laterality: N/A;  . CYSTOSCOPY    . ESOPHAGOGASTRODUODENOSCOPY (EGD) WITH PROPOFOL N/A 07/31/2018   Procedure: ESOPHAGOGASTRODUODENOSCOPY (EGD) WITH PROPOFOL;  Surgeon: Lollie Sails, MD;  Location: [facility] ENDOSCOPY;  Service: Endoscopy;  Laterality: N/A;  . laparoscopy    . VAGINAL HYSTERECTOMY  6/13   with morcellation    There were no vitals filed for this visit.   Subjective Assessment - 10/25/19 1600    Subjective --    Pertinent History Pt is a 54 year old female reporting with bilat SIJ/LBP, that she reports started a month ago, and would be on and off over several weeks. 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 Over the past month she has pain with sitting for any period of time in any chair. Reports pain is right across posterior pelvis in soft chairs, and is where her glute meets her lower leg. Pain is achy in nature, but can be sharp on L side if she turns to the left in her car. Worst pain over the past 2 weeks 8/10 best 2/10. 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 Standing and walking improves her pain, though she can feel it. Pt denies N/V, B&B changes, unexplained weight fluctuation, saddle paresthesia, fever, night sweats, or unrelenting night pain at this time.    Limitations Sitting    How long can you sit comfortably? unable    How long can you stand comfortably? unlimited, can be a little achy    How long can you walk comfortably? unlimited, can be a little achy    Diagnostic tests Xrays slight lumbar levoscolosis    Patient Stated Goals  Be able to sit without pain    Currently in Pain? Yes    Pain Score 5     Pain Location Hip    Pain Orientation Left;Right    Pain Descriptors / Indicators Dull;Aching;Sharp    Pain Type Chronic pain    Pain Radiating Towards none    Pain Onset More than a month ago    Pain Frequency Intermittent    Aggravating Factors  sitting    Pain Relieving Factors heat    Effect of Pain on Daily Activities unable to sit to commute or complete documentation            OBJECTIVE  Mental Status Patient is oriented to person, place and time.  Recent memory is intact.  Remote memory is intact.  Attention span and concentration are intact.  Expressive speech is intact.  Patient's fund of knowledge is within normal limits for  educational level.  SENSATION: Grossly intact to light touch bilateral LEs as determined by testing dermatomes L2-S2 Proprioception and hot/cold testing deferred on this date   MUSCULOSKELETAL: Tremor: None Bulk: Normal Tone: Normal No visible step-off along spinal column  Posture Lumbar lordosis: Increased Iliac crest height: equal bilaterally Lumbar lateral shift: negative Lower crossed syndrome (tight hip flexors and erector spinae; weak gluts and abs): positive  Gait Normalized gait with slightly increase in lumbar lordosis   Palpation TTP with concordant sign over bilat SIJ L>R; TTP along L superior glute fibers with palpable trigger points. Tension in bilat lumbar paraspinals with latent trigger points  Strength (out of 5) R/L 5/5 Hip flexion 5/5 Hip ER 5/4+ Hip IR 5/5 Hip abduction 5/5 Hip adduction 4+/4+ Hip extension 5/5 Knee extension 5/5 Knee flexion 5/5 Ankle dorsiflexion 5/5 Ankle plantarflexion 5 Trunk flexion 5 Trunk extension 5/5 Trunk rotation  *Indicates pain   AROM (degrees) R/L (all movements include overpressure unless otherwise stated) Lumbar forward flexion (65): WNL with pain at bilat SIJ Lumbar extension (30):  WNL with pain at bilat SIJ Lumbar lateral flexion (25): WNL bilat Thoracic and Lumbar rotation (30 degrees):  WNL with pain at L SIJ with L roation All hip motions WNL with some discomfort with bilat hip IR *Indicates pain   PROM (degrees) PROM = AROM   Repeated Movements No centralization or peripheralization of symptoms with repeated lumbar extension or flexion.    Muscle Length Hamstrings: Normal bilat Ely: Normal bilat Thomas: shortened bilat Ober: Normal bilat   Passive Accessory Intervertebral Motion (PAIVM) Pt denies reproduction of back pain with CPA L1-L5 and UPA bilaterally L1-L5. Generally hypomobile throughout  Passive Physiological Intervertebral Motion (PPIVM) Normal flexion and extension with PPIVM  testing   SPECIAL TESTS Lumbar Radiculopathy and Discogenic: Centralization and Peripheralization (SN 92, -LR 0.12): Negative  Slump (SN 83, -LR 0.32): Negative bilat SLR (SN 92, -LR 0.29): Negative bilat  Facet Joint: Extension-Rotation (SN 100, -LR 0.0):Negative bilat  Lumbar Spinal Stenosis: Lumbar quadrant (SN 70): Negative   Hip: FABER (SN 81): Positive bilat FADIR (SN 94): Positive bilat Hip scour (SN 50):Negative bilat  SIJ:  Distraction: Positive  Compression: Positive L>R Thigh Thrust : R: Negative L: Positive Sacral Thrust: Positive Gaenslen's: Negative bilat  Piriformis Syndrome: FAIR Test (SN 88, SP 83): Negative bilat  Functional Tasks Squat: normal, mild increased lumbar lordosis Plank: 35sec  Ther-Ex PT reviewed the following HEP with patient with patient able to demonstrate a set of the following with min cuing for correction needed. PT educated patient on parameters of therex (  how/when to inc/decrease intensity, frequency, rep/set range, stretch hold time, and purpose of therex) with verbalized understanding. PT utilized anatomical models and imaging to ensure understanding of length tension relationships of the spine/pelvis.  Access Code: [id number] Standard Plank - 1 x daily - 7 x weekly - 10 reps - 3 sets Supine Bridge - 1 x daily - 7 x weekly - 10 reps - 3 sets Child's Pose Stretch - 3 x daily - 7 x weekly - 30sec hold RICE protocol  ASSESSMENT Clinical Impression: Pt is a pleasant year-old female/female referred for low back pain. PT examination reveals deficits . Pt presents with deficits in strength, mobility, range of motion, and pain. Pt will benefit from skilled PT services to address deficits and return to pain-free function at home and work.       PT Short Term Goals - 10/25/19 1719      PT SHORT TERM GOAL #1   Title Pt will be independent with HEP in order to improve strength and decrease pain in order to improve function at home and work     Baseline 10/25/19 HEP given    Time 4    Period Weeks             PT Long Term Goals - 10/25/19 1722      PT LONG TERM GOAL #1   Title Patient will increase FOTO score to 73 to demonstrate predicted increase in functional mobility to complete ADLs    Baseline 10/25/19 57    Time 8    Period Weeks    Status New      PT LONG TERM GOAL #2   Title Pt will decrease worst back pain as reported on NPRS by at least 2 points in order to demonstrate clinically significant reduction in back pain.    Baseline 10/25/19 8/10 bilat SIJ pain    Time 8    Period Weeks    Status New      PT LONG TERM GOAL #3   Title Patient will be able to sit for 43min with pain not exceeding 2/10 in order to be able to complete documentation    Baseline 10/25/19 8/10 pain with any sitting    Time 8    Period Weeks    Status New                 Plan - 10/25/19 1711    Clinical Impression Statement Pt is a 54 year old female presenting with bilat SIJ pain L>R; signs and symptoms of SIJ dysfunction. Impairments in SIJ pain, decreased motor control, decreased core stabilization strength, and postural dysfunction. Activity limitations in prolonged sitting, sleeping, rotating; inhibiting participation in driving, and completing documentation at work without pain. Pt will benefit from skilled PT to address impairments and return to optimal PLOF.    Personal Factors and Comorbidities Past/Current Experience;Fitness;Time since onset of injury/illness/exacerbation    Examination-Activity Limitations Sleep;Sit;Locomotion Level    Examination-Participation Restrictions Cleaning;Community Activity;Driving;Volunteer    Stability/Clinical Decision Making Evolving/Moderate complexity    Clinical Decision Making Moderate    Rehab Potential Good    PT Frequency 2x / week    PT Duration 8 weeks    PT Treatment/Interventions Moist Heat;Traction;Ultrasound;Cryotherapy;Fluidtherapy;Electrical Stimulation;Therapeutic  exercise;Taping;Patient/family education;Therapeutic activities;Functional mobility training;Neuromuscular re-education;Passive range of motion;Dry needling;Manual techniques;Visual/perceptual remediation/compensation;Aquatic Therapy;Spinal Manipulations;Joint Manipulations;DME Instruction;Gait training;Balance training;Stair training    PT Next Visit Plan HEP review, TDN?    PT Home Exercise Plan childs pose (wide), plank, bridge  Consulted and Agree with Plan of Care Patient           Patient will benefit from skilled therapeutic intervention in order to improve the following deficits and impairments:  Improper body mechanics, Pain, Postural dysfunction, Impaired tone, Decreased activity tolerance, Decreased endurance, Decreased range of motion, Decreased strength, Impaired UE functional use, Abnormal gait, Increased fascial restricitons, Decreased mobility, Difficulty walking, Impaired flexibility  Visit Diagnosis: Chronic right shoulder pain  Acute pain of right shoulder  Abnormal posture     Problem List Patient Active Problem List   Diagnosis Date Noted  . Chronic SI joint pain 10/24/2019  . Chronic bilateral low back pain without sciatica 10/24/2019  . Insomnia 07/18/2019  . B12 deficiency 07/18/2019  . History of benign breast biopsy 07/17/2019  . Reflux esophagitis 08/02/2018  . Right shoulder pain 07/13/2018  . Frozen shoulder syndrome 06/13/2018  . Encounter for preventive health examination 05/27/2018  . Raynaud phenomenon 05/28/2017  . Generalized anxiety disorder 05/29/2016  . S/P hysterectomy 05/29/2016  . Headache 04/14/2016  . History of mammogram 03/02/2011  . Migraine syndrome   . Hx of colonic polyps   . GERD (gastroesophageal reflux disease) 12/16/2010   Durwin Reges DPT Durwin Reges 10/25/2019, 5:30 PM  [place] [facility] [address], [place], [address] Phone: [phone]    Fax:  [phone]  Name: Stephanie Henderson MRN: [id number] Date of Birth: 10-29-1965

## 2019-10-30 ENCOUNTER — Encounter: Payer: Self-pay | Admitting: Physical Therapy

## 2019-10-30 ENCOUNTER — Ambulatory Visit: Payer: No Typology Code available for payment source | Admitting: Physical Therapy

## 2019-10-30 ENCOUNTER — Other Ambulatory Visit: Payer: Self-pay

## 2019-10-30 DIAGNOSIS — M533 Sacrococcygeal disorders, not elsewhere classified: Secondary | ICD-10-CM | POA: Diagnosis not present

## 2019-10-30 DIAGNOSIS — M545 Low back pain, unspecified: Secondary | ICD-10-CM

## 2019-10-30 DIAGNOSIS — R293 Abnormal posture: Secondary | ICD-10-CM

## 2019-10-30 NOTE — Therapy (Addendum)
[place] [facility] [address] [place], [address] Phone: [phone]   Fax:  [phone]  Physical Therapy Treatment  Patient Details  Name: Stephanie Henderson MRN: [id number] Date of Birth: [birth year] No data recorded  Encounter Date: 10/30/2019    Past Medical History:  Diagnosis Date  . Fibroid   . GERD (gastroesophageal reflux disease) Aug 2012   with esophagitis by EGD  . Hematuria   . Hx of colonic polyps August 2012   repeat due 2016,  5 polyps 2009, clear 2012 Gustavo Lah)  . Menorrhagia    did not tolerate trial of ocps due to migraines  . Migraine headache   . Migraine syndrome    since age 62,  sporadic    Past Surgical History:  Procedure Laterality Date  . BREAST BIOPSY Right 2017   benign  . COLONOSCOPY WITH PROPOFOL N/A 07/31/2018   Procedure: COLONOSCOPY WITH PROPOFOL;  Surgeon: Lollie Sails, MD;  Location: [facility] ENDOSCOPY;  Service: Endoscopy;  Laterality: N/A;  . CYSTOSCOPY    . ESOPHAGOGASTRODUODENOSCOPY (EGD) WITH PROPOFOL N/A 07/31/2018   Procedure: ESOPHAGOGASTRODUODENOSCOPY (EGD) WITH PROPOFOL;  Surgeon: Lollie Sails, MD;  Location: [facility] ENDOSCOPY;  Service: Endoscopy;  Laterality: N/A;  . laparoscopy    . VAGINAL HYSTERECTOMY  6/13   with morcellation    There were no vitals filed for this visit.        Manual STM with trigger point release to bilat superior glute max/med fibers;increased time spent on L side Sidelying G1-2 mobilization for anterior innominate rotation of the pelvis 4x 30sec bouts;  Contract/Relax hip flexion in sidelying 10sec on/30sec overpressure into ext   Ther-Ex SKTC 30sec bilat Supine butterfly stretch 2x 30sec Bridge review x10; SL bridge 2x 6 each LE with min cuing initially for proper glute contraction with good carry over following Sitting EOB postural correction with core contraction x47min; sitting on ball 1imn Sitting on ball TA  marches 2x 10 with heavy cuing initially with excellent carry over Plank review with min cuing for post pelvic tilt with excellent carry over 20sec hold                         PT Short Term Goals - 10/25/19 1719      PT SHORT TERM GOAL #1   Title Pt will be independent with HEP in order to improve strength and decrease pain in order to improve function at home and work    Baseline 10/25/19 HEP given    Time 4    Period Weeks             PT Long Term Goals - 10/25/19 1722      PT LONG TERM GOAL #1   Title Patient will increase FOTO score to 73 to demonstrate predicted increase in functional mobility to complete ADLs    Baseline 10/25/19 57    Time 8    Period Weeks    Status New      PT LONG TERM GOAL #2   Title Pt will decrease worst back pain as reported on NPRS by at least 2 points in order to demonstrate clinically significant reduction in back pain.    Baseline 10/25/19 8/10 bilat SIJ pain    Time 8    Period Weeks    Status New      PT LONG TERM GOAL #3   Title Patient will be able to  sit for 36min with pain not exceeding 2/10 in order to be able to complete documentation    Baseline 10/25/19 8/10 pain with any sitting    Time 8    Period Weeks    Status New                  Patient will benefit from skilled therapeutic intervention in order to improve the following deficits and impairments:  Improper body mechanics, Pain, Postural dysfunction, Impaired tone, Decreased activity tolerance, Decreased endurance, Decreased range of motion, Decreased strength, Impaired UE functional use, Abnormal gait, Increased fascial restricitons, Decreased mobility, Difficulty walking, Impaired flexibility  Visit Diagnosis: Sacrococcygeal disorders, not elsewhere classified  Abnormal posture  Acute bilateral low back pain without sciatica     Problem List Patient Active Problem List   Diagnosis Date Noted  . Chronic SI joint pain 10/24/2019  .  Chronic bilateral low back pain without sciatica 10/24/2019  . Insomnia 07/18/2019  . B12 deficiency 07/18/2019  . History of benign breast biopsy 07/17/2019  . Reflux esophagitis 08/02/2018  . Right shoulder pain 07/13/2018  . Frozen shoulder syndrome 06/13/2018  . Encounter for preventive health examination 05/27/2018  . Raynaud phenomenon 05/28/2017  . Generalized anxiety disorder 05/29/2016  . S/P hysterectomy 05/29/2016  . Headache 04/14/2016  . History of mammogram 03/02/2011  . Migraine syndrome   . Hx of colonic polyps   . GERD (gastroesophageal reflux disease) 12/16/2010   Durwin Reges DPT Durwin Reges 11/01/2019, 9:58 AM  [place] [facility] [address], [place], [address] Phone: [phone]   Fax:  [phone]  Name: Stephanie Henderson MRN: [id number] Date of Birth: July 31, 1965

## 2019-11-01 ENCOUNTER — Encounter: Payer: Self-pay | Admitting: Physical Therapy

## 2019-11-01 ENCOUNTER — Other Ambulatory Visit: Payer: Self-pay

## 2019-11-01 ENCOUNTER — Ambulatory Visit: Payer: No Typology Code available for payment source | Admitting: Physical Therapy

## 2019-11-01 DIAGNOSIS — R293 Abnormal posture: Secondary | ICD-10-CM

## 2019-11-01 DIAGNOSIS — M533 Sacrococcygeal disorders, not elsewhere classified: Secondary | ICD-10-CM

## 2019-11-01 DIAGNOSIS — M545 Low back pain, unspecified: Secondary | ICD-10-CM

## 2019-11-01 NOTE — Addendum Note (Signed)
Addended by: Kelton Pillar on: 11/01/2019 09:58 AM   Modules accepted: Orders

## 2019-11-01 NOTE — Therapy (Signed)
[place] [facility] [address] [place], [address] Phone: [phone]   Fax:  [phone]  Physical Therapy Treatment  Patient Details  Name: Stephanie Henderson MRN: [id number] Date of Birth: 09/14/1965 No data recorded  Encounter Date: 11/01/2019   PT End of Session - 11/01/19 1609    Visit Number 3    Number of Visits 17    Date for PT Re-Evaluation 01/26/19    PT Start Time 0315    PT Stop Time 0409    PT Time Calculation (min) 54 min    Activity Tolerance Patient tolerated treatment well    Behavior During Therapy [facility] for tasks assessed/performed           Past Medical History:  Diagnosis Date  . Fibroid   . GERD (gastroesophageal reflux disease) Aug 2012   with esophagitis by EGD  . Hematuria   . Hx of colonic polyps August 2012   repeat due 2016,  5 polyps 2009, clear 2012 Gustavo Lah)  . Menorrhagia    did not tolerate trial of ocps due to migraines  . Migraine headache   . Migraine syndrome    since age 71,  sporadic    Past Surgical History:  Procedure Laterality Date  . BREAST BIOPSY Right 2017   benign  . COLONOSCOPY WITH PROPOFOL N/A 07/31/2018   Procedure: COLONOSCOPY WITH PROPOFOL;  Surgeon: Lollie Sails, MD;  Location: [facility] ENDOSCOPY;  Service: Endoscopy;  Laterality: N/A;  . CYSTOSCOPY    . ESOPHAGOGASTRODUODENOSCOPY (EGD) WITH PROPOFOL N/A 07/31/2018   Procedure: ESOPHAGOGASTRODUODENOSCOPY (EGD) WITH PROPOFOL;  Surgeon: Lollie Sails, MD;  Location: [facility] ENDOSCOPY;  Service: Endoscopy;  Laterality: N/A;  . laparoscopy    . VAGINAL HYSTERECTOMY  6/13   with morcellation    There were no vitals filed for this visit.   Subjective Assessment - 11/01/19 1521    Subjective Patient reports minimal hip pain today 4/10, reports she felt better after last session. She reports she is working on her sitting posture and she thinks it is working.    Pertinent History Pt is a 54 year  old female reporting with bilat SIJ/LBP, that she reports started a month ago, and would be on and off over several weeks. 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 Over the past month she has pain with sitting for any period of time in any chair. Reports pain is right across posterior pelvis in soft chairs, and is where her glute meets her lower leg. Pain is achy in nature, but can be sharp on L side if she turns to the left in her car. Worst pain over the past 2 weeks 8/10 best 2/10. 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 Standing and walking improves her pain, though she can feel it. Pt denies N/V, B&B changes, unexplained weight fluctuation, saddle paresthesia, fever, night sweats, or unrelenting night pain at this time.    Limitations Sitting    How long can you sit comfortably? unable    How long can you stand comfortably? unlimited, can be a little achy  How long can you walk comfortably? unlimited, can be a little achy    Diagnostic tests Xrays slight lumbar levoscolosis    Patient Stated Goals Be able to sit without pain    Pain Onset More than a month ago           Manual STM with trigger point release to bilat superior glute max/med fibers;increased time spent on L side Sidelying G1-2 mobilization for anterior innominate rotation of the pelvis 4x 30sec bouts;  Contract/Relax hip flexion in sidelying 10sec on/30sec overpressure into ext  Contract/Relax L hip flex into ext/ R hip ext into flex 10sec on/30sec relax x4 Hooklying traction on ball x24min with rotations x2min  Ther-Ex Qped alt hip ext x10; alt UE/LE 2x 6/8 with cone for TC to maintain core  contraction with good carry over  ER bridge GTB 3x 10 with min cuing to maintain ER with good carry over Sitting on ball sit 22min with decent carry over from last session Siting on ball oblique twists 2x 10 with 5# DB with minA to prevent LOB                          PT Education - 11/01/19 1557    Education Details HEP review, therex form    Person(s) Educated Patient    Methods Explanation;Tactile cues;Demonstration;Verbal cues    Comprehension Verbalized understanding;Returned demonstration;Verbal cues required;Tactile cues required            PT Short Term Goals - 10/25/19 1719      PT SHORT TERM GOAL #1   Title Pt will be independent with HEP in order to improve strength and decrease pain in order to improve function at home and work    Baseline 10/25/19 HEP given    Time 4    Period Weeks             PT Long Term Goals - 10/25/19 1722      PT LONG TERM GOAL #1   Title Patient will increase FOTO score to 73 to demonstrate predicted increase in functional mobility to complete ADLs    Baseline 10/25/19 57    Time 8    Period Weeks    Status New      PT LONG TERM GOAL #2   Title Pt will decrease worst back pain as reported on NPRS by at least 2 points in order to demonstrate clinically significant reduction in back pain.    Baseline 10/25/19 8/10 bilat SIJ pain    Time 8    Period Weeks    Status New      PT LONG TERM GOAL #3   Title Patient will be able to sit for 38min with pain not exceeding 2/10 in order to be able to complete documentation    Baseline 10/25/19 8/10 pain with any sitting    Time 8    Period Weeks    Status New                 Plan - 11/01/19 1610    Clinical Impression Statement PT spent increased time with manual techniques this session to reduce pain, and muscle tension; and to improve joint mobility with success. PT continue to utilize therex for increased postural stability and lumbopelvic mobility with good  carry over of all cuing and demonstrations. PT will continue progression as able.    Personal Factors and Comorbidities Past/Current Experience;Fitness;Time since onset of injury/illness/exacerbation  Examination-Activity Limitations Sleep;Sit;Locomotion Level    Examination-Participation Restrictions Cleaning;Community Activity;Driving;Volunteer    Stability/Clinical Decision Making Evolving/Moderate complexity    Clinical Decision Making Moderate    Rehab Potential Good    Clinical Impairments Affecting Rehab Potential (+) fairly yong age, motivation (-) Sedentary lifestyle, chronicity of pain, hypersensitivity, psycho-social factors    PT Frequency 2x / week    PT Duration 8 weeks    PT Treatment/Interventions Moist Heat;Traction;Ultrasound;Cryotherapy;Fluidtherapy;Electrical Stimulation;Therapeutic exercise;Taping;Patient/family education;Therapeutic activities;Functional mobility training;Neuromuscular re-education;Passive range of motion;Dry needling;Manual techniques;Visual/perceptual remediation/compensation;Aquatic Therapy;Spinal Manipulations;Joint Manipulations;DME Instruction;Gait training;Balance training;Stair training    PT Next Visit Plan HEP review, TDN?    PT Home Exercise Plan childs pose (wide), plank, bridge    Consulted and Agree with Plan of Care Patient           Patient will benefit from skilled therapeutic intervention in order to improve the following deficits and impairments:  Improper body mechanics, Pain, Postural dysfunction, Impaired tone, Decreased activity tolerance, Decreased endurance, Decreased range of motion, Decreased strength, Impaired UE functional use, Abnormal gait, Increased fascial restricitons, Decreased mobility, Difficulty walking, Impaired flexibility  Visit Diagnosis: Abnormal posture  Sacrococcygeal disorders, not elsewhere classified  Acute bilateral low back pain without sciatica     Problem List Patient Active Problem List    Diagnosis Date Noted  . Chronic SI joint pain 10/24/2019  . Chronic bilateral low back pain without sciatica 10/24/2019  . Insomnia 07/18/2019  . B12 deficiency 07/18/2019  . History of benign breast biopsy 07/17/2019  . Reflux esophagitis 08/02/2018  . Right shoulder pain 07/13/2018  . Frozen shoulder syndrome 06/13/2018  . Encounter for preventive health examination 05/27/2018  . Raynaud phenomenon 05/28/2017  . Generalized anxiety disorder 05/29/2016  . S/P hysterectomy 05/29/2016  . Headache 04/14/2016  . History of mammogram 03/02/2011  . Migraine syndrome   . Hx of colonic polyps   . GERD (gastroesophageal reflux disease) 12/16/2010   Durwin Reges DPT Durwin Reges 11/01/2019, 4:26 PM  [place] [facility] [address] [place], [address] Phone: [phone]   Fax:  [phone]  Name: Sion Thane MRN: [id number] Date of Birth: 12/09/1965

## 2019-11-02 ENCOUNTER — Ambulatory Visit: Payer: No Typology Code available for payment source | Admitting: Physical Therapy

## 2019-11-08 ENCOUNTER — Encounter: Payer: Self-pay | Admitting: Physical Therapy

## 2019-11-08 ENCOUNTER — Ambulatory Visit: Payer: No Typology Code available for payment source | Admitting: Physical Therapy

## 2019-11-08 ENCOUNTER — Other Ambulatory Visit: Payer: Self-pay

## 2019-11-08 DIAGNOSIS — M533 Sacrococcygeal disorders, not elsewhere classified: Secondary | ICD-10-CM

## 2019-11-08 DIAGNOSIS — R293 Abnormal posture: Secondary | ICD-10-CM

## 2019-11-08 DIAGNOSIS — M545 Low back pain, unspecified: Secondary | ICD-10-CM

## 2019-11-08 NOTE — Therapy (Signed)
[place] [facility] [address], [place], [address] Phone: [phone]   Fax:  [phone]  Physical Therapy Treatment  Patient Details  Name: Stephanie Henderson MRN: [id number] Date of Birth: 11-Jan-1966 No data recorded  Encounter Date: 11/08/2019   PT End of Session - 11/08/19 1634    Visit Number 4    Number of Visits 17    Date for PT Re-Evaluation 01/26/19    PT Start Time 0315    PT Stop Time 0400    PT Time Calculation (min) 45 min    Activity Tolerance Patient tolerated treatment well    Behavior During Therapy [facility] for tasks assessed/performed           Past Medical History:  Diagnosis Date  . Fibroid   . GERD (gastroesophageal reflux disease) Aug 2012   with esophagitis by EGD  . Hematuria   . Hx of colonic polyps August 2012   repeat due 2016,  5 polyps 2009, clear 2012 Gustavo Lah)  . Menorrhagia    did not tolerate trial of ocps due to migraines  . Migraine headache   . Migraine syndrome    since age 64,  sporadic    Past Surgical History:  Procedure Laterality Date  . BREAST BIOPSY Right 2017   benign  . COLONOSCOPY WITH PROPOFOL N/A 07/31/2018   Procedure: COLONOSCOPY WITH PROPOFOL;  Surgeon: Lollie Sails, MD;  Location: [facility] ENDOSCOPY;  Service: Endoscopy;  Laterality: N/A;  . CYSTOSCOPY    . ESOPHAGOGASTRODUODENOSCOPY (EGD) WITH PROPOFOL N/A 07/31/2018   Procedure: ESOPHAGOGASTRODUODENOSCOPY (EGD) WITH PROPOFOL;  Surgeon: Lollie Sails, MD;  Location: [facility] ENDOSCOPY;  Service: Endoscopy;  Laterality: N/A;  . laparoscopy    . VAGINAL HYSTERECTOMY  6/13   with morcellation    There were no vitals filed for this visit.   Subjective Assessment - 11/08/19 1520    Subjective Pt reports she is feeling pretty good after therapy all weekend, then did bear crawl at the gym Saturday and had increased pain with this, that has subsided now. REports she feels good today without  medication, which she is happy with. Has been working on posture.    Pertinent History Pt is a 54 year old female reporting with bilat SIJ/LBP, that she reports started a month ago, and would be on and off over several weeks. 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 Over the past month she has pain with sitting for any period of time in any chair. Reports pain is right across posterior pelvis in soft chairs, and is where her glute meets her lower leg. Pain is achy in nature, but can be sharp on L side if she turns to the left in her car. Worst pain over the past 2 weeks 8/10 best 2/10. 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 Standing and walking improves her pain, though she can feel it. Pt denies N/V, B&B changes, unexplained weight fluctuation, saddle paresthesia, fever, night sweats, or unrelenting night pain at this time.    Limitations Sitting    How long can you sit comfortably? unable  How long can you stand comfortably? unlimited, can be a little achy    How long can you walk comfortably? unlimited, can be a little achy    Diagnostic tests Xrays slight lumbar levoscolosis    Patient Stated Goals Be able to sit without pain    Pain Onset More than a month ago              Manual STM withtrigger point releaseto bilat superior glute max/med fibers;increased time spent on L side Sidelying G1-2 mobilization foranteriorinnominaterotation of the pelvis 4x 30sec bouts; same for posterior rotation Contract/Relax L hip flex into ext/ R hip ext into flex 10sec on/30sec relax x4 Hooklying traction on ball x98min with rotations x59min  Ther-Ex ER bridge GTB 3x 10 with  min cuing to maintain ER with good carry over Plank with opposite LE ext 2x 10 with min cuing for set up with good carry over Wall sit with alt LE hip flex 2x 10 with cuing for set up posture with good carry over                        PT Education - 11/08/19 1633    Education Details therex form/technique    Person(s) Educated Patient    Methods Explanation;Demonstration;Verbal cues    Comprehension Verbalized understanding;Returned demonstration;Verbal cues required            PT Short Term Goals - 10/25/19 1719      PT SHORT TERM GOAL #1   Title Pt will be independent with HEP in order to improve strength and decrease pain in order to improve function at home and work    Baseline 10/25/19 HEP given    Time 4    Period Weeks             PT Long Term Goals - 10/25/19 1722      PT LONG TERM GOAL #1   Title Patient will increase FOTO score to 73 to demonstrate predicted increase in functional mobility to complete ADLs    Baseline 10/25/19 57    Time 8    Period Weeks    Status New      PT LONG TERM GOAL #2   Title Pt will decrease worst back pain as reported on NPRS by at least 2 points in order to demonstrate clinically significant reduction in back pain.    Baseline 10/25/19 8/10 bilat SIJ pain    Time 8    Period Weeks    Status New      PT LONG TERM GOAL #3   Title Patient will be able to sit for 71min with pain not exceeding 2/10 in order to be able to complete documentation    Baseline 10/25/19 8/10 pain with any sitting    Time 8    Period Weeks    Status New                  Patient will benefit from skilled therapeutic intervention in order to improve the following deficits and impairments:     Visit Diagnosis: Abnormal posture  Sacrococcygeal disorders, not elsewhere classified  Acute bilateral low back pain without sciatica     Problem List Patient Active Problem List   Diagnosis Date Noted  . Chronic SI joint pain  10/24/2019  . Chronic bilateral low back pain without sciatica 10/24/2019  . Insomnia 07/18/2019  . B12 deficiency 07/18/2019  . History of benign breast biopsy 07/17/2019  .  Reflux esophagitis 08/02/2018  . Right shoulder pain 07/13/2018  . Frozen shoulder syndrome 06/13/2018  . Encounter for preventive health examination 05/27/2018  . Raynaud phenomenon 05/28/2017  . Generalized anxiety disorder 05/29/2016  . S/P hysterectomy 05/29/2016  . Headache 04/14/2016  . History of mammogram 03/02/2011  . Migraine syndrome   . Hx of colonic polyps   . GERD (gastroesophageal reflux disease) 12/16/2010   Durwin Reges DPT  Durwin Reges 11/08/2019, 4:39 PM  [place] [facility] [address] [place], [address] Phone: [phone]   Fax:  [phone]  Name: Christyne Mccain MRN: [id number] Date of Birth: 01/15/1966

## 2019-11-13 ENCOUNTER — Other Ambulatory Visit: Payer: Self-pay

## 2019-11-13 ENCOUNTER — Ambulatory Visit: Payer: No Typology Code available for payment source | Admitting: Physical Therapy

## 2019-11-13 ENCOUNTER — Encounter: Payer: Self-pay | Admitting: Physical Therapy

## 2019-11-13 DIAGNOSIS — M545 Low back pain, unspecified: Secondary | ICD-10-CM

## 2019-11-13 DIAGNOSIS — G8929 Other chronic pain: Secondary | ICD-10-CM

## 2019-11-13 DIAGNOSIS — R293 Abnormal posture: Secondary | ICD-10-CM

## 2019-11-13 DIAGNOSIS — M25511 Pain in right shoulder: Secondary | ICD-10-CM

## 2019-11-13 DIAGNOSIS — M533 Sacrococcygeal disorders, not elsewhere classified: Secondary | ICD-10-CM

## 2019-11-13 NOTE — Therapy (Signed)
[place] [facility] [address], [place], [address] Phone: [phone]   Fax:  [phone]  Physical Therapy Treatment  Patient Details  Name: Stephanie Henderson MRN: [id number] Date of Birth: 14-Dec-1965 No data recorded  Encounter Date: 11/13/2019   PT End of Session - 11/13/19 1630    Visit Number 5    Number of Visits 17    Date for PT Re-Evaluation 03/16/19    PT Start Time 0400    PT Stop Time 0445    PT Time Calculation (min) 45 min    Activity Tolerance Patient tolerated treatment well    Behavior During Therapy [facility] for tasks assessed/performed           Past Medical History:  Diagnosis Date  . Fibroid   . GERD (gastroesophageal reflux disease) Aug 2012   with esophagitis by EGD  . Hematuria   . Hx of colonic polyps August 2012   repeat due 2016,  5 polyps 2009, clear 2012 Gustavo Lah)  . Menorrhagia    did not tolerate trial of ocps due to migraines  . Migraine headache   . Migraine syndrome    since age 7,  sporadic    Past Surgical History:  Procedure Laterality Date  . BREAST BIOPSY Right 2017   benign  . COLONOSCOPY WITH PROPOFOL N/A 07/31/2018   Procedure: COLONOSCOPY WITH PROPOFOL;  Surgeon: Lollie Sails, MD;  Location: [facility] ENDOSCOPY;  Service: Endoscopy;  Laterality: N/A;  . CYSTOSCOPY    . ESOPHAGOGASTRODUODENOSCOPY (EGD) WITH PROPOFOL N/A 07/31/2018   Procedure: ESOPHAGOGASTRODUODENOSCOPY (EGD) WITH PROPOFOL;  Surgeon: Lollie Sails, MD;  Location: [facility] ENDOSCOPY;  Service: Endoscopy;  Laterality: N/A;  . laparoscopy    . VAGINAL HYSTERECTOMY  6/13   with morcellation    There were no vitals filed for this visit.   Subjective Assessment - 11/13/19 1603    Subjective Patient reports she is continuing to feel better overall, reporting 2/10 pain today. Reports L sided SIJ pain Friday-Saturday, but does not recall change in activity.    Pertinent History Pt is a 53 year  old female reporting with bilat SIJ/LBP, that she reports started a month ago, and would be on and off over several weeks. 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 Over the past month she has pain with sitting for any period of time in any chair. Reports pain is right across posterior pelvis in soft chairs, and is where her glute meets her lower leg. Pain is achy in nature, but can be sharp on L side if she turns to the left in her car. Worst pain over the past 2 weeks 8/10 best 2/10. 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 Standing and walking improves her pain, though she can feel it. Pt denies N/V, B&B changes, unexplained weight fluctuation, saddle paresthesia, fever, night sweats, or unrelenting night pain at this time.    Limitations Sitting    How long can you sit comfortably? unable    How long can you stand comfortably? unlimited, can be a little achy    How long  can you walk comfortably? unlimited, can be a little achy    Diagnostic tests Xrays slight lumbar levoscolosis    Patient Stated Goals Be able to sit without pain    Pain Onset More than a month ago            Manual STM withtrigger point releaseto bilat superior glute max/med fibers;increased time spent on L side Sidelying G1-2 mobilization foranteriorinnominaterotation of the pelvis 4x 30sec bouts; same for posterior rotation  Ther-Ex Seated piriformis stretch 30sec hold bilat Alt bird dog 3x 10 with TC for setup with good carry over following  Squat with 5# DB outstretched 2x 10 with excellent carry over of proper form following demo Static bear crawl with slider 2x 10 with good carry over of demo  min cuing for maintained core activation with good carry over                          PT Education - 11/13/19 1627    Education Details therex form/technique    Person(s) Educated Patient    Methods Explanation;Demonstration;Verbal cues    Comprehension Verbalized understanding;Returned demonstration;Verbal cues required            PT Short Term Goals - 10/25/19 1719      PT SHORT TERM GOAL #1   Title Pt will be independent with HEP in order to improve strength and decrease pain in order to improve function at home and work    Baseline 10/25/19 HEP given    Time 4    Period Weeks             PT Long Term Goals - 10/25/19 1722      PT LONG TERM GOAL #1   Title Patient will increase FOTO score to 73 to demonstrate predicted increase in functional mobility to complete ADLs    Baseline 10/25/19 57    Time 8    Period Weeks    Status New      PT LONG TERM GOAL #2   Title Pt will decrease worst back pain as reported on NPRS by at least 2 points in order to demonstrate clinically significant reduction in back pain.    Baseline 10/25/19 8/10 bilat SIJ pain    Time 8    Period Weeks    Status New      PT LONG TERM GOAL #3   Title Patient will be able to sit for 28min with pain not exceeding 2/10 in order to be able to complete documentation    Baseline 10/25/19 8/10 pain with any sitting    Time 8    Period Weeks    Status New                 Plan - 11/13/19 1648    Clinical Impression Statement PT continued manual techniques to reduce pain and muscle tension with success. PT continued therex progression for core and hip stability with good carry over of all demo and cuing given, with good motivation throughout session. PT will continue progression as able.    Personal Factors and Comorbidities Past/Current Experience;Fitness;Time since onset of injury/illness/exacerbation    Examination-Activity Limitations Sleep;Sit;Locomotion Level     Stability/Clinical Decision Making Evolving/Moderate complexity    Clinical Decision Making Moderate    Rehab Potential Good    Clinical Impairments Affecting Rehab Potential (+) fairly yong age, motivation (-) Sedentary lifestyle, chronicity of pain, hypersensitivity, psycho-social factors    PT  Frequency 2x / week    PT Duration 8 weeks    PT Treatment/Interventions Moist Heat;Traction;Ultrasound;Cryotherapy;Fluidtherapy;Electrical Stimulation;Therapeutic exercise;Taping;Patient/family education;Therapeutic activities;Functional mobility training;Neuromuscular re-education;Passive range of motion;Dry needling;Manual techniques;Visual/perceptual remediation/compensation;Aquatic Therapy;Spinal Manipulations;Joint Manipulations;DME Instruction;Gait training;Balance training;Stair training    PT Next Visit Plan HEP review, TDN?    PT Home Exercise Plan childs pose (wide), plank, bridge    Consulted and Agree with Plan of Care Patient           Patient will benefit from skilled therapeutic intervention in order to improve the following deficits and impairments:  Improper body mechanics, Pain, Postural dysfunction, Impaired tone, Decreased activity tolerance, Decreased endurance, Decreased range of motion, Decreased strength, Impaired UE functional use, Abnormal gait, Increased fascial restricitons, Decreased mobility, Difficulty walking, Impaired flexibility  Visit Diagnosis: Abnormal posture  Sacrococcygeal disorders, not elsewhere classified  Acute bilateral low back pain without sciatica  Chronic right shoulder pain  Acute pain of right shoulder     Problem List Patient Active Problem List   Diagnosis Date Noted  . Chronic SI joint pain 10/24/2019  . Chronic bilateral low back pain without sciatica 10/24/2019  . Insomnia 07/18/2019  . B12 deficiency 07/18/2019  . History of benign breast biopsy 07/17/2019  . Reflux esophagitis 08/02/2018  . Right shoulder pain 07/13/2018  .  Frozen shoulder syndrome 06/13/2018  . Encounter for preventive health examination 05/27/2018  . Raynaud phenomenon 05/28/2017  . Generalized anxiety disorder 05/29/2016  . S/P hysterectomy 05/29/2016  . Headache 04/14/2016  . History of mammogram 03/02/2011  . Migraine syndrome   . Hx of colonic polyps   . GERD (gastroesophageal reflux disease) 12/16/2010   Durwin Reges DPT Durwin Reges 11/13/2019, 4:52 PM  [place] [facility] [address] [place], [address] Phone: [phone]   Fax:  [phone]  Name: Saquoia Sianez MRN: [id number] Date of Birth: 16-Jun-1965

## 2019-11-14 ENCOUNTER — Ambulatory Visit: Payer: No Typology Code available for payment source | Admitting: Physical Therapy

## 2019-11-15 ENCOUNTER — Telehealth: Payer: No Typology Code available for payment source | Admitting: Emergency Medicine

## 2019-11-15 DIAGNOSIS — J329 Chronic sinusitis, unspecified: Secondary | ICD-10-CM | POA: Diagnosis not present

## 2019-11-15 MED ORDER — AMOXICILLIN-POT CLAVULANATE 875-125 MG PO TABS
1.0000 | ORAL_TABLET | Freq: Two times a day (BID) | ORAL | 0 refills | Status: DC
Start: 2019-11-15 — End: 2020-01-22

## 2019-11-15 NOTE — Progress Notes (Signed)

## 2019-11-16 ENCOUNTER — Encounter: Payer: Self-pay | Admitting: Physical Therapy

## 2019-11-16 ENCOUNTER — Ambulatory Visit: Payer: No Typology Code available for payment source | Attending: Internal Medicine | Admitting: Physical Therapy

## 2019-11-16 ENCOUNTER — Other Ambulatory Visit: Payer: Self-pay

## 2019-11-16 DIAGNOSIS — M533 Sacrococcygeal disorders, not elsewhere classified: Secondary | ICD-10-CM | POA: Insufficient documentation

## 2019-11-16 DIAGNOSIS — M25511 Pain in right shoulder: Secondary | ICD-10-CM | POA: Diagnosis present

## 2019-11-16 DIAGNOSIS — M545 Low back pain, unspecified: Secondary | ICD-10-CM

## 2019-11-16 DIAGNOSIS — G8929 Other chronic pain: Secondary | ICD-10-CM | POA: Diagnosis present

## 2019-11-16 DIAGNOSIS — R293 Abnormal posture: Secondary | ICD-10-CM | POA: Diagnosis not present

## 2019-11-16 NOTE — Therapy (Signed)
[place] [facility] [address], [place], [address] Phone: [phone]   Fax:  [phone]  Physical Therapy Treatment  Patient Details  Name: Stephanie Henderson MRN: [id number] Date of Birth: Jan 01, 1966 No data recorded  Encounter Date: 11/16/2019   PT End of Session - 11/16/19 1047    Visit Number 6    Number of Visits 17    Date for PT Re-Evaluation 03/16/19    PT Start Time 1000    PT Stop Time 1040    PT Time Calculation (min) 40 min    Activity Tolerance Patient tolerated treatment well    Behavior During Therapy [facility] for tasks assessed/performed           Past Medical History:  Diagnosis Date   Fibroid    GERD (gastroesophageal reflux disease) Aug 2012   with esophagitis by EGD   Hematuria    Hx of colonic polyps August 2012   repeat due 2016,  5 polyps 2009, clear 2012 ([place])   Menorrhagia    did not tolerate trial of ocps due to migraines   Migraine headache    Migraine syndrome    since age 78,  sporadic    Past Surgical History:  Procedure Laterality Date   BREAST BIOPSY Right 2017   benign   COLONOSCOPY WITH PROPOFOL N/A 07/31/2018   Procedure: COLONOSCOPY WITH PROPOFOL;  Surgeon: Lollie Sails, MD;  Location: [facility] ENDOSCOPY;  Service: Endoscopy;  Laterality: N/A;   CYSTOSCOPY     ESOPHAGOGASTRODUODENOSCOPY (EGD) WITH PROPOFOL N/A 07/31/2018   Procedure: ESOPHAGOGASTRODUODENOSCOPY (EGD) WITH PROPOFOL;  Surgeon: Lollie Sails, MD;  Location: [facility] ENDOSCOPY;  Service: Endoscopy;  Laterality: N/A;   laparoscopy     VAGINAL HYSTERECTOMY  6/13   with morcellation    There were no vitals filed for this visit.   Subjective Assessment - 11/16/19 1002    Subjective Patient reports she felt good wed/thurs, and this morning woke up sore with 3/10 pain L>R.    Pertinent History Pt is a 54 year old female reporting with bilat SIJ/LBP, that she reports started a month ago,  and would be on and off over several weeks. 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 Over the past month she has pain with sitting for any period of time in any chair. Reports pain is right across posterior pelvis in soft chairs, and is where her glute meets her lower leg. Pain is achy in nature, but can be sharp on L side if she turns to the left in her car. Worst pain over the past 2 weeks 8/10 best 2/10. 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 Standing and walking improves her pain, though she can feel it. Pt denies N/V, B&B changes, unexplained weight fluctuation, saddle paresthesia, fever, night sweats, or unrelenting night pain at this time.    Limitations Sitting    How long can you sit comfortably? unable    How long can you stand comfortably? unlimited, can be a little achy    How long can you walk comfortably? unlimited, can be a little achy  Diagnostic tests Xrays slight lumbar levoscolosis    Patient Stated Goals Be able to sit without pain    Pain Onset More than a month ago             Manual STM withtrigger point releaseto bilat superior glute max/med fibers;increased time spent on L side Sidelying G1-2 mobilization foranteriorinnominaterotation of the pelvis 4x 30sec bouts;same for posterior rotation Traction x10 10sec on/10sec off; with rotation x20  Ther-Ex Seated piriformis stretch 30sec hold bilat Squat with 5# DB in front 2x 10 with cuing to prevent over-ext with neutral spine with weight in front with good carry over Static bear crawl with slider 2x 10 with good carry over of demo min cuing for maintained core activation with good carry  over                         PT Education - 11/16/19 1045    Education Details therex form/technique    Person(s) Educated Patient    Methods Explanation;Demonstration;Verbal cues    Comprehension Verbalized understanding;Returned demonstration;Verbal cues required            PT Short Term Goals - 10/25/19 1719      PT SHORT TERM GOAL #1   Title Pt will be independent with HEP in order to improve strength and decrease pain in order to improve function at home and work    Baseline 10/25/19 HEP given    Time 4    Period Weeks             PT Long Term Goals - 10/25/19 1722      PT LONG TERM GOAL #1   Title Patient will increase FOTO score to 73 to demonstrate predicted increase in functional mobility to complete ADLs    Baseline 10/25/19 57    Time 8    Period Weeks    Status New      PT LONG TERM GOAL #2   Title Pt will decrease worst back pain as reported on NPRS by at least 2 points in order to demonstrate clinically significant reduction in back pain.    Baseline 10/25/19 8/10 bilat SIJ pain    Time 8    Period Weeks    Status New      PT LONG TERM GOAL #3   Title Patient will be able to sit for 25min with pain not exceeding 2/10 in order to be able to complete documentation    Baseline 10/25/19 8/10 pain with any sitting    Time 8    Period Weeks    Status New                 Plan - 11/16/19 1047    Clinical Impression Statement PT spent prolonged time with manual techniques to reduce pain with good success. Patient reports 1/10 pain following manual interventions. PT continued therex for functional movement and core/hip activation with pt able to demonstrate and verbalize understanding of this.    Personal Factors and Comorbidities Past/Current Experience;Fitness;Time since onset of injury/illness/exacerbation    Examination-Activity Limitations Sleep;Sit;Locomotion Level    Examination-Participation Restrictions Cleaning;Community  Activity;Driving;Volunteer    Stability/Clinical Decision Making Evolving/Moderate complexity    Clinical Decision Making Moderate    Rehab Potential Good    Clinical Impairments Affecting Rehab Potential (+) fairly yong age, motivation (-) Sedentary lifestyle, chronicity of pain, hypersensitivity, psycho-social factors    PT Frequency 2x / week    PT Duration  8 weeks    PT Treatment/Interventions Moist Heat;Traction;Ultrasound;Cryotherapy;Fluidtherapy;Electrical Stimulation;Therapeutic exercise;Taping;Patient/family education;Therapeutic activities;Functional mobility training;Neuromuscular re-education;Passive range of motion;Dry needling;Manual techniques;Visual/perceptual remediation/compensation;Aquatic Therapy;Spinal Manipulations;Joint Manipulations;DME Instruction;Gait training;Balance training;Stair training    PT Next Visit Plan HEP review, TDN?    PT Home Exercise Plan childs pose (wide), plank, bridge    Consulted and Agree with Plan of Care Patient           Patient will benefit from skilled therapeutic intervention in order to improve the following deficits and impairments:  Improper body mechanics, Pain, Postural dysfunction, Impaired tone, Decreased activity tolerance, Decreased endurance, Decreased range of motion, Decreased strength, Impaired UE functional use, Abnormal gait, Increased fascial restricitons, Decreased mobility, Difficulty walking, Impaired flexibility  Visit Diagnosis: Abnormal posture  Sacrococcygeal disorders, not elsewhere classified  Acute bilateral low back pain without sciatica     Problem List Patient Active Problem List   Diagnosis Date Noted   Chronic SI joint pain 10/24/2019   Chronic bilateral low back pain without sciatica 10/24/2019   Insomnia 07/18/2019   B12 deficiency 07/18/2019   History of benign breast biopsy 07/17/2019   Reflux esophagitis 08/02/2018   Right shoulder pain 07/13/2018   Frozen shoulder syndrome  06/13/2018   Encounter for preventive health examination 05/27/2018   Raynaud phenomenon 05/28/2017   Generalized anxiety disorder 05/29/2016   S/P hysterectomy 05/29/2016   Headache 04/14/2016   History of mammogram 03/02/2011   Migraine syndrome    Hx of colonic polyps    GERD (gastroesophageal reflux disease) 12/16/2010   Durwin Reges DPT Durwin Reges 11/16/2019, 10:52 AM  [place] [facility] [address] [place], [address] Phone: [phone]   Fax:  [phone]  Name: Stephanie Henderson MRN: [id number] Date of Birth: 1965-07-15

## 2019-11-27 ENCOUNTER — Ambulatory Visit: Payer: No Typology Code available for payment source | Admitting: Physical Therapy

## 2019-11-27 ENCOUNTER — Encounter: Payer: Self-pay | Admitting: Physical Therapy

## 2019-11-27 ENCOUNTER — Other Ambulatory Visit: Payer: Self-pay

## 2019-11-27 DIAGNOSIS — M533 Sacrococcygeal disorders, not elsewhere classified: Secondary | ICD-10-CM

## 2019-11-27 DIAGNOSIS — R293 Abnormal posture: Secondary | ICD-10-CM

## 2019-11-27 DIAGNOSIS — M545 Low back pain, unspecified: Secondary | ICD-10-CM

## 2019-11-27 DIAGNOSIS — M25511 Pain in right shoulder: Secondary | ICD-10-CM

## 2019-11-27 DIAGNOSIS — G8929 Other chronic pain: Secondary | ICD-10-CM

## 2019-11-27 NOTE — Therapy (Signed)
[place] [facility] [address], [place], [address] Phone: [phone]   Fax:  [phone]  Physical Therapy Treatment  Patient Details  Name: Stephanie Henderson MRN: [id number] Date of Birth: Dec 08, 1965 No data recorded  Encounter Date: 11/27/2019   PT End of Session - 11/28/19 1323    Visit Number 7    Number of Visits 17    Date for PT Re-Evaluation 03/16/19    PT Start Time 0545    PT Stop Time 0625    PT Time Calculation (min) 40 min    Activity Tolerance Patient tolerated treatment well    Behavior During Therapy [facility] for tasks assessed/performed           Past Medical History:  Diagnosis Date  . Fibroid   . GERD (gastroesophageal reflux disease) Aug 2012   with esophagitis by EGD  . Hematuria   . Hx of colonic polyps August 2012   repeat due 2016,  5 polyps 2009, clear 2012 [facility])  . Menorrhagia    did not tolerate trial of ocps due to migraines  . Migraine headache   . Migraine syndrome    since age 57,  sporadic    Past Surgical History:  Procedure Laterality Date  . BREAST BIOPSY Right 2017   benign  . COLONOSCOPY WITH PROPOFOL N/A 07/31/2018   Procedure: COLONOSCOPY WITH PROPOFOL;  Surgeon: Lollie Sails, MD;  Location: [facility] ENDOSCOPY;  Service: Endoscopy;  Laterality: N/A;  . CYSTOSCOPY    . ESOPHAGOGASTRODUODENOSCOPY (EGD) WITH PROPOFOL N/A 07/31/2018   Procedure: ESOPHAGOGASTRODUODENOSCOPY (EGD) WITH PROPOFOL;  Surgeon: Lollie Sails, MD;  Location: [facility] ENDOSCOPY;  Service: Endoscopy;  Laterality: N/A;  . laparoscopy    . VAGINAL HYSTERECTOMY  6/13   with morcellation    There were no vitals filed for this visit.   Subjective Assessment - 11/27/19 1748    Subjective Patient reports that she has felt really good, and [place] had any pain". Reports "maybe 1/10" today with turning to the L. Feeling good overall.    Pertinent  History Pt is a 55 year old female reporting with bilat SIJ/LBP, that she reports started a month ago, and would be on and off over several weeks. 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 Over the past month she has pain with sitting for any period of time in any chair. Reports pain is right across posterior pelvis in soft chairs, and is where her glute meets her lower leg. Pain is achy in nature, but can be sharp on L side if she turns to the left in her car. Worst pain over the past 2 weeks 8/10 best 2/10. 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 Standing and walking improves her pain, though she can feel it. Pt denies N/V, B&B changes, unexplained weight fluctuation, saddle paresthesia, fever, night sweats, or unrelenting night pain at this time.    How long can you sit comfortably? unable    How long can you stand comfortably? unlimited, can be a little achy  How long can you walk comfortably? unlimited, can be a little achy    Diagnostic tests Xrays slight lumbar levoscolosis    Patient Stated Goals Be able to sit without pain    Pain Onset More than a month ago               Manual STM withtrigger point releaseto bilat superior glute max/med fibers;increased time spent on L side Sidelying G1-2 mobilization foranteriorinnominaterotation of the pelvis 4x 30sec bouts;same for posterior rotation   Ther-Ex Deadbug theraball squeeze x10 3sec hold with good carry over of technique following cuing; Deadbug with theraball x10; without x10  Seated on theraball oblique twists 5# 3x 10 with TC for stabilization with good carry over Palloff anti-rotation 5# 2x 10 with  min cuing for technique initially with good carry over Straight leg deadlift 5# DB bilat 3x 10 with good carry over of proper technique following cuing and demo Piriformis stretch x30sec bilat                         PT Short Term Goals - 10/25/19 1719      PT SHORT TERM GOAL #1   Title Pt will be independent with HEP in order to improve strength and decrease pain in order to improve function at home and work    Baseline 10/25/19 HEP given    Time 4    Period Weeks             PT Long Term Goals - 10/25/19 1722      PT LONG TERM GOAL #1   Title Patient will increase FOTO score to 73 to demonstrate predicted increase in functional mobility to complete ADLs    Baseline 10/25/19 57    Time 8    Period Weeks    Status New      PT LONG TERM GOAL #2   Title Pt will decrease worst back pain as reported on NPRS by at least 2 points in order to demonstrate clinically significant reduction in back pain.    Baseline 10/25/19 8/10 bilat SIJ pain    Time 8    Period Weeks    Status New      PT LONG TERM GOAL #3   Title Patient will be able to sit for 49min with pain not exceeding 2/10 in order to be able to complete documentation    Baseline 10/25/19 8/10 pain with any sitting    Time 8    Period Weeks    Status New                 Plan - 11/28/19 1324    Clinical Impression Statement Patient continues to respond well to manual techniques with increased mobility following. PT continued therex progression for increased core and hip stability with pt demonstrating good carry over of all cuing and postural cuing. PT will continue progression as able.    Personal Factors and Comorbidities Past/Current Experience;Fitness;Time since onset of injury/illness/exacerbation    Examination-Activity Limitations Sleep;Sit;Locomotion Level    Examination-Participation Restrictions Cleaning;Community Activity;Driving;Volunteer    Stability/Clinical Decision Making  Evolving/Moderate complexity    Clinical Decision Making Moderate    Rehab Potential Good    Clinical Impairments Affecting Rehab Potential (+) fairly yong age, motivation (-) Sedentary lifestyle, chronicity of pain, hypersensitivity, psycho-social factors    PT Frequency 2x / week    PT Duration 8 weeks    PT Treatment/Interventions Moist Heat;Traction;Ultrasound;Cryotherapy;Fluidtherapy;Electrical Stimulation;Therapeutic exercise;Taping;Patient/family  education;Therapeutic activities;Functional mobility training;Neuromuscular re-education;Passive range of motion;Dry needling;Manual techniques;Visual/perceptual remediation/compensation;Aquatic Therapy;Spinal Manipulations;Joint Manipulations;DME Instruction;Gait training;Balance training;Stair training    PT Next Visit Plan HEP review, TDN?    PT Home Exercise Plan childs pose (wide), plank, bridge    Consulted and Agree with Plan of Care Patient           Patient will benefit from skilled therapeutic intervention in order to improve the following deficits and impairments:  Improper body mechanics, Pain, Postural dysfunction, Impaired tone, Decreased activity tolerance, Decreased endurance, Decreased range of motion, Decreased strength, Impaired UE functional use, Abnormal gait, Increased fascial restricitons, Decreased mobility, Difficulty walking, Impaired flexibility  Visit Diagnosis: Abnormal posture  Sacrococcygeal disorders, not elsewhere classified  Acute bilateral low back pain without sciatica  Chronic right shoulder pain  Acute pain of right shoulder     Problem List Patient Active Problem List   Diagnosis Date Noted  . Chronic SI joint pain 10/24/2019  . Chronic bilateral low back pain without sciatica 10/24/2019  . Insomnia 07/18/2019  . B12 deficiency 07/18/2019  . History of benign breast biopsy 07/17/2019  . Reflux esophagitis 08/02/2018  . Right shoulder pain 07/13/2018  . Frozen shoulder syndrome 06/13/2018   . Encounter for preventive health examination 05/27/2018  . Raynaud phenomenon 05/28/2017  . Generalized anxiety disorder 05/29/2016  . S/P hysterectomy 05/29/2016  . Headache 04/14/2016  . History of mammogram 03/02/2011  . Migraine syndrome   . Hx of colonic polyps   . GERD (gastroesophageal reflux disease) 12/16/2010   Durwin Reges DPT Durwin Reges 11/28/2019, 1:31 PM  [place] [facility] [address] [place], [address] Phone: [phone]   Fax:  [phone]  Name: Stephanie Henderson MRN: [id number] Date of Birth: [birth year]

## 2019-12-04 ENCOUNTER — Other Ambulatory Visit: Payer: Self-pay

## 2019-12-04 ENCOUNTER — Ambulatory Visit: Payer: No Typology Code available for payment source | Admitting: Physical Therapy

## 2019-12-04 ENCOUNTER — Encounter: Payer: Self-pay | Admitting: Physical Therapy

## 2019-12-04 DIAGNOSIS — G8929 Other chronic pain: Secondary | ICD-10-CM

## 2019-12-04 DIAGNOSIS — M545 Low back pain, unspecified: Secondary | ICD-10-CM

## 2019-12-04 DIAGNOSIS — M533 Sacrococcygeal disorders, not elsewhere classified: Secondary | ICD-10-CM

## 2019-12-04 DIAGNOSIS — R293 Abnormal posture: Secondary | ICD-10-CM

## 2019-12-04 DIAGNOSIS — M25511 Pain in right shoulder: Secondary | ICD-10-CM

## 2019-12-04 NOTE — Therapy (Signed)
[facility] [address] [place], [address] Phone: [phone]   Fax:  [phone]  Physical Therapy Treatment  Patient Details  Name: Stephanie Henderson MRN: [id number] Date of Birth: [birth year] No data recorded  Encounter Date: 12/04/2019    Past Medical History:  Diagnosis Date  . Fibroid   . GERD (gastroesophageal reflux disease) Aug 2012   with esophagitis by EGD  . Hematuria   . Hx of colonic polyps August 2012   repeat due 2016,  5 polyps 2009, clear 2012 Gustavo Lah)  . Menorrhagia    did not tolerate trial of ocps due to migraines  . Migraine headache   . Migraine syndrome    since age 24,  sporadic    Past Surgical History:  Procedure Laterality Date  . BREAST BIOPSY Right 2017   benign  . COLONOSCOPY WITH PROPOFOL N/A 07/31/2018   Procedure: COLONOSCOPY WITH PROPOFOL;  Surgeon: Lollie Sails, MD;  Location: [facility] ENDOSCOPY;  Service: Endoscopy;  Laterality: N/A;  . CYSTOSCOPY    . ESOPHAGOGASTRODUODENOSCOPY (EGD) WITH PROPOFOL N/A 07/31/2018   Procedure: ESOPHAGOGASTRODUODENOSCOPY (EGD) WITH PROPOFOL;  Surgeon: Lollie Sails, MD;  Location: [facility] ENDOSCOPY;  Service: Endoscopy;  Laterality: N/A;  . laparoscopy    . VAGINAL HYSTERECTOMY  6/13   with morcellation    There were no vitals filed for this visit.   Subjective Assessment - 12/04/19 1602    Subjective Reports minimal pain overall. 1/10 pain today. COpliance with HEP.    Pertinent History Pt is a 54 year old female reporting with bilat SIJ/LBP, that she reports started a month ago, and would be on and off over several weeks. 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 Over the past month she has pain with sitting for any period of time in any chair. Reports pain is right across  posterior pelvis in soft chairs, and is where her glute meets her lower leg. Pain is achy in nature, but can be sharp on L side if she turns to the left in her car. Worst pain over the past 2 weeks 8/10 best 2/10. 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 Standing and walking improves her pain, though she can feel it. Pt denies N/V, B&B changes, unexplained weight fluctuation, saddle paresthesia, fever, night sweats, or unrelenting night pain at this time.    Limitations Sitting    How long can you sit comfortably? unable    How long can you stand comfortably? unlimited, can be a little achy    How long can you walk comfortably? unlimited, can be a little achy    Diagnostic tests Xrays slight lumbar levoscolosis    Patient Stated Goals Be able to sit without pain    Pain Onset More than a month ago              Manual STM withtrigger point releaseto bilat superior glute max/med fibers;increased time spent on L side Sidelying G1-2 mobilization foranteriorinnominaterotation of the pelvis 4x 30sec bouts;same for posterior rotation   Ther-Ex Deadbug with theraball x10; without 2 x10 with min cuing  for set up with good carry over following Alt reverse lunge with 5# in BUE flex for core activation 3x 10 with good carry over of proper technique following min cuing and demo  Supine LE windshield wipers 3x 7/8/8 with cuing needed initially with good carry over Lateral childs pose 30sec bilat (HEP update) Piriformis stretch 30sec bilat          PT Education - 12/04/19 1635    Education Details therex form/technique    Person(s) Educated Patient    Methods Explanation;Demonstration;Verbal cues    Comprehension Verbalized understanding;Returned demonstration;Verbal cues required            PT Short  Term Goals - 10/25/19 1719      PT SHORT TERM GOAL #1   Title Pt will be independent with HEP in order to improve strength and decrease pain in order to improve function at home and work    Baseline 10/25/19 HEP given    Time 4    Period Weeks             PT Long Term Goals - 10/25/19 1722      PT LONG TERM GOAL #1   Title Patient will increase FOTO score to 73 to demonstrate predicted increase in functional mobility to complete ADLs    Baseline 10/25/19 57    Time 8    Period Weeks    Status New      PT LONG TERM GOAL #2   Title Pt will decrease worst back pain as reported on NPRS by at least 2 points in order to demonstrate clinically significant reduction in back pain.    Baseline 10/25/19 8/10 bilat SIJ pain    Time 8    Period Weeks    Status New      PT LONG TERM GOAL #3   Title Patient will be able to sit for 70min with pain not exceeding 2/10 in order to be able to complete documentation    Baseline 10/25/19 8/10 pain with any sitting    Time 8    Period Weeks    Status New                 Plan - 12/04/19 1640    Clinical Impression Statement PT continued to utilize manual techniques to increase mobility with good success. PT continued therax progression for increased core stability with good carry over of cuing for proper technique/muscle activation. PT educated patient on continuing stretching regimen with updated lateral childs pose to address pain sitting onto hard surface. PT will continue progression as able.    Personal Factors and Comorbidities Past/Current Experience;Fitness;Time since onset of injury/illness/exacerbation    Examination-Activity Limitations Sleep;Sit;Locomotion Level    Examination-Participation Restrictions Cleaning;Community Activity;Driving;Volunteer    Stability/Clinical Decision Making Evolving/Moderate complexity    Clinical Decision Making Moderate    Rehab Potential Good    Clinical Impairments Affecting Rehab Potential (+)  fairly yong age, motivation (-) Sedentary lifestyle, chronicity of pain, hypersensitivity, psycho-social factors    PT Frequency 2x / week    PT Duration 8 weeks    PT Treatment/Interventions Moist Heat;Traction;Ultrasound;Cryotherapy;Fluidtherapy;Electrical Stimulation;Therapeutic exercise;Taping;Patient/family education;Therapeutic activities;Functional mobility training;Neuromuscular re-education;Passive range of motion;Dry needling;Manual techniques;Visual/perceptual remediation/compensation;Aquatic Therapy;Spinal Manipulations;Joint Manipulations;DME Instruction;Gait training;Balance training;Stair training    PT Next Visit Plan HEP review, TDN?    PT Home Exercise Plan childs pose (wide), plank, bridge    Consulted and Agree with Plan of Care Patient  Patient will benefit from skilled therapeutic intervention in order to improve the following deficits and impairments:  Improper body mechanics, Pain, Postural dysfunction, Impaired tone, Decreased activity tolerance, Decreased endurance, Decreased range of motion, Decreased strength, Impaired UE functional use, Abnormal gait, Increased fascial restricitons, Decreased mobility, Difficulty walking, Impaired flexibility  Visit Diagnosis: Abnormal posture  Sacrococcygeal disorders, not elsewhere classified  Acute bilateral low back pain without sciatica  Chronic right shoulder pain  Acute pain of right shoulder     Problem List Patient Active Problem List   Diagnosis Date Noted  . Chronic SI joint pain 10/24/2019  . Chronic bilateral low back pain without sciatica 10/24/2019  . Insomnia 07/18/2019  . B12 deficiency 07/18/2019  . History of benign breast biopsy 07/17/2019  . Reflux esophagitis 08/02/2018  . Right shoulder pain 07/13/2018  . Frozen shoulder syndrome 06/13/2018  . Encounter for preventive health examination 05/27/2018  . Raynaud phenomenon 05/28/2017  . Generalized anxiety disorder 05/29/2016  . S/P  hysterectomy 05/29/2016  . Headache 04/14/2016  . History of mammogram 03/02/2011  . Migraine syndrome   . Hx of colonic polyps   . GERD (gastroesophageal reflux disease) 12/16/2010   Durwin Reges DPT Durwin Reges 12/04/2019, 4:45 PM  [facility] [address], [place], [address] Phone: [phone]   Fax:  [phone]  Name: Stephanie Henderson MRN: [id number] Date of Birth: 1965/11/25

## 2019-12-11 ENCOUNTER — Encounter: Payer: Self-pay | Admitting: Physical Therapy

## 2019-12-11 ENCOUNTER — Other Ambulatory Visit: Payer: Self-pay

## 2019-12-11 ENCOUNTER — Ambulatory Visit: Payer: No Typology Code available for payment source | Admitting: Physical Therapy

## 2019-12-11 DIAGNOSIS — R293 Abnormal posture: Secondary | ICD-10-CM

## 2019-12-11 DIAGNOSIS — M545 Low back pain, unspecified: Secondary | ICD-10-CM

## 2019-12-11 DIAGNOSIS — M533 Sacrococcygeal disorders, not elsewhere classified: Secondary | ICD-10-CM

## 2019-12-11 NOTE — Therapy (Signed)
[facility] [address] [place], [address] Phone: [phone]   Fax:  [phone]  Physical Therapy Treatment/Discharge Summary Reporting Period: 10/25/19 - 12/11/19  Patient Details  Name: Roslin Norwood MRN: [id number] Date of Birth: 08/08/1965 No data recorded  Encounter Date: 12/11/2019   PT End of Session - 12/11/19 1705    Visit Number 9    Number of Visits 17    Date for PT Re-Evaluation 03/16/19    PT Start Time 0400    PT Stop Time 0430    PT Time Calculation (min) 30 min    Activity Tolerance Patient tolerated treatment well    Behavior During Therapy [facility] for tasks assessed/performed           Past Medical History:  Diagnosis Date  . Fibroid   . GERD (gastroesophageal reflux disease) Aug 2012   with esophagitis by EGD  . Hematuria   . Hx of colonic polyps August 2012   repeat due 2016,  5 polyps 2009, clear 2012 Gustavo Lah)  . Menorrhagia    did not tolerate trial of ocps due to migraines  . Migraine headache   . Migraine syndrome    since age 62,  sporadic    Past Surgical History:  Procedure Laterality Date  . BREAST BIOPSY Right 2017   benign  . COLONOSCOPY WITH PROPOFOL N/A 07/31/2018   Procedure: COLONOSCOPY WITH PROPOFOL;  Surgeon: Lollie Sails, MD;  Location: [facility] ENDOSCOPY;  Service: Endoscopy;  Laterality: N/A;  . CYSTOSCOPY    . ESOPHAGOGASTRODUODENOSCOPY (EGD) WITH PROPOFOL N/A 07/31/2018   Procedure: ESOPHAGOGASTRODUODENOSCOPY (EGD) WITH PROPOFOL;  Surgeon: Lollie Sails, MD;  Location: [facility] ENDOSCOPY;  Service: Endoscopy;  Laterality: N/A;  . laparoscopy    . VAGINAL HYSTERECTOMY  6/13   with morcellation    There were no vitals filed for this visit.   Subjective Assessment - 12/11/19 1605    Subjective Still feels very good overall, reporting no pain over the past 1 week.    Pertinent History Pt is a 54 year old female reporting with bilat SIJ/LBP,  that she reports started a month ago, and would be on and off over several weeks. 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 Over the past month she has pain with sitting for any period of time in any chair. Reports pain is right across posterior pelvis in soft chairs, and is where her glute meets her lower leg. Pain is achy in nature, but can be sharp on L side if she turns to the left in her car. Worst pain over the past 2 weeks 8/10 best 2/10. 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 Standing and walking improves her pain, though she can feel it. Pt denies N/V, B&B changes, unexplained weight fluctuation, saddle paresthesia, fever, night sweats, or unrelenting night pain at this time.    Limitations Sitting    How long can you sit comfortably? unable    How long can you stand comfortably? unlimited, can be a little achy    How long can you walk comfortably? unlimited, can be  a little achy    Diagnostic tests Xrays slight lumbar levoscolosis    Patient Stated Goals Be able to sit without pain    Pain Onset More than a month ago              Ther-Ex PT reviewed the following HEP with patient with patient able to demonstrate a set of the following with min cuing for correction needed. PT educated patient on parameters of therex (how/when to inc/decrease intensity, frequency, rep/set range, stretch hold time, and purpose of therex) with verbalized understanding.  Access Code: [email or web address] Reverse Lunge - 1 x daily - 1-2 x weekly - 3 sets - 10 reps Goblet Squat with Kettlebell - 1 x daily - 1-2 x weekly - 3 sets - 10 reps Supine 90/90 Lower Trunk Rotation - 1 x daily - 1-2 x weekly  - 3 sets - 10 reps Standard Plank - 1 x daily - 1-2 x weekly - 3 sets - 30sec hold Plank with Hip Extension - 1 x daily - 1-2 x weekly - 3 sets - 10 reps                           PT Short Term Goals - 12/11/19 1606      PT SHORT TERM GOAL #1   Title Pt will be independent with HEP in order to improve strength and decrease pain in order to improve function at home and work    Baseline 10/25/19 HEP given 12/11/19 completing without issue    Time 4    Period Weeks    Status Achieved             PT Long Term Goals - 12/11/19 1607      PT LONG TERM GOAL #1   Title Patient will increase FOTO score to 73 to demonstrate predicted increase in functional mobility to complete ADLs    Baseline 10/25/19 57 12/11/19 76    Time 8    Period Weeks    Status Achieved      PT LONG TERM GOAL #2   Title Pt will decrease worst back pain as reported on NPRS by at least 2 points in order to demonstrate clinically significant reduction in back pain.    Baseline 10/25/19 8/10 bilat SIJ pain 12/11/19 2/10 pain that subsides quickly    Time 8    Period Weeks    Status Achieved      PT LONG TERM GOAL #3   Title Patient will be able to sit for 47mn with pain not exceeding 2/10 in order to be able to complete documentation    Baseline 10/25/19 8/10 pain with any sitting; 12/11/19 able to sit for any amount of pain without pain exceeding 2/10    Time 8    Period Weeks    Status Achieved                 Plan - 12/11/19 1702    Clinical Impression Statement PT reviewed goals with patient where patient has met all goals to safely d/c PT at this time. Patient is able to demonstrate and verbalize understanding of HEP for maintenance of core strength, and neutral posture with min cuing for correction needed. Patient given clinic contact info should any further questions or concerns arise.    Personal Factors and Comorbidities Past/Current Experience;Fitness;Time since onset of  injury/illness/exacerbation    Examination-Activity Limitations Sleep;Sit;Locomotion Level    Examination-Participation Restrictions  Cleaning;Community Activity;Driving;Volunteer    Stability/Clinical Decision Making Evolving/Moderate complexity    Clinical Decision Making Moderate    Rehab Potential Good    Clinical Impairments Affecting Rehab Potential (+) fairly yong age, motivation (-) Sedentary lifestyle, chronicity of pain, hypersensitivity, psycho-social factors    PT Frequency 2x / week    PT Duration 8 weeks    PT Treatment/Interventions Moist Heat;Traction;Ultrasound;Cryotherapy;Fluidtherapy;Electrical Stimulation;Therapeutic exercise;Taping;Patient/family education;Therapeutic activities;Functional mobility training;Neuromuscular re-education;Passive range of motion;Dry needling;Manual techniques;Visual/perceptual remediation/compensation;Aquatic Therapy;Spinal Manipulations;Joint Manipulations;DME Instruction;Gait training;Balance training;Stair training    PT Home Exercise Plan see d/c summary    Consulted and Agree with Plan of Care Patient           Patient will benefit from skilled therapeutic intervention in order to improve the following deficits and impairments:  Improper body mechanics, Pain, Postural dysfunction, Impaired tone, Decreased activity tolerance, Decreased endurance, Decreased range of motion, Decreased strength, Impaired UE functional use, Abnormal gait, Increased fascial restricitons, Decreased mobility, Difficulty walking, Impaired flexibility  Visit Diagnosis: Abnormal posture  Sacrococcygeal disorders, not elsewhere classified  Acute bilateral low back pain without sciatica     Problem List Patient Active Problem List   Diagnosis Date Noted  . Chronic SI joint pain 10/24/2019  . Chronic bilateral low back pain without sciatica 10/24/2019  . Insomnia 07/18/2019  . B12 deficiency 07/18/2019  . History of benign breast biopsy 07/17/2019  . Reflux  esophagitis 08/02/2018  . Right shoulder pain 07/13/2018  . Frozen shoulder syndrome 06/13/2018  . Encounter for preventive health examination 05/27/2018  . Raynaud phenomenon 05/28/2017  . Generalized anxiety disorder 05/29/2016  . S/P hysterectomy 05/29/2016  . Headache 04/14/2016  . History of mammogram 03/02/2011  . Migraine syndrome   . Hx of colonic polyps   . GERD (gastroesophageal reflux disease) 12/16/2010   Durwin Reges DPT Durwin Reges 12/11/2019, 5:05 PM  [facility] [address] [place], [address] Phone: [phone]   Fax:  [phone]  Name: Disney Ruggiero MRN: [id number] Date of Birth: April 17, 1966

## 2019-12-17 ENCOUNTER — Ambulatory Visit: Payer: No Typology Code available for payment source | Admitting: Physical Therapy

## 2019-12-19 ENCOUNTER — Ambulatory Visit: Payer: No Typology Code available for payment source | Admitting: Physical Therapy

## 2020-01-22 ENCOUNTER — Telehealth: Payer: No Typology Code available for payment source | Admitting: Physician Assistant

## 2020-01-22 DIAGNOSIS — J0191 Acute recurrent sinusitis, unspecified: Secondary | ICD-10-CM | POA: Diagnosis not present

## 2020-01-22 MED ORDER — AMOXICILLIN-POT CLAVULANATE 875-125 MG PO TABS
1.0000 | ORAL_TABLET | Freq: Two times a day (BID) | ORAL | 0 refills | Status: DC
Start: 2020-01-22 — End: 2020-04-22

## 2020-01-22 NOTE — Progress Notes (Signed)
We are sorry that you are not feeling well.  Here is how we plan to help!  Based on what you have shared with me it looks like you have sinusitis.  Sinusitis is inflammation and infection in the sinus cavities of the head.  Based on your presentation I believe you most likely have Acute Bacterial Sinusitis.  This is an infection caused by bacteria and is treated with antibiotics. I have prescribed Augmentin 875mg /125mg  one tablet twice daily with food, for 10 day You may use an oral decongestant such as Mucinex D or if you have glaucoma or high blood pressure use plain Mucinex. Saline nasal spray help and can safely be used as often as needed for congestion.  If you develop worsening sinus pain, fever or notice severe headache and vision changes, or if symptoms are not better after completion of antibiotic, please schedule an appointment with a health care provider.    Sinus infections are not as easily transmitted as other respiratory infection, however we still recommend that you avoid close contact with loved ones, especially the very young and elderly.  Remember to wash your hands thoroughly throughout the day as this is the number one way to prevent the spread of infection!  Home Care:  Only take medications as instructed by your medical team.  Complete the entire course of an antibiotic.  Do not take these medications with alcohol.  A steam or ultrasonic humidifier can help congestion.  You can place a towel over your head and breathe in the steam from hot water coming from a faucet.  Avoid close contacts especially the very young and the elderly.  Cover your mouth when you cough or sneeze.  Always remember to wash your hands.  Get Help Right Away If:  You develop worsening fever or sinus pain.  You develop a severe head ache or visual changes.  Your symptoms persist after you have completed your treatment plan.  Make sure you  Understand these instructions.  Will watch your  condition.  Will get help right away if you are not doing well or get worse.  Your e-visit answers were reviewed by a board certified advanced clinical practitioner to complete your personal care plan.  Depending on the condition, your plan could have included both over the counter or prescription medications.  If there is a problem please reply  once you have received a response from your provider.  Your safety is important to [place].  If you have drug allergies check your prescription carefully.    You can use MyChart to ask questions about today's visit, request a non-urgent call back, or ask for a work or school excuse for 24 hours related to this e-Visit. If it has been greater than 24 hours you will need to follow up with your provider, or enter a new e-Visit to address those concerns.  You will get an e-mail in the next two days asking about your experience.  I hope that your e-visit has been valuable and will speed your recovery. Thank you for using e-visits.   5 min on chart

## 2020-01-28 ENCOUNTER — Telehealth: Payer: Self-pay

## 2020-01-28 NOTE — Telephone Encounter (Signed)
Reached out to pt this morning to see about scheduling her for a virtual visit since she is positive for covid. Pt stated that she is feeling much better and doesn't think she needs anything at this time. Verbally spoke with Dr. Derrel Nip about this.

## 2020-04-14 ENCOUNTER — Telehealth: Payer: No Typology Code available for payment source | Admitting: Physician Assistant

## 2020-04-14 ENCOUNTER — Other Ambulatory Visit: Payer: Self-pay | Admitting: Physician Assistant

## 2020-04-14 DIAGNOSIS — N760 Acute vaginitis: Secondary | ICD-10-CM | POA: Diagnosis not present

## 2020-04-14 MED ORDER — FLUCONAZOLE 150 MG PO TABS
150.0000 mg | ORAL_TABLET | Freq: Once | ORAL | 0 refills | Status: DC
Start: 2020-04-14 — End: 2020-04-14

## 2020-04-14 NOTE — Progress Notes (Signed)

## 2020-04-22 ENCOUNTER — Other Ambulatory Visit: Payer: Self-pay | Admitting: Internal Medicine

## 2020-04-22 ENCOUNTER — Other Ambulatory Visit: Payer: Self-pay

## 2020-04-22 ENCOUNTER — Encounter: Payer: Self-pay | Admitting: Internal Medicine

## 2020-04-22 ENCOUNTER — Other Ambulatory Visit (HOSPITAL_COMMUNITY)
Admission: RE | Admit: 2020-04-22 | Discharge: 2020-04-22 | Disposition: A | Discharge status: Home / Self Care | Payer: No Typology Code available for payment source | Source: Ambulatory Visit | Attending: Internal Medicine | Admitting: Internal Medicine

## 2020-04-22 ENCOUNTER — Ambulatory Visit (INDEPENDENT_AMBULATORY_CARE_PROVIDER_SITE_OTHER): Payer: No Typology Code available for payment source | Admitting: Internal Medicine

## 2020-04-22 VITALS — BP 116/64 | HR 100 | Temp 96.9°F | Ht 62.0 in | Wt 113.8 lb

## 2020-04-22 DIAGNOSIS — H938X1 Other specified disorders of right ear: Secondary | ICD-10-CM | POA: Diagnosis not present

## 2020-04-22 DIAGNOSIS — N760 Acute vaginitis: Secondary | ICD-10-CM

## 2020-04-22 DIAGNOSIS — H6121 Impacted cerumen, right ear: Secondary | ICD-10-CM

## 2020-04-22 MED ORDER — METRONIDAZOLE 500 MG PO TABS: 500.0000 mg | ORAL_TABLET | Freq: Two times a day (BID) | ORAL | 0 refills | Status: DC

## 2020-04-22 MED ORDER — FLUCONAZOLE 150 MG PO TABS: 150.0000 mg | ORAL_TABLET | Freq: Once | ORAL | 0 refills | Status: DC

## 2020-04-22 NOTE — Progress Notes (Signed)
Chief Complaint  Patient presents with  . Vaginitis   1. Vaginal irritation starting the weekend before thanksgiving with burning discharge and discomfort and itching tried monistat 1 otc and burning like fire sxs improved and then came back and then 1 week ago have virtual with diflucan 150 mg qd and helped but 5-6 days sx's returned using tide detergent not new and soap not using in private area. No fever/chills/dysuria  She has thick white draining from vagina  2. Right ear cerumen impaction agreeable to ear flu and had ear infection with pain 01/2020    Review of Systems  Genitourinary: Negative for dysuria.       +vaginal irritation     Past Medical History:  Diagnosis Date  . Fibroid   . GERD (gastroesophageal reflux disease) Aug 2012   with esophagitis by EGD  . Hematuria   . Hx of colonic polyps August 2012   repeat due 2016,  5 polyps 2009, clear 2012 Gustavo Lah)  . Menorrhagia    did not tolerate trial of ocps due to migraines  . Migraine headache   . Migraine syndrome    since age 89,  sporadic   Past Surgical History:  Procedure Laterality Date  . BREAST BIOPSY Right 2017   benign  . COLONOSCOPY WITH PROPOFOL N/A 07/31/2018   Procedure: COLONOSCOPY WITH PROPOFOL;  Surgeon: Lollie Sails, MD;  Location: [facility] ENDOSCOPY;  Service: Endoscopy;  Laterality: N/A;  . CYSTOSCOPY    . ESOPHAGOGASTRODUODENOSCOPY (EGD) WITH PROPOFOL N/A 07/31/2018   Procedure: ESOPHAGOGASTRODUODENOSCOPY (EGD) WITH PROPOFOL;  Surgeon: Lollie Sails, MD;  Location: [facility] ENDOSCOPY;  Service: Endoscopy;  Laterality: N/A;  . laparoscopy    . VAGINAL HYSTERECTOMY  6/13   with morcellation   Family History  Problem Relation Age of Onset  . Hyperlipidemia Mother   . Mental illness Mother        depression  . Colon cancer Father   . Depression Father   . Cancer Father 57       mets to lung   . Mental illness Father   . Kidney disease Daughter   . Diabetes Neg Hx   . Heart disease Neg  Hx   . Breast cancer Neg Hx   . Ovarian cancer Neg Hx    Social History   Socioeconomic History  . Marital status: Married    Spouse name: Not on file  . Number of children: 2  . Years of education: Not on file  . Highest education level: Not on file  Occupational History  . 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 Smoking status: Never Smoker  . Smokeless tobacco: Never Used  Vaping Use  . Vaping Use: Never used  Substance and Sexual Activity  . Alcohol use: Yes    Alcohol/week: 4.0 - 5.0 standard drinks    Types: 4 - 5 Standard drinks or equivalent per week    Comment: OCCAS  . Drug use: No  . Sexual activity: Yes    Birth control/protection: Surgical  Other Topics Concern  . Not on file  Social History Narrative  . Not on file   Social Determinants of Health   Financial Resource Strain:   . Difficulty of Paying Living Expenses: Not on file  Food Insecurity:   . Worried About Charity fundraiser in the Last Year: Not on file  . Ran Out of Food in the Last Year: Not on file  Transportation Needs:   .  Lack of Transportation (Medical): Not on file  . Lack of Transportation (Non-Medical): Not on file  Physical Activity:   . Days of Exercise per Week: Not on file  . Minutes of Exercise per Session: Not on file  Stress:   . Feeling of Stress : Not on file  Social Connections:   . Frequency of Communication with Friends and Family: Not on file  . Frequency of Social Gatherings with Friends and Family: Not on file  . Attends Religious Services: Not on file  . Active Member of Clubs or Organizations: Not on file  . Attends Archivist Meetings: Not on file  . Marital Status: Not on file  Intimate Partner Violence:   . Fear of Current or Ex-Partner: Not on file  . Emotionally Abused: Not on file  . Physically Abused: Not on file  . Sexually Abused: Not on file   No outpatient medications have been marked as taking for the 04/22/20 encounter  (Office Visit) with McLean-Scocuzza, Nino Glow, MD.   Allergies  Allergen Reactions  . Propoxyphene     Other reaction(s): Other (See Comments) Unknown  . Tetracyclines & Related Diarrhea and Nausea Only   No results found for this or any previous visit (from the past 2160 hour(s)). Objective  Body mass index is 20.81 kg/m. Wt Readings from Last 3 Encounters:  04/22/20 113 lb 12.8 oz (51.6 kg)  10/23/19 114 lb (51.7 kg)  07/17/19 112 lb 6.4 oz (51 kg)   Temp Readings from Last 3 Encounters:  04/22/20 (!) 96.9 F (36.1 C) (Skin)  10/23/19 98.5 F (36.9 C) (Temporal)  07/17/19 97.9 F (36.6 C) (Temporal)   BP Readings from Last 3 Encounters:  04/22/20 116/64  10/23/19 116/72  07/17/19 120/68   Pulse Readings from Last 3 Encounters:  04/22/20 100  10/23/19 83  07/17/19 68    Physical Exam Vitals and nursing note reviewed.  Constitutional:      Appearance: Normal appearance. She is well-developed and well-groomed.  HENT:     Head: Normocephalic and atraumatic.  Eyes:     Conjunctiva/sclera: Conjunctivae normal.     Pupils: Pupils are equal, round, and reactive to light.  Cardiovascular:     Rate and Rhythm: Normal rate and regular rhythm.     Heart sounds: Normal heart sounds. No murmur heard.   Pulmonary:     Effort: Pulmonary effort is normal.     Breath sounds: Normal breath sounds.  Skin:    General: Skin is warm and dry.  Neurological:     General: No focal deficit present.     Mental Status: She is alert and oriented to person, place, and time. Mental status is at baseline.     Gait: Gait normal.  Psychiatric:        Attention and Perception: Attention and perception normal.        Mood and Affect: Mood and affect normal.        Speech: Speech normal.        Behavior: Behavior normal. Behavior is cooperative.        Thought Content: Thought content normal.        Cognition and Memory: Cognition and memory normal.        Judgment: Judgment normal.      Assessment  Plan  Acute vaginitis - Plan: Urine cytology ancillary only([place]), metroNIDAZOLE (FLAGYL) 500 MG tablet bid x 1 week, fluconazole (DIFLUCAN) 150 MG tablet  Right ear impacted cerumen with  h/o pain 01/2020 due to infection  Consented to ear lavage with currette right ear only  Tolerated all wax removed  Prn debrox  Provider: Dr. Olivia Mackie McLean-Scocuzza-Internal Medicine

## 2020-04-22 NOTE — Patient Instructions (Signed)
Debrox ear wax drops  5-10 drops x 1 week 1x per month   Vaginitis Vaginitis is a condition in which the vaginal tissue swells and becomes red (inflamed). This condition is most often caused by a change in the normal balance of bacteria and yeast that live in the vagina. This change causes an overgrowth of certain bacteria or yeast, which causes the inflammation. There are different types of vaginitis, but the most common types are:  Bacterial vaginosis.  Yeast infection (candidiasis).  Trichomoniasis vaginitis. This is a sexually transmitted disease (STD).  Viral vaginitis.  Atrophic vaginitis.  Allergic vaginitis. What are the causes? The cause of this condition depends on the type of vaginitis. It can be caused by:  Bacteria (bacterial vaginosis).  Yeast, which is a fungus (yeast infection).  A parasite (trichomoniasis vaginitis).  A virus (viral vaginitis).  Low hormone levels (atrophic vaginitis). Low hormone levels can occur during pregnancy, breastfeeding, or after menopause.  Irritants, such as bubble baths, scented tampons, and feminine sprays (allergic vaginitis). Other factors can change the normal balance of the yeast and bacteria that live in the vagina. These include:  Antibiotic medicines.  Poor hygiene.  Diaphragms, vaginal sponges, spermicides, birth control pills, and intrauterine devices (IUD).  Sex.  Infection.  Uncontrolled diabetes.  A weakened defense (immune) system. What increases the risk? This condition is more likely to develop in women who:  Smoke.  Use vaginal douches, scented tampons, or scented sanitary pads.  Wear tight-fitting pants.  Wear thong underwear.  Use oral birth control pills or an IUD.  Have sex without a condom.  Have multiple sex partners.  Have an STD.  Frequently use the spermicide nonoxynol-9.  Eat lots of foods high in sugar.  Have uncontrolled diabetes.  Have low estrogen levels.  Have a  weakened immune system from an immune disorder or medical treatment.  Are pregnant or breastfeeding. What are the signs or symptoms? Symptoms vary depending on the cause of the vaginitis. Common symptoms include:  Abnormal vaginal discharge. ? The discharge is white, gray, or yellow with bacterial vaginosis. ? The discharge is thick, white, and cheesy with a yeast infection. ? The discharge is frothy and yellow or greenish with trichomoniasis.  A bad vaginal smell. The smell is fishy with bacterial vaginosis.  Vaginal itching, pain, or swelling.  Sex that is painful.  Pain or burning when urinating. Sometimes there are no symptoms. How is this diagnosed? This condition is diagnosed based on your symptoms and medical history. A physical exam, including a pelvic exam, will also be done. You may also have other tests, including:  Tests to determine the pH level (acidity or alkalinity) of your vagina.  A whiff test, to assess the odor that results when a sample of your vaginal discharge is mixed with a potassium hydroxide solution.  Tests of vaginal fluid. A sample will be examined under a microscope. How is this treated? Treatment varies depending on the type of vaginitis you have. Your treatment may include:  Antibiotic creams or pills to treat bacterial vaginosis and trichomoniasis.  Antifungal medicines, such as vaginal creams or suppositories, to treat a yeast infection.  Medicine to ease discomfort if you have viral vaginitis. Your sexual partner should also be treated.  Estrogen delivered in a cream, pill, suppository, or vaginal ring to treat atrophic vaginitis. If vaginal dryness occurs, lubricants and moisturizing creams may help. You may need to avoid scented soaps, sprays, or douches.  Stopping use of a product  that is causing allergic vaginitis. Then using a vaginal cream to treat the symptoms. Follow these instructions at home: Lifestyle  Keep your genital area  clean and dry. Avoid soap, and only rinse the area with water.  Do not douche or use tampons until your health care provider says it is okay to do so. Use sanitary pads, if needed.  Do not have sex until your health care provider approves. When you can return to sex, practice safe sex and use condoms.  Wipe from front to back. This avoids the spread of bacteria from the rectum to the vagina. General instructions  Take over-the-counter and prescription medicines only as told by your health care provider.  If you were prescribed an antibiotic medicine, take or use it as told by your health care provider. Do not stop taking or using the antibiotic even if you start to feel better.  Keep all follow-up visits as told by your health care provider. This is important. How is this prevented?  Use mild, non-scented products. Do not use things that can irritate the vagina, such as fabric softeners. Avoid the following products if they are scented: ? Feminine sprays. ? Detergents. ? Tampons. ? Feminine hygiene products. ? Soaps or bubble baths.  Let air reach your genital area. ? Wear cotton underwear to reduce moisture buildup. ? Avoid wearing underwear while you sleep. ? Avoid wearing tight pants and underwear or nylons without a cotton panel. ? Avoid wearing thong underwear.  Take off any wet clothing, such as bathing suits, as soon as possible.  Practice safe sex and use condoms. Contact a health care provider if:  You have abdominal pain.  You have a fever.  You have symptoms that last for more than 2-3 days. Get help right away if:  You have a fever and your symptoms suddenly get worse. Summary  Vaginitis is a condition in which the vaginal tissue becomes inflamed.This condition is most often caused by a change in the normal balance of bacteria and yeast that live in the vagina.  Treatment varies depending on the type of vaginitis you have.  Do not douche, use tampons , or  have sex until your health care provider approves. When you can return to sex, practice safe sex and use condoms. This information is not intended to replace advice given to you by your health care provider. Make sure you discuss any questions you have with your health care provider. Document Revised: 04/15/2017 Document Reviewed: 06/08/2016 Elsevier Patient Education  2020 [employer].

## 2020-04-23 MED ORDER — NEOMYCIN-POLYMYXIN-HC 3.5-10000-1 OT SOLN: 4.0000 [drp] | Freq: Four times a day (QID) | OTIC | 0 refills | Status: DC

## 2020-04-23 NOTE — Addendum Note (Signed)
Addended by: Orland Mustard on: 04/23/2020 10:53 AM   Modules accepted: Orders

## 2020-04-25 LAB — URINE CYTOLOGY ANCILLARY ONLY
Bacterial Vaginitis-Urine: NEGATIVE
Candida Urine: NEGATIVE

## 2020-04-28 ENCOUNTER — Other Ambulatory Visit: Payer: Self-pay | Admitting: Internal Medicine

## 2020-04-28 DIAGNOSIS — N3000 Acute cystitis without hematuria: Secondary | ICD-10-CM

## 2020-06-10 ENCOUNTER — Encounter: Payer: Self-pay | Admitting: *Deleted

## 2020-06-15 ENCOUNTER — Encounter: Payer: Self-pay | Admitting: Emergency Medicine

## 2020-06-15 ENCOUNTER — Other Ambulatory Visit: Payer: Self-pay

## 2020-06-15 ENCOUNTER — Emergency Department: Payer: 59

## 2020-06-15 DIAGNOSIS — R55 Syncope and collapse: Secondary | ICD-10-CM | POA: Insufficient documentation

## 2020-06-15 DIAGNOSIS — W19XXXA Unspecified fall, initial encounter: Secondary | ICD-10-CM | POA: Diagnosis not present

## 2020-06-15 DIAGNOSIS — D1809 Hemangioma of other sites: Secondary | ICD-10-CM | POA: Diagnosis not present

## 2020-06-15 DIAGNOSIS — R110 Nausea: Secondary | ICD-10-CM | POA: Diagnosis not present

## 2020-06-15 DIAGNOSIS — S0990XA Unspecified injury of head, initial encounter: Secondary | ICD-10-CM | POA: Diagnosis not present

## 2020-06-15 DIAGNOSIS — I959 Hypotension, unspecified: Secondary | ICD-10-CM | POA: Diagnosis not present

## 2020-06-15 DIAGNOSIS — S199XXA Unspecified injury of neck, initial encounter: Secondary | ICD-10-CM | POA: Diagnosis not present

## 2020-06-15 DIAGNOSIS — R0902 Hypoxemia: Secondary | ICD-10-CM | POA: Diagnosis not present

## 2020-06-15 LAB — CBC
HCT: 36.3 % (ref 36.0–46.0)
Hemoglobin: 12.2 g/dL (ref 12.0–15.0)
MCH: 32.7 pg (ref 26.0–34.0)
MCHC: 33.6 g/dL (ref 30.0–36.0)
MCV: 97.3 fL (ref 80.0–100.0)
Platelets: 193 10*3/uL (ref 150–400)
RBC: 3.73 MIL/uL — ABNORMAL LOW (ref 3.87–5.11)
RDW: 12.8 % (ref 11.5–15.5)
WBC: 10.3 10*3/uL (ref 4.0–10.5)
nRBC: 0 % (ref 0.0–0.2)

## 2020-06-15 LAB — BASIC METABOLIC PANEL
Anion gap: 11 (ref 5–15)
BUN: 13 mg/dL (ref 6–20)
CO2: 27 mmol/L (ref 22–32)
Calcium: 9.3 mg/dL (ref 8.9–10.3)
Chloride: 102 mmol/L (ref 98–111)
Creatinine, Ser: 0.58 mg/dL (ref 0.44–1.00)
GFR, Estimated: >60 mL/min (ref >60)
Glucose, Bld: 145 mg/dL — ABNORMAL HIGH (ref 70–99)
Potassium: 3.6 mmol/L (ref 3.5–5.1)
Sodium: 140 mmol/L (ref 135–145)

## 2020-06-15 LAB — TROPONIN I (HIGH SENSITIVITY): Troponin I (High Sensitivity): <2 ng/L (ref <18)

## 2020-06-15 LAB — LIPASE, BLOOD: Lipase: 46 U/L (ref 11–51)

## 2020-06-15 NOTE — ED Triage Notes (Addendum)
Pt arrived via EMS from home where she had a witnessed syncopal episode where she hit head into drywall. Upon arrival by fire, pt was hypotensive at 70/30, stood up and had second syncopal episode, caught by fire department. Hematoma to the nose. Pt c/o dizziness and lower abdominal pain. Pt has received approx 368ml NS in route.

## 2020-06-16 ENCOUNTER — Emergency Department
Admission: EM | Admit: 2020-06-16 | Discharge: 2020-06-16 | Disposition: A | Discharge status: Home / Self Care | Payer: 59 | Attending: Emergency Medicine | Admitting: Emergency Medicine

## 2020-06-16 DIAGNOSIS — R55 Syncope and collapse: Secondary | ICD-10-CM

## 2020-06-16 LAB — TROPONIN I (HIGH SENSITIVITY): Troponin I (High Sensitivity): <2 ng/L (ref <18)

## 2020-06-16 NOTE — ED Notes (Signed)
Pt verbalized understanding of d/c instructions at this time and denied further questions. Pt ambulatory to lobby with husband at this time. NAD noted, steady gait noted, RR even and unlabored.

## 2020-06-16 NOTE — ED Provider Notes (Signed)
[facility] Emergency Department Provider Note   ____________________________________________   Event Date/Time   First MD Initiated Contact with Patient 06/16/20 0935     (approximate)  I have reviewed the triage vital signs and the nursing notes.   HISTORY  Chief Complaint Loss of Consciousness    HPI Stephanie Henderson is a 55 y.o. female with past medical history of migraines, fibroids, and GERD who presents to the ED complaining of syncope.  Patient reports that last night she had just finished eating dinner when she started to feel hot, sweaty, and lightheaded.  She stood up to leave the room but does not remember what happened next.  Husband states that she lost consciousness and fell forward, striking her head on the wall.  She was unconscious for a few seconds, but woke up shortly after falling to the ground.  EMS was called and on arrival patient noted to have blood pressure of 70/30.  She went to stand up and had a second syncopal episode, but blood pressure improved with IV fluids during transport.  Patient states that she has been feeling well recently and denies any fevers, cough, chest pain, shortness of breath, vomiting, or diarrhea.  She has had a normal appetite.  She denies any history of cardiac pathology or syncope.        Past Medical History:  Diagnosis Date  . Fibroid   . GERD (gastroesophageal reflux disease) Aug 2012   with esophagitis by EGD  . Hematuria   . Hx of colonic polyps August 2012   repeat due 2016,  5 polyps 2009, clear 2012 Gustavo Lah)  . Menorrhagia    did not tolerate trial of ocps due to migraines  . Migraine headache   . Migraine syndrome    since age 77,  sporadic    Patient Active Problem List   Diagnosis Date Noted  . Chronic SI joint pain 10/24/2019  . Chronic bilateral low back pain without sciatica 10/24/2019  . Insomnia 07/18/2019  . B12 deficiency 07/18/2019  . History of benign breast biopsy  07/17/2019  . Reflux esophagitis 08/02/2018  . Right shoulder pain 07/13/2018  . Frozen shoulder syndrome 06/13/2018  . Encounter for preventive health examination 05/27/2018  . Raynaud phenomenon 05/28/2017  . Generalized anxiety disorder 05/29/2016  . S/P hysterectomy 05/29/2016  . Headache 04/14/2016  . History of mammogram 03/02/2011  . Migraine syndrome   . Hx of colonic polyps   . GERD (gastroesophageal reflux disease) 12/16/2010    Past Surgical History:  Procedure Laterality Date  . BREAST BIOPSY Right 2017   benign  . COLONOSCOPY WITH PROPOFOL N/A 07/31/2018   Procedure: COLONOSCOPY WITH PROPOFOL;  Surgeon: Lollie Sails, MD;  Location: [facility] ENDOSCOPY;  Service: Endoscopy;  Laterality: N/A;  . CYSTOSCOPY    . ESOPHAGOGASTRODUODENOSCOPY (EGD) WITH PROPOFOL N/A 07/31/2018   Procedure: ESOPHAGOGASTRODUODENOSCOPY (EGD) WITH PROPOFOL;  Surgeon: Lollie Sails, MD;  Location: [facility] ENDOSCOPY;  Service: Endoscopy;  Laterality: N/A;  . laparoscopy    . VAGINAL HYSTERECTOMY  6/13   with morcellation    Prior to Admission medications   Medication Sig Start Date End Date Taking? Authorizing Provider  ALPRAZolam Duanne Moron) 0.5 MG tablet Take 1 tablet (0.5 mg total) by mouth at bedtime as needed for anxiety. 05/25/18   Crecencio Mc, MD  cyclobenzaprine (FLEXERIL) 10 MG tablet Take 1 tablet (10 mg total) by mouth at bedtime. 05/25/18   Crecencio Mc, MD  famotidine (PEPCID)  40 MG tablet Take 1 tablet (40 mg total) by mouth daily. 05/25/18   Crecencio Mc, MD  metroNIDAZOLE (FLAGYL) 500 MG tablet Take 1 tablet (500 mg total) by mouth 2 (two) times daily. With food 04/22/20   McLean-Scocuzza, Nino Glow, MD  neomycin-polymyxin-hydrocortisone (CORTISPORIN) OTIC solution Place 4 drops into the right ear 4 (four) times daily. X 4-7 days 04/23/20   McLean-Scocuzza, Nino Glow, MD  pantoprazole (PROTONIX) 40 MG tablet TAKE 1 TABLET (40 MG TOTAL) BY MOUTH DAILY. 07/23/19   Crecencio Mc, MD   sucralfate (CARAFATE) 1 G tablet Take 1 g by mouth 3 (three) times daily as needed.      [provider]  SUMAtriptan (IMITREX) 100 MG tablet Take 1 tablet (100 mg total) by mouth once for 1 dose. May repeat in 2 hours if needed 05/25/18 05/25/18  Crecencio Mc, MD  tiZANidine (ZANAFLEX) 4 MG tablet Take 1 tablet (4 mg total) by mouth every 6 (six) hours as needed for muscle spasms. 10/23/19   Crecencio Mc, MD    Allergies Propoxyphene and Tetracyclines & related  Family History  Problem Relation Age of Onset  . Hyperlipidemia Mother   . Mental illness Mother        depression  . Colon cancer Father   . Depression Father   . Cancer Father 62       mets to lung   . Mental illness Father   . Kidney disease Daughter   . Diabetes Neg Hx   . Heart disease Neg Hx   . Breast cancer Neg Hx   . Ovarian cancer Neg Hx     Social History Social History   Tobacco Use  . Smoking status: Never Smoker  . Smokeless tobacco: Never Used  Vaping Use  . Vaping Use: Never used  Substance Use Topics  . Alcohol use: Yes    Alcohol/week: 4.0 - 5.0 standard drinks    Types: 4 - 5 Standard drinks or equivalent per week    Comment: OCCAS  . Drug use: No    Review of Systems  Constitutional: No fever/chills Eyes: No visual changes. ENT: No sore throat. Cardiovascular: Denies chest pain.  Positive for syncope. Respiratory: Denies shortness of breath. Gastrointestinal: No abdominal pain.  No nausea, no vomiting.  No diarrhea.  No constipation. Genitourinary: Negative for dysuria. Musculoskeletal: Negative for back pain. Skin: Negative for rash. Neurological: Negative for headaches, focal weakness or numbness.  ____________________________________________   PHYSICAL EXAM:  VITAL SIGNS: ED Triage Vitals  Enc Vitals Group     BP 06/15/20 2151 (!) 102/58     Pulse Rate 06/15/20 2151 79     Resp 06/15/20 2151 16     Temp 06/15/20 2151 98.6 F (37 C)     Temp Source 06/16/20  0002 Oral     SpO2 06/15/20 2151 95 %     Weight 06/15/20 2153 108 lb (49 kg)     Height 06/15/20 2153 5\' 2"  (1.575 m)     Head Circumference --      Peak Flow --      Pain Score 06/15/20 2153 5     Pain Loc --      Pain Edu? --      Excl. in [place]? --     Constitutional: Alert and oriented. Eyes: Conjunctivae are normal. Head: Abrasion to frontal scalp and bridge of nose with no bony tenderness or deformities. Nose: No congestion/rhinnorhea. Mouth/Throat: Mucous membranes are  moist. Neck: Normal ROM, no midline cervical spine tenderness. Cardiovascular: Normal rate, regular rhythm. Grossly normal heart sounds. Respiratory: Normal respiratory effort.  No retractions. Lungs CTAB. Gastrointestinal: Soft and nontender. No distention. Genitourinary: deferred Musculoskeletal: No lower extremity tenderness nor edema. Neurologic:  Normal speech and language. No gross focal neurologic deficits are appreciated. Skin:  Skin is warm, dry and intact. No rash noted. Psychiatric: Mood and affect are normal. Speech and behavior are normal.  ____________________________________________   LABS (all labs ordered are listed, but only abnormal results are displayed)  Labs Reviewed  BASIC METABOLIC PANEL - Abnormal; Notable for the following components:      Result Value   Glucose, Bld 145 (*)    All other components within normal limits  CBC - Abnormal; Notable for the following components:   RBC 3.73 (*)    All other components within normal limits  LIPASE, BLOOD  URINALYSIS, COMPLETE (UACMP) WITH MICROSCOPIC  CBG MONITORING, ED  TROPONIN I (HIGH SENSITIVITY)  TROPONIN I (HIGH SENSITIVITY)   ____________________________________________  EKG  ED ECG REPORT I, Blake Divine, the attending physician, personally viewed and interpreted this ECG.   Date: 06/16/2020  EKG Time: 21:56  Rate: 76  Rhythm: normal sinus rhythm  Axis: Normal  Intervals:none  ST&T Change:  None   PROCEDURES  Procedure(s) performed (including Critical Care):  Procedures   ____________________________________________   INITIAL IMPRESSION / ASSESSMENT AND PLAN / ED COURSE       55 year old female with past medical history of migraines, fibroids, and GERD who presents to the ED following syncopal episode at home where she struck her head on the wall.  CT imaging reviewed by me, no obvious intracranial hemorrhage and images are negative for acute process per radiology.  EKG shows no evidence of arrhythmia or ischemia and labs are unremarkable, including 2 sets of troponin.  Patient now states she feels back to normal and blood pressure has remained much improved.  Low suspicion for cardiac etiology as episode sounds most consistent with vasovagal, patient without significant cardiac risk factors.  She is appropriate for discharge home and was provided with referral to cardiology, counseled to return to the ED for new worsening symptoms.  Patient agrees with plan.      ____________________________________________   FINAL CLINICAL IMPRESSION(S) / ED DIAGNOSES  Final diagnoses:  Syncope and collapse     ED Discharge Orders    None       Note:  This document was prepared using Dragon voice recognition software and may include unintentional dictation errors.   Blake Divine, MD 06/16/20 1125

## 2020-06-23 ENCOUNTER — Ambulatory Visit: Payer: 59 | Admitting: Internal Medicine

## 2020-06-23 ENCOUNTER — Encounter: Payer: Self-pay | Admitting: Internal Medicine

## 2020-06-23 ENCOUNTER — Other Ambulatory Visit: Payer: Self-pay | Admitting: Internal Medicine

## 2020-06-23 ENCOUNTER — Other Ambulatory Visit: Payer: Self-pay

## 2020-06-23 VITALS — BP 126/70 | HR 77 | Temp 98.2°F | Ht 62.01 in | Wt 111.2 lb

## 2020-06-23 DIAGNOSIS — R1030 Lower abdominal pain, unspecified: Secondary | ICD-10-CM

## 2020-06-23 DIAGNOSIS — R197 Diarrhea, unspecified: Secondary | ICD-10-CM

## 2020-06-23 DIAGNOSIS — F411 Generalized anxiety disorder: Secondary | ICD-10-CM

## 2020-06-23 DIAGNOSIS — Z87898 Personal history of other specified conditions: Secondary | ICD-10-CM

## 2020-06-23 DIAGNOSIS — R109 Unspecified abdominal pain: Secondary | ICD-10-CM

## 2020-06-23 DIAGNOSIS — Z86010 Personal history of colonic polyps: Secondary | ICD-10-CM | POA: Diagnosis not present

## 2020-06-23 DIAGNOSIS — E538 Deficiency of other specified B group vitamins: Secondary | ICD-10-CM | POA: Diagnosis not present

## 2020-06-23 DIAGNOSIS — R55 Syncope and collapse: Secondary | ICD-10-CM

## 2020-06-23 MED ORDER — HYOSCYAMINE SULFATE 0.125 MG PO TBDP: 0.1250 mg | ORAL_TABLET | Freq: Four times a day (QID) | ORAL | 0 refills | Status: DC | PRN

## 2020-06-23 NOTE — Progress Notes (Signed)
Subjective:  Patient ID: Stephanie Henderson, female    DOB: Oct 30, 1965  Age: 55 y.o. MRN: [id number]  CC: The primary encounter diagnosis was Pain, abdominal, nonspecific. Diagnoses of Syncope and collapse, Diarrhea, unspecified type, B12 deficiency, History of syncope, Hx of colonic polyps, Generalized anxiety disorder, and Lower abdominal pain were also pertinent to this visit.  HPI Stephanie Henderson presents for  ER follow up, medication refill.   This visit occurred during the SARS-CoV-2 public health emergency.  Safety protocols were in place, including screening questions prior to the visit, additional usage of staff PPE, and extensive cleaning of exam room while observing appropriate contact time as indicated for disinfecting solutions.   Treated in ER at [place] on jan 31 after having a witnessed syncopal event at home, which resulted in blunt head trauma . The witnessed  LOC lasted  a few seconds. Husband called EMS and she had a  2nd syncopal event occurring during EMS evaluation while trying to stand up .  orthostatic hypoetnsion reported .  BP was 70/30. She states that the episode occurring after eating dinner,  While still at the dinner table,  And was preceded by abdominal cramping that became intense.  .  Following transport to the ER ,  She had multiple episodes of loose stools during the 15 hour wait,  And continued dull abdominal pain for several days afterward,  But was not evaluated with CT abd during ER evaluation.  She had one episode of  BRBP rectum after the 5th epsiodes of loose stool, but none since .  She was released after ruling out for MI and advised to follow up with Dr Nehemiah Massed,  No referral done.   She reports infrequent episodes of post prandial abdominal pain and loose stools,  Occurring randomly but not more than every 5 or 6 months.   The episodes last less than 48 hours and she has not had an evaluation during one of the episodes.  No known food allergies,  But has  tried avoiding gluten .  Mother has IBS and father had colon CA   Reviewed EGD/colonoscopy by [facility] in march 2020 : small hiatal hernia,  Gastric polyps,  Tortuous colon. No mention of diverticulosis. CT abdomen and pelvis done in 2014 at [facility] for same was negative for GI pathology, noted a complex right ovarian cyst  Determined to be a hemorrhagic cyst whii chresolved on follow up.    Outpatient Medications Prior to Visit  Medication Sig Dispense Refill  . cyclobenzaprine (FLEXERIL) 10 MG tablet Take 1 tablet (10 mg total) by mouth at bedtime. 30 tablet 11  . famotidine (PEPCID) 40 MG tablet Take 1 tablet (40 mg total) by mouth daily. 90 tablet 1  . pantoprazole (PROTONIX) 40 MG tablet TAKE 1 TABLET (40 MG TOTAL) BY MOUTH DAILY. 90 tablet 2  . sucralfate (CARAFATE) 1 G tablet Take 1 g by mouth 3 (three) times daily as needed.    Marland Kitchen tiZANidine (ZANAFLEX) 4 MG tablet Take 1 tablet (4 mg total) by mouth every 6 (six) hours as needed for muscle spasms. 30 tablet 0  . ALPRAZolam (XANAX) 0.5 MG tablet Take 1 tablet (0.5 mg total) by mouth at bedtime as needed for anxiety. 30 tablet 1  . SUMAtriptan (IMITREX) 100 MG tablet Take 1 tablet (100 mg total) by mouth once for 1 dose. May repeat in 2 hours if needed 10 tablet 5  . metroNIDAZOLE (FLAGYL) 500 MG tablet Take 1 tablet (  500 mg total) by mouth 2 (two) times daily. With food (Patient not taking: Reported on 06/23/2020) 14 tablet 0  . neomycin-polymyxin-hydrocortisone (CORTISPORIN) OTIC solution Place 4 drops into the right ear 4 (four) times daily. X 4-7 days (Patient not taking: Reported on 06/23/2020) 10 mL 0   No facility-administered medications prior to visit.    Review of Systems;  Patient denies headache, fevers, malaise, unintentional weight loss, skin rash, eye pain, sinus congestion and sinus pain, sore throat, dysphagia,  hemoptysis , cough, dyspnea, wheezing, chest pain, palpitations, orthopnea, edema, abdominal pain, nausea, melena,  diarrhea, constipation, flank pain, dysuria, hematuria, urinary  Frequency, nocturia, numbness, tingling, seizures,  Focal weakness, Loss of consciousness,  Tremor, insomnia, depression, anxiety, and suicidal ideation.      Objective:  BP 126/70 (BP Location: Left Arm, Patient Position: Sitting)   Pulse 77   Temp 98.2 F (36.8 C)   Ht 5' 2.01" (1.575 m)   Wt 111 lb 3.2 oz (50.4 kg)   LMP 02/20/2011   SpO2 94%   BMI 20.33 kg/m   BP Readings from Last 3 Encounters:  06/23/20 126/70  06/16/20 116/71  04/22/20 116/64    Wt Readings from Last 3 Encounters:  06/23/20 111 lb 3.2 oz (50.4 kg)  06/15/20 108 lb (49 kg)  04/22/20 113 lb 12.8 oz (51.6 kg)    General appearance: alert, cooperative and appears stated age Ears: normal TM's and external ear canals both ears Throat: lips, mucosa, and tongue normal; teeth and gums normal Neck: no adenopathy, no carotid bruit, supple, symmetrical, trachea midline and thyroid not enlarged, symmetric, no tenderness/mass/nodules Back: symmetric, no curvature. ROM normal. No CVA tenderness. Lungs: clear to auscultation bilaterally Heart: regular rate and rhythm, S1, S2 normal, no murmur, click, rub or gallop Abdomen: soft, non-tender; bowel sounds normal; no masses,  no organomegaly Pulses: 2+ and symmetric Skin: Skin color, texture, turgor normal. No rashes or lesions Lymph nodes: Cervical, supraclavicular, and axillary nodes normal.  Lab Results  Component Value Date   HGBA1C 5.4 11/16/2018   HGBA1C 5.8 05/25/2018   HGBA1C 5.6 05/25/2017    Lab Results  Component Value Date   CREATININE 0.69 06/23/2020   CREATININE 0.58 06/15/2020   CREATININE 0.55 05/25/2018    Lab Results  Component Value Date   WBC 8.2 06/23/2020   HGB 13.2 06/23/2020   HCT 39.8 06/23/2020   PLT 281.0 06/23/2020   GLUCOSE 91 06/23/2020   CHOL 194 05/25/2018   TRIG 45.0 05/25/2018   HDL 60.50 05/25/2018   LDLCALC 125 (H) 05/25/2018   ALT 43 (H)  05/25/2018   AST 32 05/25/2018   NA 137 06/23/2020   K 4.2 06/23/2020   CL 99 06/23/2020   CREATININE 0.69 06/23/2020   BUN 11 06/23/2020   CO2 33 (H) 06/23/2020   TSH 1.69 05/27/2016   HGBA1C 5.4 11/16/2018    No results found.  Assessment & Plan:   Problem List Items Addressed This Visit      Unprioritized   Abdominal pain    Intermittent,  With pain and loose stools occurring infrequently , lasting up to 48 hours.  Etiology unclear.  Most recent episode occurred as a result of or caused profound hypotension and syncope, .  Referring to GI for evaluation.  Trial of of hyoscyamine given for prn use       B12 deficiency   Relevant Orders   Vitamin B12 (Completed)   Generalized anxiety disorder    Manifesting as  insomnia.   Reviewed principles of good sleep hygiene. The risks and benefits of benzodiazepine use were discussed with patient today including excessive sedation leading to respiratory depression,  impaired thinking/driving, and addiction.  Patient was advised to avoid concurrent use with alcohol, to use medication only as needed and not to share with others  .       Relevant Medications   ALPRAZolam (XANAX) 0.5 MG tablet   History of syncope    Recent episode occurred in the setting of abdominal pain and profound hypotension.  Given the unclear etiology,  I am recommending a cardiology evaluation to rule out arrythmia as a cause of both       Hx of colonic polyps    Last colonoscopy 2020.  Continue 5 yr follow up given FH of colon CA        Other Visit Diagnoses    Pain, abdominal, nonspecific    -  Primary   Relevant Orders   Ambulatory referral to Gastroenterology   Syncope and collapse       Relevant Orders   Ambulatory referral to Cardiology   Diarrhea, unspecified type       Relevant Orders   Basic metabolic panel (Completed)   Magnesium (Completed)   CBC with Differential/Platelet (Completed)     I40 minutes  Was spent  reviewing patient's current  problems and recent encounters with his specialists, reviewing and ordering  labs and imaging studies, providing counseling on the above mentioned problems , and evaluating patient  In a face to face visit  .   I have discontinued Rekia T. Slone's metroNIDAZOLE and neomycin-polymyxin-hydrocortisone. I am also having her start on hyoscyamine. Additionally, I am having her maintain her sucralfate, SUMAtriptan, cyclobenzaprine, famotidine, pantoprazole, tiZANidine, and ALPRAZolam.  Meds ordered this encounter  Medications  . hyoscyamine (ANASPAZ) 0.125 MG TBDP disintergrating tablet    Sig: Place 1 tablet (0.125 mg total) under the tongue every 6 (six) hours as needed.    Dispense:  30 tablet    Refill:  0  . ALPRAZolam (XANAX) 0.5 MG tablet    Sig: Take 1 tablet (0.5 mg total) by mouth at bedtime as needed for anxiety.    Dispense:  30 tablet    Refill:  5    Medications Discontinued During This Encounter  Medication Reason  . neomycin-polymyxin-hydrocortisone (CORTISPORIN) OTIC solution Error  . metroNIDAZOLE (FLAGYL) 500 MG tablet Error  . ALPRAZolam (XANAX) 0.5 MG tablet Reorder    Follow-up: No follow-ups on file.   Crecencio Mc, MD

## 2020-06-23 NOTE — Patient Instructions (Signed)
You were orthostatic by pulse,  So increase water intake and add some electrolytes  GI referral in progress  Trial of hyoscyamine sublingual for next episode of crampy abdominal pain

## 2020-06-24 ENCOUNTER — Other Ambulatory Visit: Payer: Self-pay | Admitting: Internal Medicine

## 2020-06-24 DIAGNOSIS — R109 Unspecified abdominal pain: Secondary | ICD-10-CM | POA: Insufficient documentation

## 2020-06-24 DIAGNOSIS — Z87898 Personal history of other specified conditions: Secondary | ICD-10-CM | POA: Insufficient documentation

## 2020-06-24 LAB — MAGNESIUM: Magnesium: 2 mg/dL (ref 1.5–2.5)

## 2020-06-24 LAB — BASIC METABOLIC PANEL
BUN: 11 mg/dL (ref 6–23)
CO2: 33 mEq/L — ABNORMAL HIGH (ref 19–32)
Calcium: 10.1 mg/dL (ref 8.4–10.5)
Chloride: 99 mEq/L (ref 96–112)
Creatinine, Ser: 0.69 mg/dL (ref 0.40–1.20)
GFR: 99.4 mL/min (ref >60.00)
Glucose, Bld: 91 mg/dL (ref 70–99)
Potassium: 4.2 mEq/L (ref 3.5–5.1)
Sodium: 137 mEq/L (ref 135–145)

## 2020-06-24 LAB — CBC WITH DIFFERENTIAL/PLATELET
Basophils Absolute: 0.1 10*3/uL (ref 0.0–0.1)
Basophils Relative: 0.9 % (ref 0.0–3.0)
Eosinophils Absolute: 0.1 10*3/uL (ref 0.0–0.7)
Eosinophils Relative: 1.7 % (ref 0.0–5.0)
HCT: 39.8 % (ref 36.0–46.0)
Hemoglobin: 13.2 g/dL (ref 12.0–15.0)
Lymphocytes Relative: 37.3 % (ref 12.0–46.0)
Lymphs Abs: 3 10*3/uL (ref 0.7–4.0)
MCHC: 33.1 g/dL (ref 30.0–36.0)
MCV: 97.8 fl (ref 78.0–100.0)
Monocytes Absolute: 0.4 10*3/uL (ref 0.1–1.0)
Monocytes Relative: 5.1 % (ref 3.0–12.0)
Neutro Abs: 4.5 10*3/uL (ref 1.4–7.7)
Neutrophils Relative %: 55 % (ref 43.0–77.0)
Platelets: 281 10*3/uL (ref 150.0–400.0)
RBC: 4.07 Mil/uL (ref 3.87–5.11)
RDW: 13.4 % (ref 11.5–15.5)
WBC: 8.2 10*3/uL (ref 4.0–10.5)

## 2020-06-24 LAB — VITAMIN B12: Vitamin B-12: 789 pg/mL (ref 211–911)

## 2020-06-24 MED ORDER — ALPRAZOLAM 0.5 MG PO TABS: 0.5000 mg | ORAL_TABLET | Freq: Every evening | ORAL | 5 refills | Status: DC | PRN

## 2020-06-24 NOTE — Assessment & Plan Note (Signed)
Recent episode occurred in the setting of abdominal pain and profound hypotension.  Given the unclear etiology,  I am recommending a cardiology evaluation to rule out arrythmia as a cause of both

## 2020-06-24 NOTE — Assessment & Plan Note (Signed)
Last colonoscopy 2020.  Continue 5 yr follow up given FH of colon CA

## 2020-06-24 NOTE — Assessment & Plan Note (Signed)
Manifesting as insomnia.   Reviewed principles of good sleep hygiene. The risks and benefits of benzodiazepine use were discussed with patient today including excessive sedation leading to respiratory depression,  impaired thinking/driving, and addiction.  Patient was advised to avoid concurrent use with alcohol, to use medication only as needed and not to share with others  .

## 2020-06-24 NOTE — Assessment & Plan Note (Signed)
Intermittent,  With pain and loose stools occurring infrequently , lasting up to 48 hours.  Etiology unclear.  Most recent episode occurred as a result of or caused profound hypotension and syncope, .  Referring to GI for evaluation.  Trial of of hyoscyamine given for prn use

## 2020-06-25 ENCOUNTER — Encounter: Payer: Self-pay | Admitting: Internal Medicine

## 2020-06-25 ENCOUNTER — Ambulatory Visit (INDEPENDENT_AMBULATORY_CARE_PROVIDER_SITE_OTHER): Payer: 59

## 2020-06-25 ENCOUNTER — Other Ambulatory Visit: Payer: Self-pay

## 2020-06-25 ENCOUNTER — Ambulatory Visit: Payer: 59 | Admitting: Internal Medicine

## 2020-06-25 VITALS — BP 94/62 | HR 75 | Ht 62.0 in | Wt 112.5 lb

## 2020-06-25 DIAGNOSIS — R55 Syncope and collapse: Secondary | ICD-10-CM

## 2020-06-25 NOTE — Progress Notes (Signed)
New Outpatient Visit Date: 06/25/2020  Referring Provider: Crecencio Mc, MD [place],  [place] [id number]  Chief Complaint: Syncope  HPI:  Stephanie Henderson is a 55 y.o. female who is being seen today for the evaluation of syncope at the request of Dr. Derrel Nip. She has a history of GERD and migraine headaches.  She had a syncopal episode shortly after eating dinner on 06/16/2020 and lost consciousness for a few seconds.  When EMS arrived, she was noted to have a blood pressure of 70/30.  When she tried to stand up, she passed out again.  She was transported to the ED where blood pressure improved with fluid resuscitation.  ED work-up was unremarkable including negative high-sensitivity troponin I x2.  On the day of her syncopal episode, Stephanie Henderson had been feeling well.  While eating dinner, she suddenly had severe abdominal pain associated with diaphoresis and lightheadedness.  She got up to you go to the bathroom and passed out, striking her head.  She thinks that she was down for only a brief period of time, noting that her husband was on the phone with EMS when she awoke.  She denies prior syncope.  She has not had any chest pain, shortness of breath, palpitations, or edema.  She underwent stress testing in 2011 due to chest pain; this was normal.  She believes that she wore a monitor at that time as well, though details are not available.  [name] reports more stress at home over the last few months.  There have been no recent medication changes.  She is concerned that she may have IBS and that that contributed to her abdominal pain preceding syncope.  --------------------------------------------------------------------------------------------------  Cardiovascular History & Procedures: Cardiovascular Problems:  Syncope  Risk Factors:  None  Cath/PCI:  None  CV Surgery:  None  EP Procedures and Devices:  None  Non-Invasive Evaluation(s):  Exercise  MPI (07/07/2009): Normal study without ischemia or scar.  Excellent exercise tolerance.  LVEF 70%.  Recent CV Pertinent Labs: Lab Results  Component Value Date   CHOL 194 05/25/2018   CHOL 170 01/14/2016   HDL 60.50 05/25/2018   HDL 56 01/14/2016   LDLCALC 125 (H) 05/25/2018   LDLCALC 106 (H) 01/14/2016   TRIG 45.0 05/25/2018   CHOLHDL 3 05/25/2018   K 4.2 06/23/2020   MG 2.0 06/23/2020   BUN 11 06/23/2020   BUN 14 01/14/2015   CREATININE 0.69 06/23/2020    --------------------------------------------------------------------------------------------------  Past Medical History:  Diagnosis Date  . Fibroid   . GERD (gastroesophageal reflux disease) Aug 2012   with esophagitis by EGD  . Hematuria   . Hx of colonic polyps August 2012   repeat due 2016,  5 polyps 2009, clear 2012 Gustavo Lah)  . Menorrhagia    did not tolerate trial of ocps due to migraines  . Migraine headache   . Migraine syndrome    since age 45,  sporadic    Past Surgical History:  Procedure Laterality Date  . BREAST BIOPSY Right 2017   benign  . COLONOSCOPY WITH PROPOFOL N/A 07/31/2018   Procedure: COLONOSCOPY WITH PROPOFOL;  Surgeon: Lollie Sails, MD;  Location: [facility] ENDOSCOPY;  Service: Endoscopy;  Laterality: N/A;  . CYSTOSCOPY    . ESOPHAGOGASTRODUODENOSCOPY (EGD) WITH PROPOFOL N/A 07/31/2018   Procedure: ESOPHAGOGASTRODUODENOSCOPY (EGD) WITH PROPOFOL;  Surgeon: Lollie Sails, MD;  Location[facility] ENDOSCOPY;  Service: Endoscopy;  Laterality: N/A;  . laparoscopy    .  VAGINAL HYSTERECTOMY  6/13   with morcellation    Current Meds  Medication Sig  . ALPRAZolam (XANAX) 0.5 MG tablet Take 1 tablet (0.5 mg total) by mouth at bedtime as needed for anxiety.  . cyclobenzaprine (FLEXERIL) 10 MG tablet Take 1 tablet (10 mg total) by mouth at bedtime.  . famotidine (PEPCID) 40 MG tablet Take 1 tablet (40 mg total) by mouth daily.  . hyoscyamine (ANASPAZ) 0.125 MG TBDP disintergrating tablet Place 1  tablet (0.125 mg total) under the tongue every 6 (six) hours as needed.  . pantoprazole (PROTONIX) 40 MG tablet TAKE 1 TABLET (40 MG TOTAL) BY MOUTH DAILY.  Marland Kitchen sucralfate (CARAFATE) 1 G tablet Take 1 g by mouth 3 (three) times daily as needed.  . SUMAtriptan (IMITREX) 100 MG tablet Take 1 tablet (100 mg total) by mouth once for 1 dose. May repeat in 2 hours if needed  . tiZANidine (ZANAFLEX) 4 MG tablet Take 1 tablet (4 mg total) by mouth every 6 (six) hours as needed for muscle spasms.    Allergies: Propoxyphene and Tetracyclines & related  Social History   Tobacco Use  . Smoking status: Never Smoker  . Smokeless tobacco: Never Used  Vaping Use  . Vaping Use: Never used  Substance Use Topics  . Alcohol use: Yes    Alcohol/week: 3.0 standard drinks    Types: 3 Glasses of wine per week  . Drug use: No    Family History  Problem Relation Age of Onset  . Hyperlipidemia Mother   . Mental illness Mother        depression  . Dementia Mother   . Colon cancer Father   . Depression Father   . Cancer Father 75       colon CA  . Mental illness Father   . Kidney disease Daughter   . Diabetes Neg Hx   . Heart disease Neg Hx   . Breast cancer Neg Hx   . Ovarian cancer Neg Hx     Review of Systems: A 12-system review of systems was performed and was negative except as noted in the HPI.  --------------------------------------------------------------------------------------------------  Physical Exam: BP 94/62 (BP Location: Right Arm, Patient Position: Sitting, Cuff Size: Normal)   Pulse 75   Ht 5\' 2"  (1.575 m)   Wt 112 lb 8 oz (51 kg)   LMP 02/20/2011   BMI 20.58 kg/m    Position Blood pressure (mmHg) Heart rate (bpm)  Lying 104/64 74  Sitting 103/66 78  Standing 104/68 81  Standing (3 minutes) 104/68 78    General: NAD. HEENT: No conjunctival pallor or scleral icterus. Facemask in place. Neck: Supple without lymphadenopathy, thyromegaly, JVD, or HJR. No carotid  bruit. Lungs: Normal work of breathing. Clear to auscultation bilaterally without wheezes or crackles. Heart: Regular rate and rhythm without murmurs, rubs, or gallops. Non-displaced PMI. Abd: Bowel sounds present. Soft, NT/ND without hepatosplenomegaly Ext: No lower extremity edema. Radial, PT, and DP pulses are 2+ bilaterally Skin: Warm and dry without rash. Neuro: CNIII-XII intact. Strength and fine-touch sensation intact in upper and lower extremities bilaterally. Psych: Normal mood and affect.  EKG: Normal sinus rhythm with short PR interval and sinus arrhythmia.  Otherwise, no significant abnormality.  Lab Results  Component Value Date   WBC 8.2 06/23/2020   HGB 13.2 06/23/2020   HCT 39.8 06/23/2020   MCV 97.8 06/23/2020   PLT 281.0 06/23/2020    Lab Results  Component Value Date   NA 137  06/23/2020   K 4.2 06/23/2020   CL 99 06/23/2020   CO2 33 (H) 06/23/2020   BUN 11 06/23/2020   CREATININE 0.69 06/23/2020   GLUCOSE 91 06/23/2020   ALT 43 (H) 05/25/2018    Lab Results  Component Value Date   CHOL 194 05/25/2018   HDL 60.50 05/25/2018   LDLCALC 125 (H) 05/25/2018   TRIG 45.0 05/25/2018   CHOLHDL 3 05/25/2018     --------------------------------------------------------------------------------------------------  ASSESSMENT AND PLAN: Syncope: Single syncopal episode in late January is most consistent with vasovagal syncope in the setting of acute, severe abdominal pain.  ED work-up at that time was unrevealing.  EKG today is notable for slightly short PR interval but otherwise no significant abnormalities.  Orthostatic vital signs are negative.  Physical exam is also normal.  I have recommended that we obtain a 14-day event monitor as well as transthoracic echocardiogram.  We will defer ischemia evaluation given the lack of anginal symptoms.  I have advised Ms. Nee to refrain from driving or operating heavy machinery pending aforementioned work-up.  Follow-up:  Return to clinic in 6 weeks.  Nelva Bush, MD 06/26/2020 6:39 AM

## 2020-06-25 NOTE — Patient Instructions (Signed)
Medication Instructions:  Your physician recommends that you continue on your current medications as directed. Please refer to the Current Medication list given to you today.  *If you need a refill on your cardiac medications before your next appointment, please call your pharmacy*   Lab Work: None ordered If you have labs (blood work) drawn today and your tests are completely normal, you will receive your results only by: Marland Kitchen MyChart Message (if you have MyChart) OR . A paper copy in the mail If you have any lab test that is abnormal or we need to change your treatment, we will call you to review the results.   Testing/Procedures: Your physician has requested that you have an echocardiogram. Echocardiography is a painless test that uses sound waves to create images of your heart. It provides your doctor with information about the size and shape of your heart and how well your heart's chambers and valves are working. This procedure takes approximately one hour. There are no restrictions for this procedure.  Your physician has recommended that you wear an ZIO AT monitor for 14 days. Zio AT monitors are medical devices that record the heart's electrical activity. Doctors most often [place] these monitors to diagnose arrhythmias. Arrhythmias are problems with the speed or rhythm of the heartbeat. The monitor is a small, portable device. You can wear one while you do your normal daily activities. This is usually used to diagnose what is causing palpitations/syncope (passing out).     Follow-Up: At [facility], you and your health needs are our priority.  As part of our continuing mission to provide you with exceptional heart care, we have created designated Provider Care Teams.  These Care Teams include your primary Cardiologist (physician) and Advanced Practice Providers (APPs -  Physician Assistants and Nurse Practitioners) who all work together to provide you with the care you need, when you need  it.  We recommend signing up for the patient portal called "MyChart".  Sign up information is provided on this After Visit Summary.  MyChart is used to connect with patients for Virtual Visits (Telemedicine).  Patients are able to view lab/test results, encounter notes, upcoming appointments, etc.  Non-urgent messages can be sent to your provider as well.   To learn more about what you can do with MyChart, go to [email or web address].    Your next appointment:   6 week(s)  The format for your next appointment:   In Person  Provider:   You may see Nelva Bush, MD or one of the following Advanced Practice Providers on your designated Care Team:    Murray Hodgkins, NP  Christell Faith, PA-C  Marrianne Mood, PA-C  Cadence Snowslip, [place]  Laurann Montana, NP    Other Instructions N/A

## 2020-06-26 ENCOUNTER — Encounter: Payer: Self-pay | Admitting: Internal Medicine

## 2020-06-26 DIAGNOSIS — R55 Syncope and collapse: Secondary | ICD-10-CM | POA: Diagnosis not present

## 2020-06-27 ENCOUNTER — Encounter: Payer: Self-pay | Admitting: Gastroenterology

## 2020-07-10 ENCOUNTER — Other Ambulatory Visit: Payer: Self-pay

## 2020-07-10 ENCOUNTER — Ambulatory Visit (INDEPENDENT_AMBULATORY_CARE_PROVIDER_SITE_OTHER): Payer: 59

## 2020-07-10 DIAGNOSIS — R55 Syncope and collapse: Secondary | ICD-10-CM | POA: Diagnosis not present

## 2020-07-10 LAB — ECHOCARDIOGRAM COMPLETE
Area-P 1/2: 3.97 cm2
S' Lateral: 3.1 cm

## 2020-07-11 ENCOUNTER — Telehealth: Payer: Self-pay | Admitting: *Deleted

## 2020-07-11 NOTE — Telephone Encounter (Signed)
No answer. Left message to call back.  Included in voice mail message that results are on MyChart for patient viewing.

## 2020-07-11 NOTE — Telephone Encounter (Signed)
-----   Message from Nelva Bush, MD sent at 07/11/2020 10:04 AM EST ----- Echocardiogram is normal.  We will follow-up after completion of event monitor.

## 2020-07-18 ENCOUNTER — Telehealth: Payer: Self-pay | Admitting: *Deleted

## 2020-07-18 NOTE — Telephone Encounter (Signed)
Left voicemail message to call back for review of results.  

## 2020-07-18 NOTE — Telephone Encounter (Signed)
-----   Message from Nelva Bush, MD sent at 07/18/2020  9:53 AM EST ----- Please let Ms. Stonerock know that her event monitor showed a few extra beats but no significant arrhythmia to explain her recent syncopal episode.  She should f/u as planned to reassess her symptoms and determine if additional testing/intervention is needed.

## 2020-07-18 NOTE — Telephone Encounter (Signed)
Reviewed results and recommendations with patient and she verbalized understanding.

## 2020-07-21 ENCOUNTER — Encounter: Payer: No Typology Code available for payment source | Admitting: Internal Medicine

## 2020-07-30 IMAGING — DX DG SHOULDER 2+V*R*
3 series · 3 of 3 positions shown · non-contrast
Comparison: None.

CLINICAL DATA: Right shoulder pain

EXAM:
RIGHT SHOULDER - 2+ VIEW

[grashey]
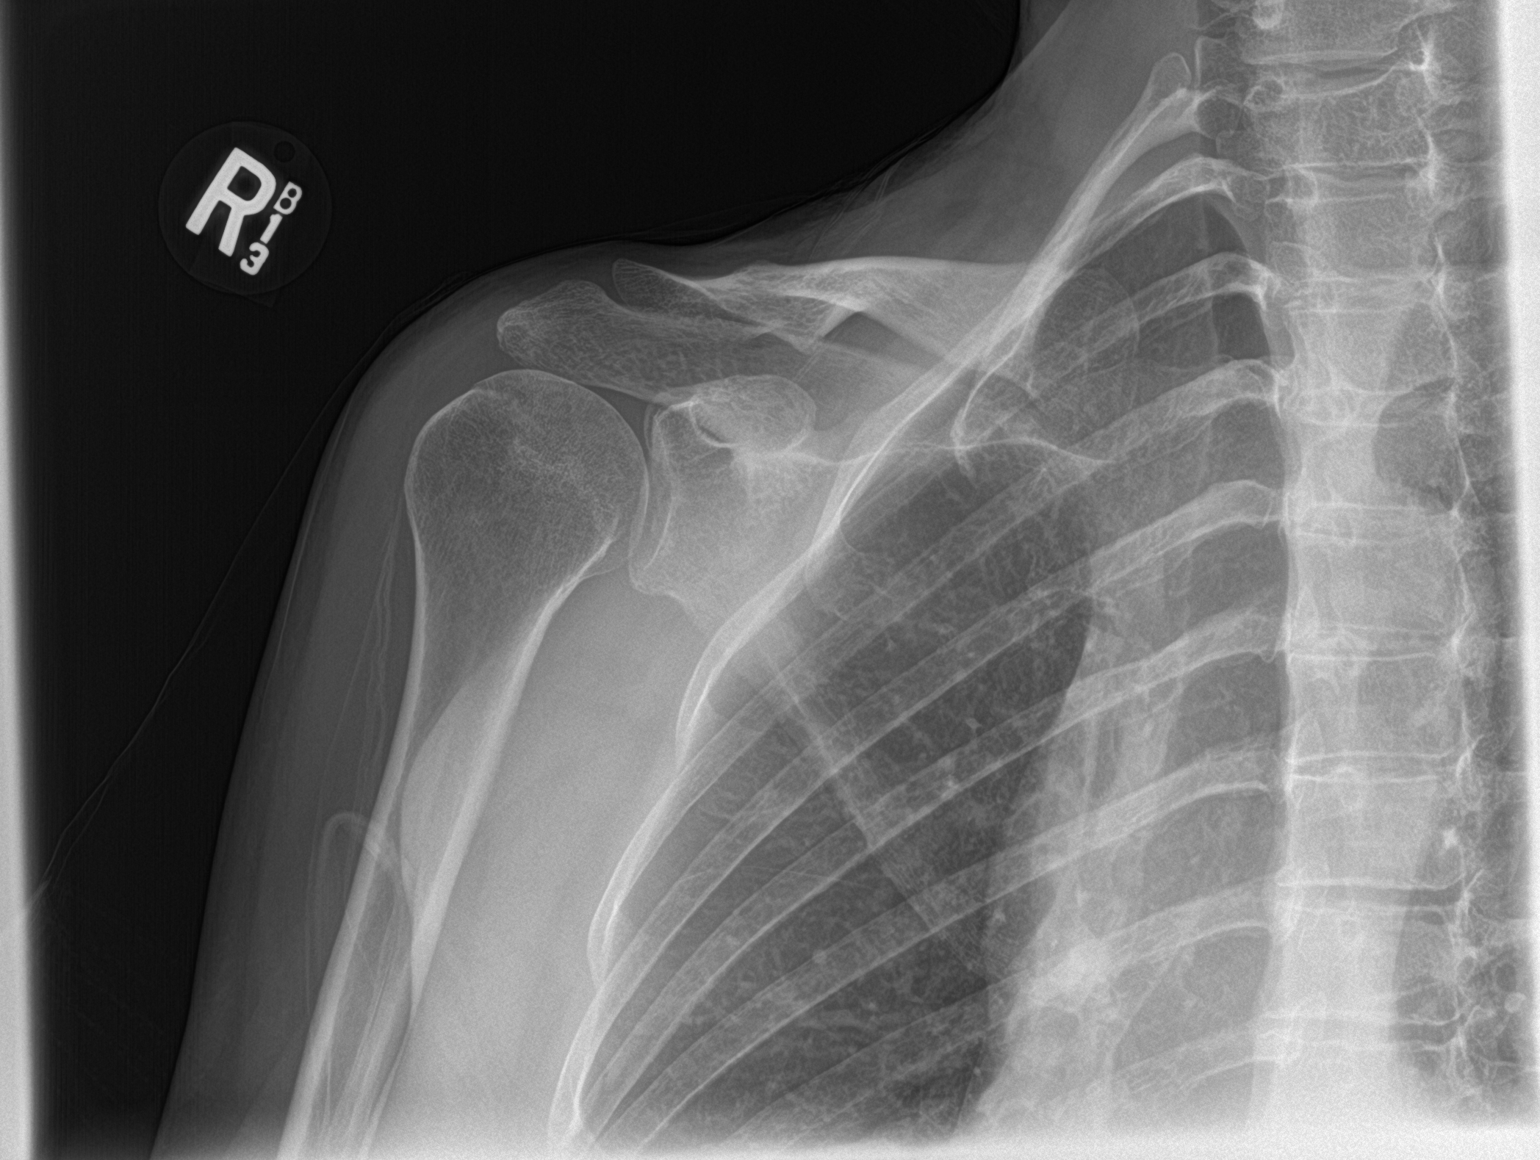

[y view]
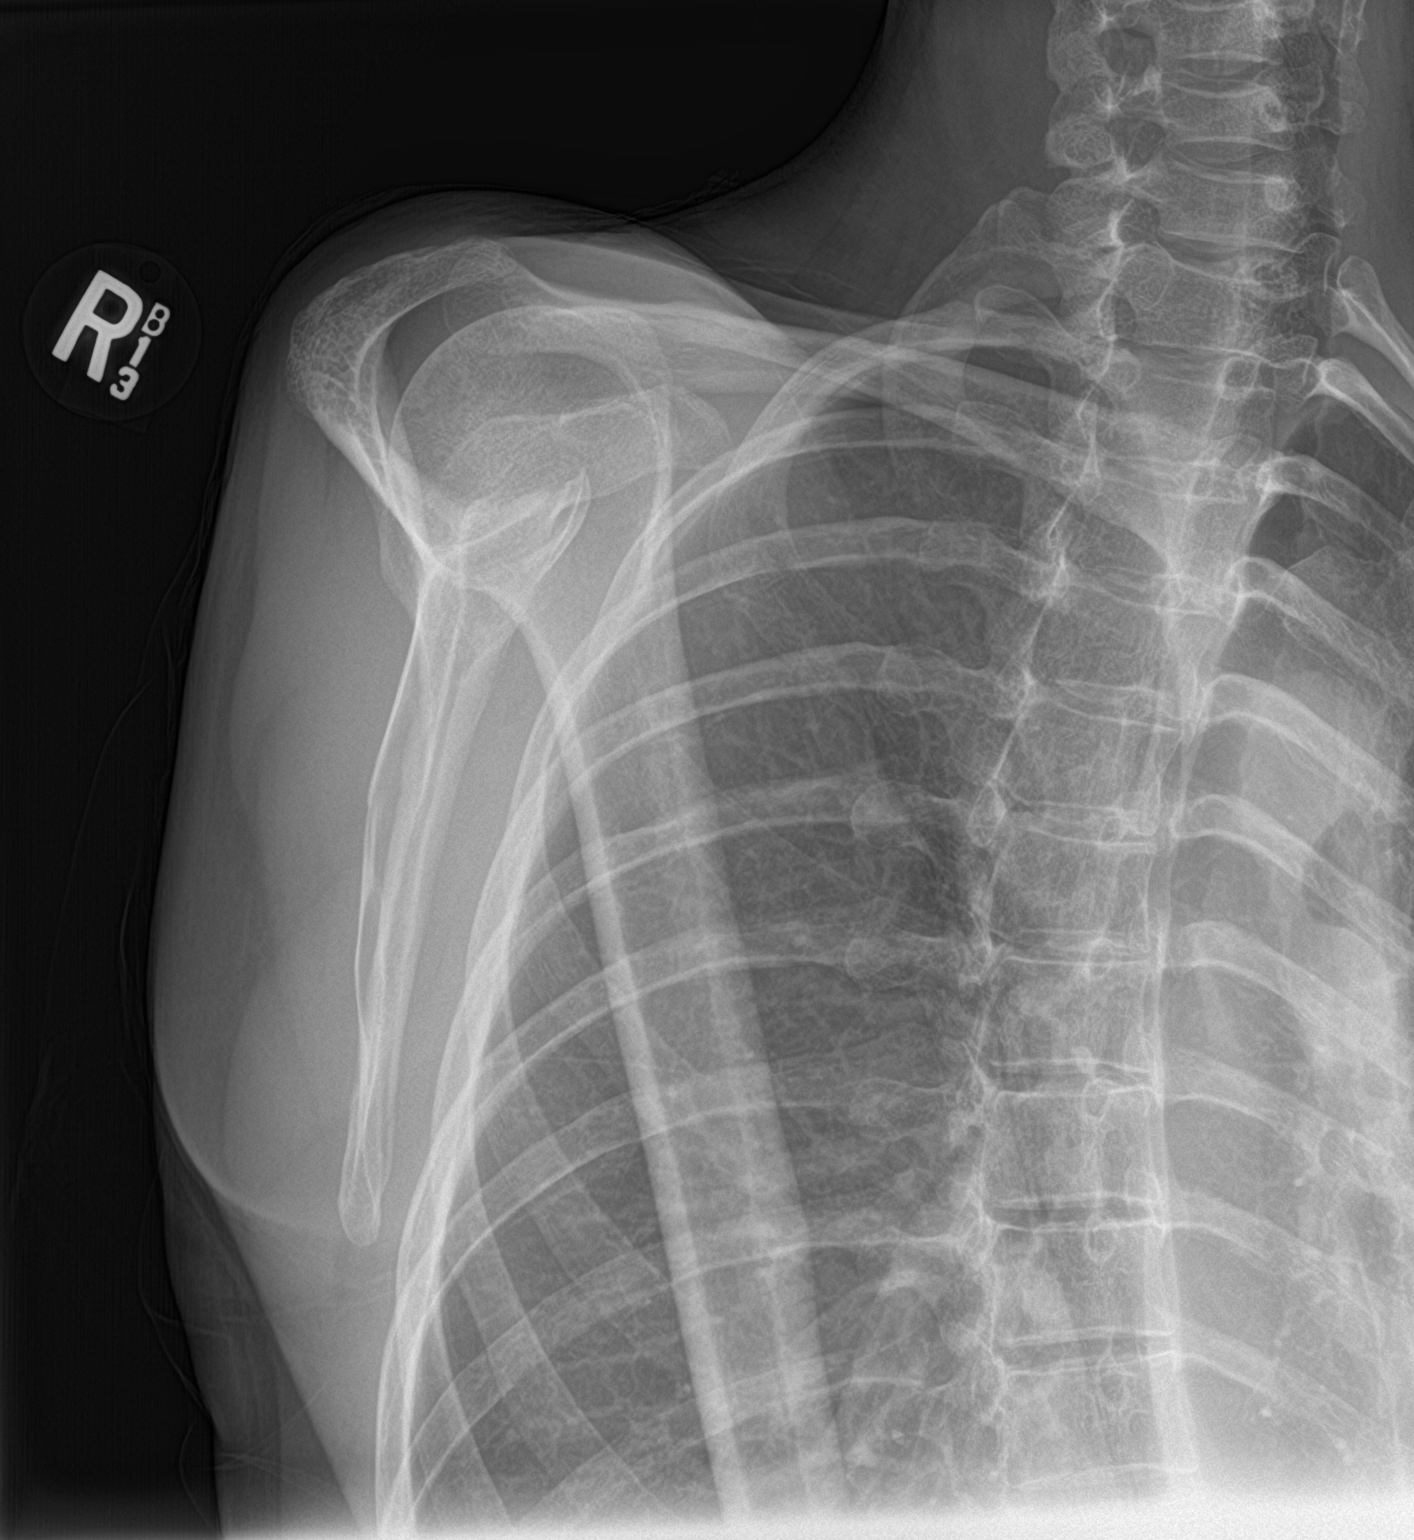

[shoulder axial]
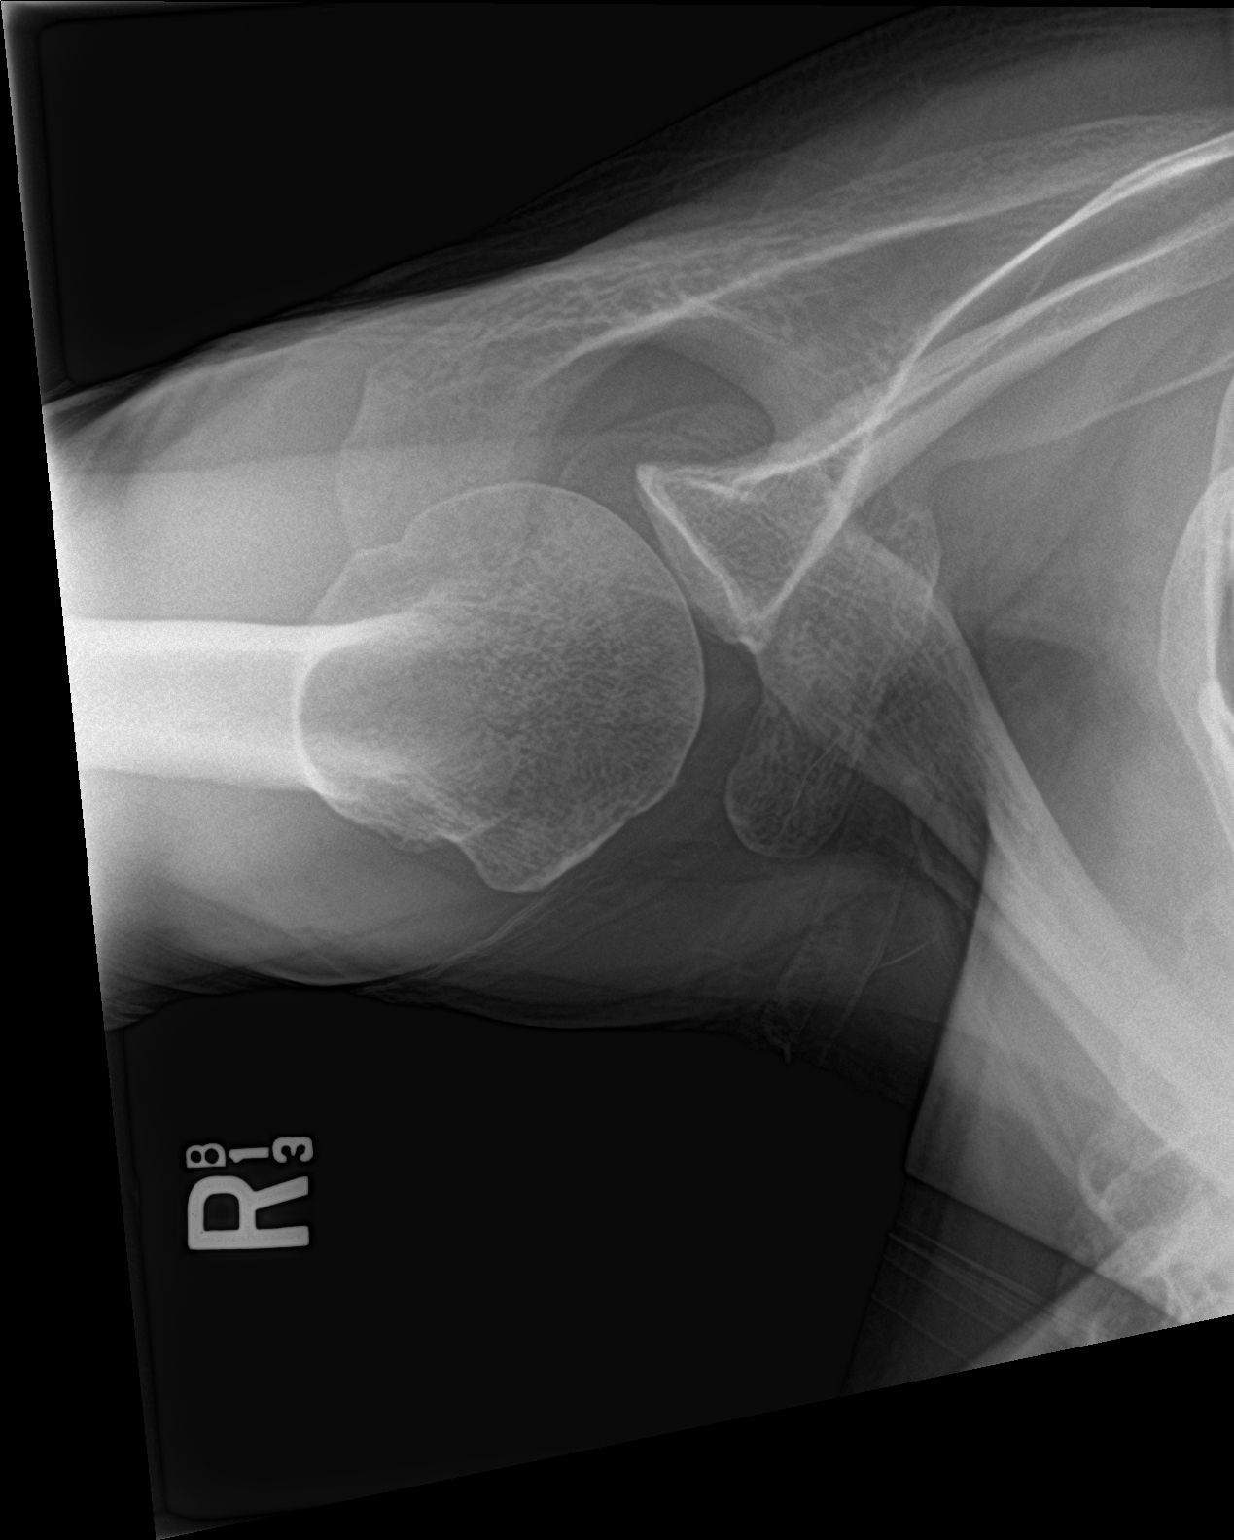

[3 of 3 positions shown; findings below may reference images not displayed]

FINDINGS: There is no evidence of fracture or dislocation. There is no
evidence of arthropathy or other focal bone abnormality. Soft
tissues are unremarkable.
IMPRESSION: Negative.

## 2020-08-05 IMAGING — MG DIGITAL DIAGNOSTIC BILATERAL MAMMOGRAM WITH TOMO AND CAD
8 series · 8 of 24 positions shown · non-contrast
Comparison: 06/16/2017 and earlier

CLINICAL DATA: On recent physical exam, a 3 millimeter mass was
palpated along the LOWER anterior margin of the RIGHT axilla. The
patient is not able to palpate anything, herself.

EXAM:
DIGITAL DIAGNOSTIC BILATERAL MAMMOGRAM WITH CAD AND TOMO
ULTRASOUND RIGHT AXILLA

[L MLO synth-2D]
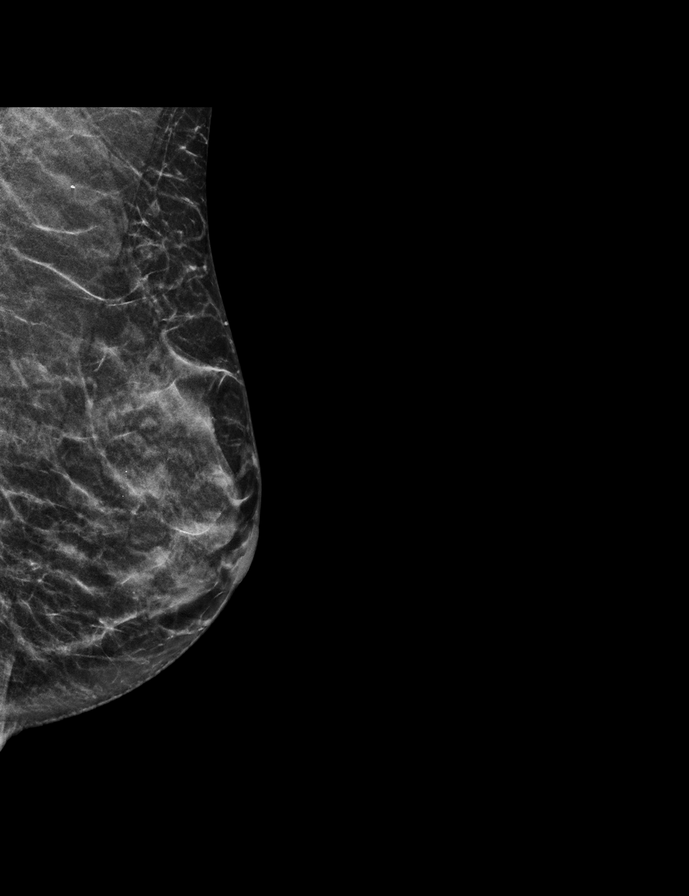

[R CC synth-2D]
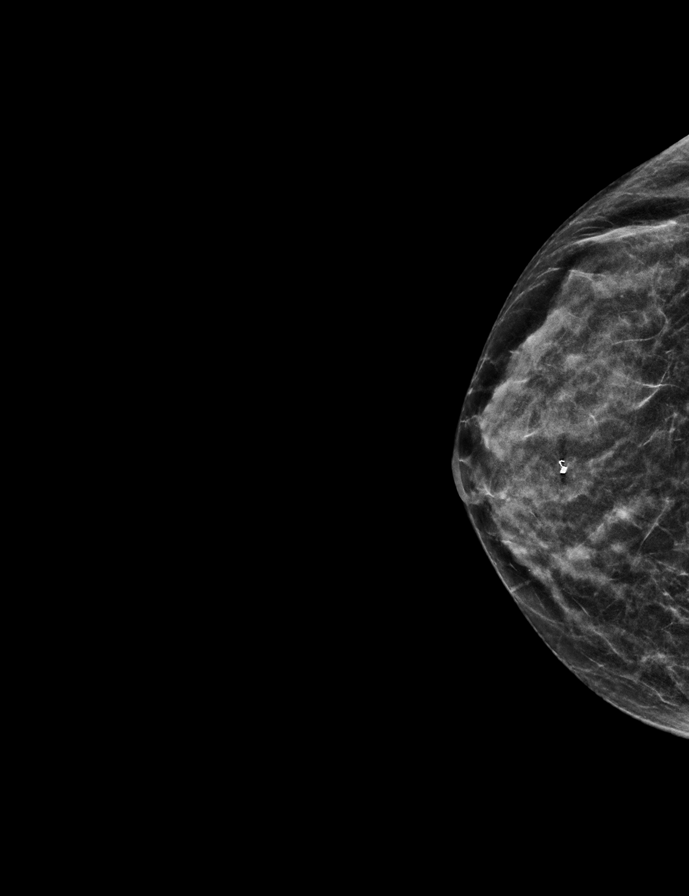

[L CC synth-2D]
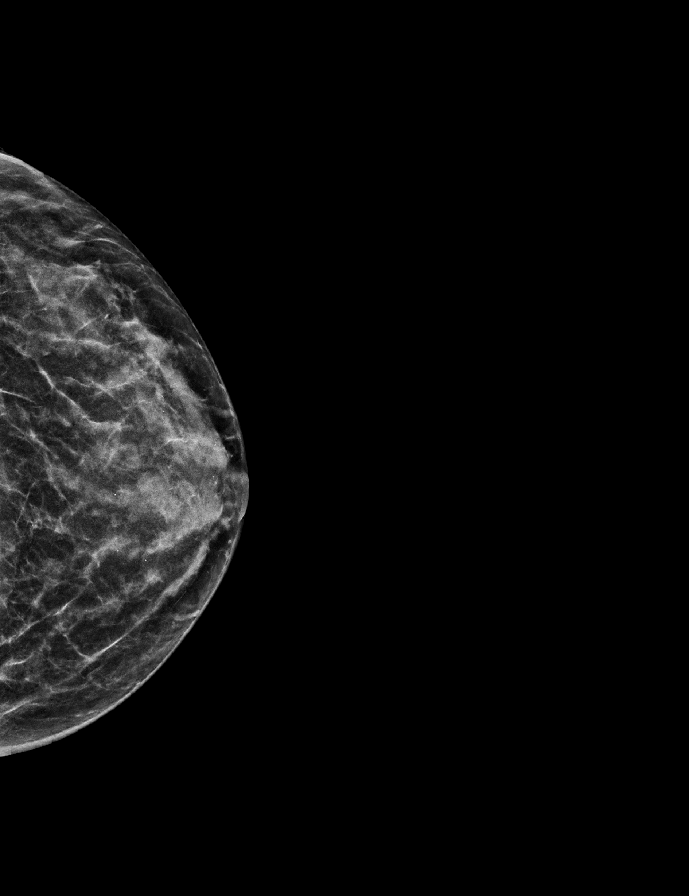

[R MLO synth-2D]
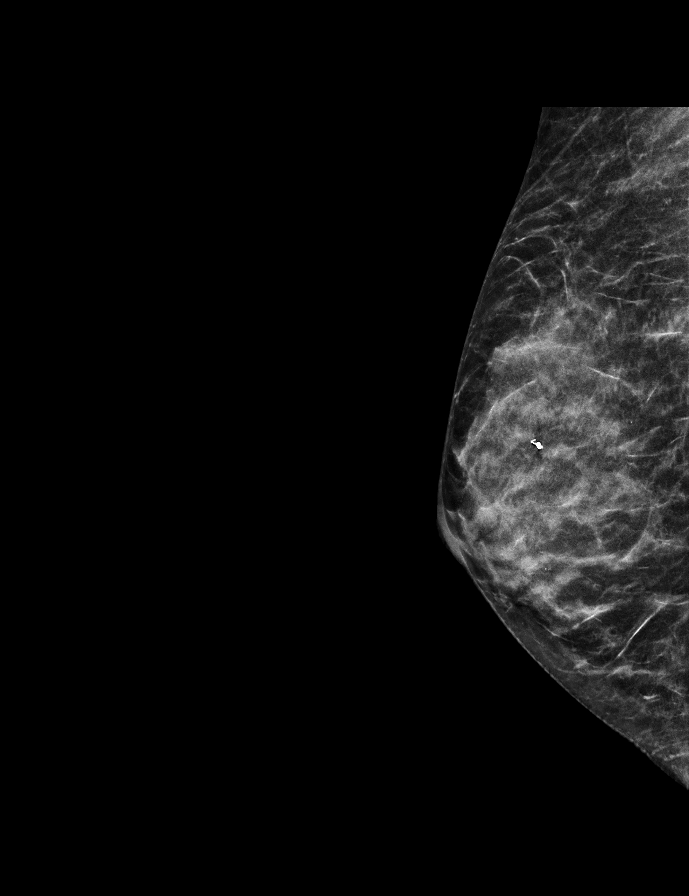

[L MLO tomo · tomo slice 21/42.0]
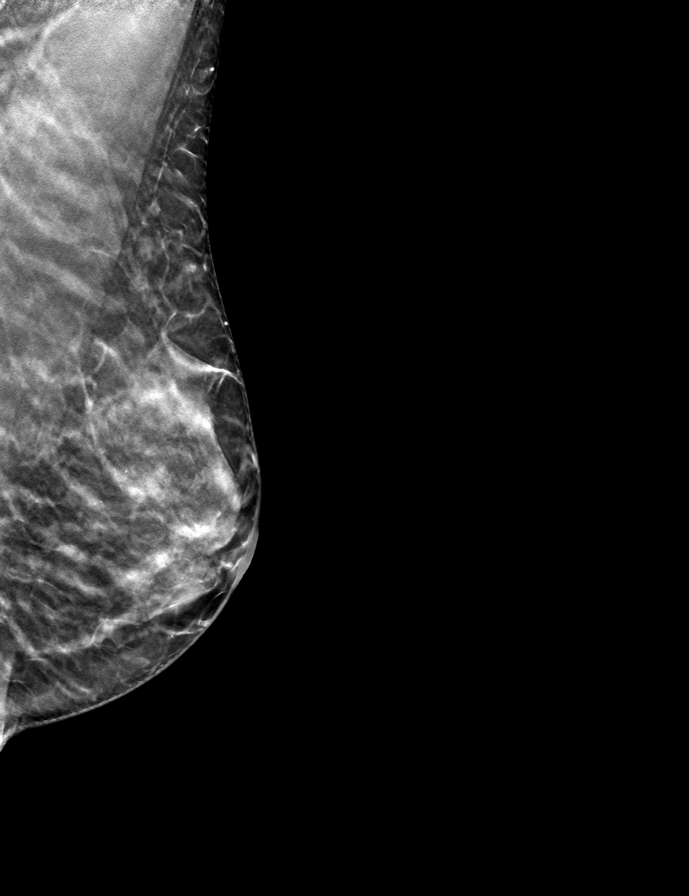

[R CC tomo · tomo slice 21/42.0]
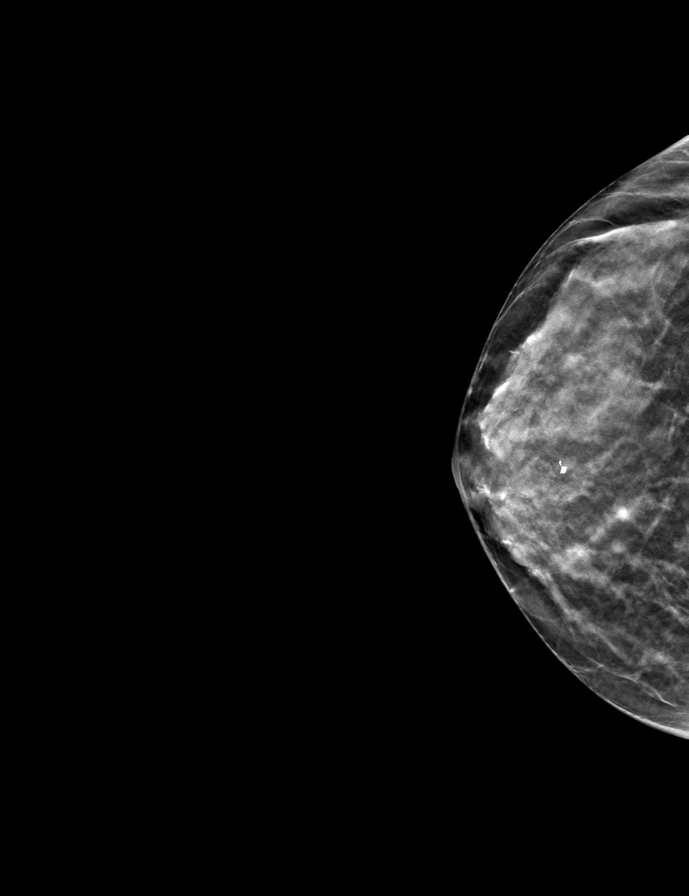

[L CC tomo · tomo slice 21/40.0]
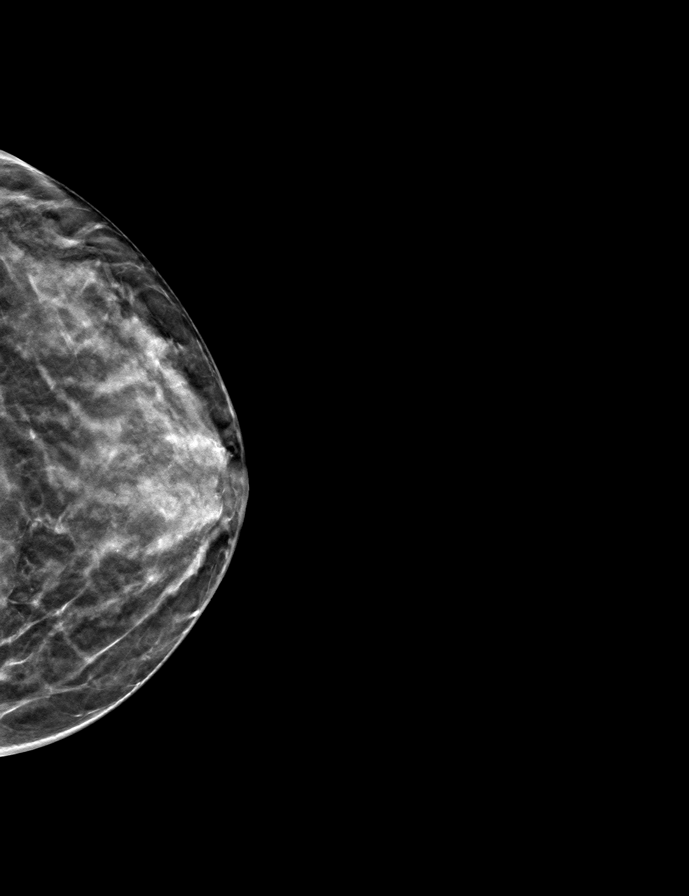

[R MLO tomo · tomo slice 20/39.0]
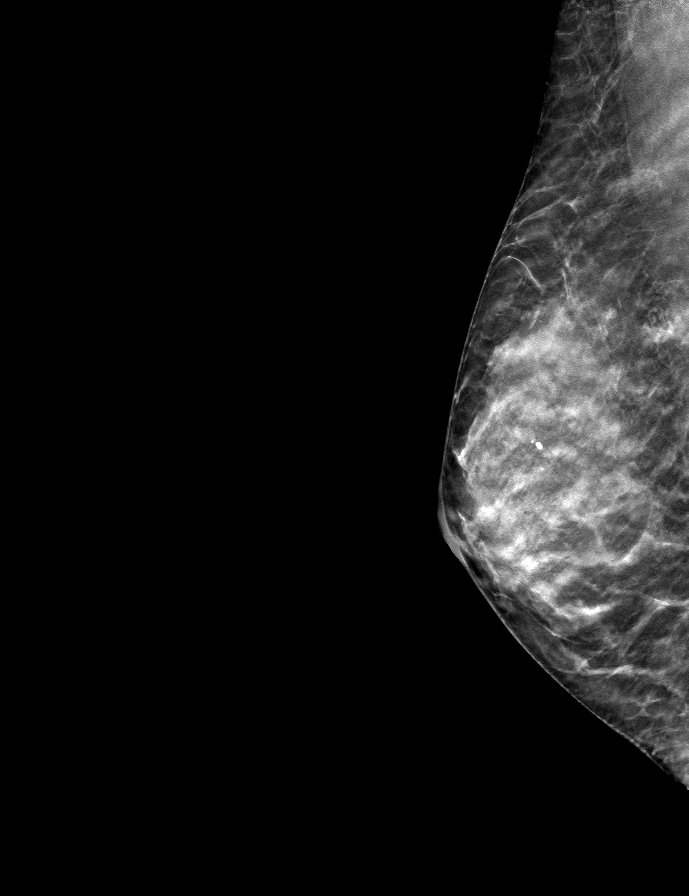

[8 of 24 positions shown; findings below may reference images not displayed]

ACR Breast Density Category c: The breast tissue is heterogeneously
dense, which may obscure small masses.
FINDINGS: No suspicious mass, distortion, or microcalcifications are
identified to suggest presence of malignancy.

Mammographic images were processed with CAD.

On physical exam, I palpate no discrete mass along the anterior
margin of the RIGHT axilla.

Targeted ultrasound is performed, showing normal appearing axillary
contents, including lymph nodes with normal morphology.
Specifically, it no ultrasound abnormality identified along the
pectoralis margin along the anterior aspect of the axilla.
IMPRESSION: No mammographic or ultrasound evidence for malignancy.

RECOMMENDATION:
Screening mammogram in one year.(Code:[id number]-O-ZC4)

I have discussed the findings and recommendations with the patient.
Results were also provided in writing at the conclusion of the
visit. If applicable, a reminder letter will be sent to the patient
regarding the next appointment.

BI-RADS CATEGORY  2: Benign.

## 2020-08-06 ENCOUNTER — Ambulatory Visit: Payer: 59 | Admitting: Family

## 2020-08-06 ENCOUNTER — Other Ambulatory Visit: Payer: Self-pay

## 2020-08-06 ENCOUNTER — Encounter: Payer: Self-pay | Admitting: Family

## 2020-08-06 VITALS — BP 90/60 | HR 77 | Ht 62.0 in | Wt 113.0 lb

## 2020-08-06 DIAGNOSIS — R55 Syncope and collapse: Secondary | ICD-10-CM | POA: Diagnosis not present

## 2020-08-06 NOTE — Progress Notes (Signed)
Office Visit    Patient Name: Stephanie Henderson Date of Encounter: 08/06/2020  PCP:  Crecencio Mc, MD   [facility]  Cardiologist:  Nelva Bush, MD  Advanced Practice Provider:  No care team member to display Electrophysiologist:  None  Chief Complaint    Stephanie Henderson is a 55 y.o. female with a hx of syncope, GERD, migraine presents today for follow up after cardiac testing.   Past Medical History    Past Medical History:  Diagnosis Date  . Fibroid   . GERD (gastroesophageal reflux disease) Aug 2012   with esophagitis by EGD  . Hematuria   . Hx of colonic polyps August 2012   repeat due 2016,  5 polyps 2009, clear 2012 Gustavo Lah)  . Menorrhagia    did not tolerate trial of ocps due to migraines  . Migraine headache   . Migraine syndrome    since age 13,  sporadic   Past Surgical History:  Procedure Laterality Date  . BREAST BIOPSY Right 2017   benign  . COLONOSCOPY WITH PROPOFOL N/A 07/31/2018   Procedure: COLONOSCOPY WITH PROPOFOL;  Surgeon: Lollie Sails, MD;  Location: [facility] ENDOSCOPY;  Service: Endoscopy;  Laterality: N/A;  . CYSTOSCOPY    . ESOPHAGOGASTRODUODENOSCOPY (EGD) WITH PROPOFOL N/A 07/31/2018   Procedure: ESOPHAGOGASTRODUODENOSCOPY (EGD) WITH PROPOFOL;  Surgeon: Lollie Sails, MD;  Location: [facility] ENDOSCOPY;  Service: Endoscopy;  Laterality: N/A;  . laparoscopy    . VAGINAL HYSTERECTOMY  6/13   with morcellation    Allergies  Allergies  Allergen Reactions  . Propoxyphene     Other reaction(s): Other (See Comments) Unknown  . Tetracyclines & Related Diarrhea and Nausea Only    History of Present Illness    Stephanie Henderson is a 55 y.o. female with a hx of syncope, GERD, migraine last seen 06/25/20 by Dr. Saunders Revel. She was seen in consult for syncope.   Syncopal episode 06/16/20 shortly after eating dinner after sensation of stomach cramping with loss of consciousness for a few seconds witnessed by her  husband. 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  As part of syncope workup she had echocardiogram 07/10/20 with normal LVEF, no RWMA, and no signfiicant valvular abnormalities. She wore a 14 day ZIO monitor with predominantly NSR, rare PAC/PVC (<1%) and 2 runs of atrial tachycardia which were asymptomatic.   She presents today for follow up. Reviewed cardiac testing in detail and she was reassured by result. No recurrent syncope. Has upcoming evaluation with GI and is hopeful for some answers as she wonders whether IBS and significant vasovagal reaction caused her previous syncopal episode. Does endorse occasional lightheadedness with quick position changes. Her SBP for many years has been <110. Does share that she has a significant caffeine intake and does not drink much water.   Reports no shortness of breath nor dyspnea on exertion. Reports no chest pain, pressure, or tightness. No edema, orthopnea, PND. Reports occasional palpitations when laying down to sleep which are not associated with pain or shortness of breath and are overall not bothersome..    EKGs/Labs/Other Studies Reviewed:   The following studies were reviewed today:  ZIO Monitor 07/16/20  The patient was monitored for 14 days.  The predominant rhyhtm was sinus with an average rate of 76 bpm (range 45-153 bpm in sinus).  There were rare PAC's and PVC's. Two atrial runs lasting up to 17 beats occurred, with  a maximum rate of 167 bpm.  No sustained arrhythmia or prolonged pause was observed.  There were no patient triggered events.   Predominantly sinus rhythm with rare PAC's and PVC's, as well as two brief episodes of PSVT.  Echo 07/10/20  1. Left ventricular ejection fraction, by estimation, is 55 to 60%. The  left ventricle has normal function. The left ventricle has no regional  wall motion abnormalities. Left ventricular diastolic parameters were  normal.   2. Right ventricular  systolic function is normal. The right ventricular  size is normal. There is normal pulmonary artery systolic pressure.   3. The mitral valve is normal in structure. No evidence of mitral valve  regurgitation. No evidence of mitral stenosis.   4. The aortic valve was not well visualized. Aortic valve regurgitation  is not visualized. No aortic stenosis is present.   5. The inferior vena cava is normal in size with <50% respiratory  variability, suggesting right atrial pressure of 8 mmHg.   EKG:  No EKG today  Recent Labs: 06/23/2020: BUN 11; Creatinine, Ser 0.69; Hemoglobin 13.2; Magnesium 2.0; Platelets 281.0; Potassium 4.2; Sodium 137  Recent Lipid Panel    Component Value Date/Time   CHOL 194 05/25/2018 0949   CHOL 170 01/14/2016 0845   TRIG 45.0 05/25/2018 0949   HDL 60.50 05/25/2018 0949   HDL 56 01/14/2016 0845   CHOLHDL 3 05/25/2018 0949   VLDL 9.0 05/25/2018 0949   LDLCALC 125 (H) 05/25/2018 0949   LDLCALC 106 (H) 01/14/2016 0845    Home Medications   Current Meds  Medication Sig  . ALPRAZolam (XANAX) 0.5 MG tablet Take 1 tablet (0.5 mg total) by mouth at bedtime as needed for anxiety.  . cyclobenzaprine (FLEXERIL) 10 MG tablet Take 1 tablet (10 mg total) by mouth at bedtime.  . famotidine (PEPCID) 40 MG tablet Take 1 tablet (40 mg total) by mouth daily.  . hyoscyamine (ANASPAZ) 0.125 MG TBDP disintergrating tablet Place 1 tablet (0.125 mg total) under the tongue every 6 (six) hours as needed.  . pantoprazole (PROTONIX) 40 MG tablet TAKE 1 TABLET (40 MG TOTAL) BY MOUTH DAILY.  Marland Kitchen sucralfate (CARAFATE) 1 G tablet Take 1 g by mouth 3 (three) times daily as needed.  . SUMAtriptan (IMITREX) 100 MG tablet Take 1 tablet (100 mg total) by mouth once for 1 dose. May repeat in 2 hours if needed  . tiZANidine (ZANAFLEX) 4 MG tablet Take 1 tablet (4 mg total) by mouth every 6 (six) hours as needed for muscle spasms.     Review of Systems  All other systems reviewed and are  otherwise negative except as noted above.  Physical Exam    VS:  BP 90/60 (BP Location: Left Arm, Patient Position: Sitting, Cuff Size: Normal)   Pulse 77   Ht 5\' 2"  (1.575 m)   Wt 113 lb (51.3 kg)   LMP 02/20/2011   SpO2 98%   BMI 20.67 kg/m  , BMI Body mass index is 20.67 kg/m.  Wt Readings from Last 3 Encounters:  08/06/20 113 lb (51.3 kg)  06/25/20 112 lb 8 oz (51 kg)  06/23/20 111 lb 3.2 oz (50.4 kg)     GEN: Well nourished, well developed, in no acute distress. HEENT: normal. Neck: Supple, no JVD, carotid bruits, or masses. Cardiac: RRR, no murmurs, rubs, or gallops. No clubbing, cyanosis, edema.  Radials/DP/PT 2+ and equal bilaterally.  Respiratory:  Respirations regular and unlabored, clear to auscultation bilaterally. GI: Soft, nontender,  nondistended. MS: No deformity or atrophy. Skin: Warm and dry, no rash. Neuro:  Strength and sensation are intact. Psych: Normal affect.  Assessment & Plan    1. Syncope - Episode 05/2020 associated with stomach cramping shortly after eating. ZIO with predominantly NSR, rare PVC/PAC, 2 asymptomatic episodes of atrial tachycardia. Echo with normal LVEF, normal diastolic function, no significant valvular abnormalities. No recurrent syncope, no amaurosis fugax nor carotid bruit - no indication for carotid duplex at this time. Anticipate non cardiac etiology of syncope. Encouraged her to stay well hydrated, eat small regular meals to prevent any symptomatic hypotension. She has upcoming appt with GI for further evaluation.  2. Palpitations - reports an occasional flutter when laying down to sleep. ZIO with occasional PAC/PVC which is likely etiology. Discussed triggers of palpitations including dehydration, caffeine, alcohol. Does endorse not drinking much water and encouraged to stay well hydrated. Palpitations are overall infrequent and not bothersome, no further evaluation at this time.  Disposition: Follow up prn with Dr. Saunders Revel or APP    Signed, Loel Dubonnet, NP 08/06/2020, 7:20 PM [place]

## 2020-08-06 NOTE — Patient Instructions (Signed)
Medication Instructions:  No medication changes today.   *If you need a refill on your cardiac medications before your next appointment, please call your pharmacy*   Lab Work: None ordered today.   Testing/Procedures: Your echocardiogram showed normal heart pumping function and normal heart valves.  Your ZIO monitor showed mostly normal sinus rhythm.    Follow-Up: At [facility], you and your health needs are our priority.  As part of our continuing mission to provide you with exceptional heart care, we have created designated Provider Care Teams.  These Care Teams include your primary Cardiologist (physician) and Advanced Practice Providers (APPs -  Physician Assistants and Nurse Practitioners) who all work together to provide you with the care you need, when you need it.  We recommend signing up for the patient portal called "MyChart".  Sign up information is provided on this After Visit Summary.  MyChart is used to connect with patients for Virtual Visits (Telemedicine).  Patients are able to view lab/test results, encounter notes, upcoming appointments, etc.  Non-urgent messages can be sent to your provider as well.   To learn more about what you can do with MyChart, go to [email or web address].    Your next appointment:   As needed in person with Dr. Saunders Revel  Other Instructions  Try to increase your fluid intake. You can try drinking something with electrolytes such as Mio flavor or LiquidIV.

## 2020-08-12 ENCOUNTER — Other Ambulatory Visit: Payer: 59

## 2020-08-12 ENCOUNTER — Ambulatory Visit: Payer: 59 | Admitting: Gastroenterology

## 2020-08-12 ENCOUNTER — Encounter: Payer: Self-pay | Admitting: Gastroenterology

## 2020-08-12 VITALS — BP 106/62 | HR 80

## 2020-08-12 DIAGNOSIS — K529 Noninfective gastroenteritis and colitis, unspecified: Secondary | ICD-10-CM | POA: Diagnosis not present

## 2020-08-12 DIAGNOSIS — R1030 Lower abdominal pain, unspecified: Secondary | ICD-10-CM | POA: Diagnosis not present

## 2020-08-12 NOTE — Progress Notes (Signed)
[facility] Consult Note:  History: Stephanie Henderson 08/12/2020  Referring provider: Crecencio Mc, MD  Reason for consult/chief complaint: Diarrhea (Patient notes several episodes of loose stool and uneasy feeling after eating. Had 1 episode of syncope recently after eating. Cardiology causes ruled out. She also notes that this has been an issue for years where she has had some loose stool after eating. No blood. Mother with IBS, father with colon cancer (age 29). Patient seems to find that white breads, potatoes etc cause bloating and abdominal discomfort. ) and Gastroesophageal Reflux (X 10 years or so. Takes pantoprazole which seems to help.)   Subjective  HPI: From February 7 primary care office note: "Treated in ER at [facility] on jan 31 after having a witnessed syncopal event at home, which resulted in blunt head trauma . The witnessed  LOC lasted  a few seconds. Husband called EMS and she had a  2nd syncopal event occurring during EMS evaluation while trying to stand up .  orthostatic hypoetnsion reported .  BP was 70/30. She states that the episode occurring after eating dinner,  While still at the dinner table,  And was preceded by abdominal cramping that became intense.  .  Following transport to the ER ,  She had multiple episodes of loose stools during the 15 hour wait,  And continued dull abdominal pain for several days afterward,  But was not evaluated with CT abd during ER evaluation.  She had one episode of  BRBP rectum after the 5th epsiodes of loose stool, but none since .  She was released after ruling out for MI and advised to follow up with Dr Nehemiah Massed,  No referral done.    She reports infrequent episodes of post prandial abdominal pain and loose stools,  Occurring randomly but not more than every 5 or 6 months.   The episodes last less than 48 hours and she has not had an evaluation during one of the episodes.  No known food allergies,  But has tried  avoiding gluten .  Mother has IBS and father had colon CA    Reviewed EGD/colonoscopy by [facility] in march 2020 : small hiatal hernia,  Gastric polyps,  Tortuous colon. No mention of diverticulosis. CT abdomen and pelvis done in 2014 at [facility] for same was negative for GI pathology, noted a complex right ovarian cyst  Determined to be a hemorrhagic cyst whii chresolved on follow up."  From recent cardiology follow-up note: "As part of syncope workup she had echocardiogram 07/10/20 with normal LVEF, no RWMA, and no signfiicant valvular abnormalities. She wore a 14 day ZIO monitor with predominantly NSR, rare PAC/PVC (<1%) and 2 runs of atrial tachycardia which were asymptomatic.    She presents today for follow up. Reviewed cardiac testing in detail and she was reassured by result. No recurrent syncope. Has upcoming evaluation with GI and is hopeful for some answers as she wonders whether IBS and significant vasovagal reaction caused her previous syncopal episode. Does endorse occasional lightheadedness with quick position changes. Her SBP for many years has been <110. Does share that she has a significant caffeine intake and does not drink much water. 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  It is occurred over many years, happening perhaps every few months where she will get crampy lower abdominal discomfort and perhaps some nausea.  It might only last several minutes  followed by some loose stool either immediately or couple of hours later, or sometimes more protracted episodes.  The most recent episode was the most severe she can recall, especially since it caused syncope, leading her to hit her head and go to the ED. Sometimes she gets somewhat lightheaded when standing even without these GI symptoms.  Stephanie Henderson has also not noticed any clear consistent food triggers, other than perhaps white bread or potatoes  because bloating.  She is up-to-date on colon cancer screening as noted above. Lastly, when the episodes of cramps and diarrhea occur, she does not have lip swelling rash or hives occur.  ROS:  Review of Systems  Constitutional: Negative for appetite change and unexpected weight change.  HENT: Negative for mouth sores and voice change.   Eyes: Negative for pain and redness.  Respiratory: Negative for cough and shortness of breath.   Cardiovascular: Negative for chest pain and palpitations.  Genitourinary: Negative for dysuria and hematuria.  Musculoskeletal: Negative for arthralgias and myalgias.  Skin: Negative for pallor and rash.  Neurological: Negative for weakness and headaches.  Hematological: Negative for adenopathy.     Past Medical History: Past Medical History:  Diagnosis Date  . Anxiety   . Fibroid   . Gallstones   . GERD (gastroesophageal reflux disease) Aug 2012   with esophagitis by EGD  . Hematuria   . Hx of colonic polyps August 2012   repeat due 2016,  5 polyps 2009, clear 2012 Gustavo Lah)  . Menorrhagia    did not tolerate trial of ocps due to migraines  . Migraine headache   . Migraine syndrome    since age 36,  sporadic  . Skin cancer      Past Surgical History: Past Surgical History:  Procedure Laterality Date  . BREAST BIOPSY Right 2017   benign  . COLONOSCOPY WITH PROPOFOL N/A 07/31/2018   Procedure: COLONOSCOPY WITH PROPOFOL;  Surgeon: Lollie Sails, MD;  Location: [facility] ENDOSCOPY;  Service: Endoscopy;  Laterality: N/A;  . CYSTOSCOPY    . ESOPHAGOGASTRODUODENOSCOPY (EGD) WITH PROPOFOL N/A 07/31/2018   Procedure: ESOPHAGOGASTRODUODENOSCOPY (EGD) WITH PROPOFOL;  Surgeon: Lollie Sails, MD;  Location: [facility] ENDOSCOPY;  Service: Endoscopy;  Laterality: N/A;  . laparoscopy    . VAGINAL HYSTERECTOMY  6/13   with morcellation     Family History: Family History  Problem Relation Age of Onset  . Hyperlipidemia Mother   . Mental illness  Mother        depression  . Dementia Mother   . Colon polyps Mother   . Irritable bowel syndrome Mother   . Colon cancer Father 11  . Depression Father   . Mental illness Father   . Diabetes Neg Hx   . Heart disease Neg Hx   . Breast cancer Neg Hx   . Ovarian cancer Neg Hx   . Pancreatic cancer Neg Hx   . Stomach cancer Neg Hx   . Liver disease Neg Hx     Social History: Social History   Socioeconomic History  . Marital status: Married    Spouse name: Not on file  . Number of children: 2  . Years of education: Not on file  . Highest education level: Not on file  Occupational History  . 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 Smoking status: Never Smoker  . Smokeless tobacco: Never Used  Vaping Use  . Vaping Use: Never used  Substance and Sexual Activity  .  Alcohol use: Yes    Alcohol/week: 3.0 standard drinks    Types: 3 Glasses of wine per week  . Drug use: No  . Sexual activity: Yes    Birth control/protection: Surgical  Other Topics Concern  . Not on file  Social History Narrative  . Not on file   Social Determinants of Health   Financial Resource Strain: Not on file  Food Insecurity: Not on file  Transportation Needs: Not on file  Physical Activity: Not on file  Stress: Not on file  Social Connections: Not on file    Allergies: Allergies  Allergen Reactions  . Propoxyphene     Other reaction(s): Other (See Comments) Unknown  . Tetracyclines & Related Diarrhea and Nausea Only    Outpatient Meds: Current Outpatient Medications  Medication Sig Dispense Refill  . ALPRAZolam (XANAX) 0.5 MG tablet Take 1 tablet (0.5 mg total) by mouth at bedtime as needed for anxiety. 30 tablet 5  . cyclobenzaprine (FLEXERIL) 10 MG tablet Take 1 tablet (10 mg total) by mouth at bedtime. (Patient taking differently: Take 10 mg by mouth daily as needed.) 30 tablet 11  . famotidine (PEPCID) 40 MG tablet Take 1 tablet (40 mg total) by mouth daily.  (Patient taking differently: Take 40 mg by mouth daily as needed.) 90 tablet 1  . hyoscyamine (ANASPAZ) 0.125 MG TBDP disintergrating tablet Place 1 tablet (0.125 mg total) under the tongue every 6 (six) hours as needed. 30 tablet 0  . pantoprazole (PROTONIX) 40 MG tablet TAKE 1 TABLET (40 MG TOTAL) BY MOUTH DAILY. 90 tablet 2  . sucralfate (CARAFATE) 1 G tablet Take 1 g by mouth 3 (three) times daily as needed.    Marland Kitchen tiZANidine (ZANAFLEX) 4 MG tablet Take 1 tablet (4 mg total) by mouth every 6 (six) hours as needed for muscle spasms. 30 tablet 0  . SUMAtriptan (IMITREX) 100 MG tablet Take 1 tablet (100 mg total) by mouth once for 1 dose. May repeat in 2 hours if needed 10 tablet 5   No current facility-administered medications for this visit.      ___________________________________________________________________ Objective   Exam:  BP 106/62   Pulse 80   LMP 02/20/2011  Wt Readings from Last 3 Encounters:  08/06/20 113 lb (51.3 kg)  06/25/20 112 lb 8 oz (51 kg)  06/23/20 111 lb 3.2 oz (50.4 kg)   Petite and thin   General: Well-appearing  Eyes: sclera anicteric, no redness  ENT: oral mucosa moist without lesions, no cervical or supraclavicular lymphadenopathy  CV: RRR without murmur, S1/S2, no JVD, no peripheral edema  Resp: clear to auscultation bilaterally, normal RR and effort noted  GI: soft, no tenderness, with active bowel sounds. No guarding or palpable organomegaly noted.  Skin; warm and dry, no rash or jaundice noted  Neuro: awake, alert and oriented x 3. Normal gross motor function and fluent speech  Labs:  CBC Latest Ref Rng & Units 06/23/2020 06/15/2020 05/25/2018  WBC 4.0 - 10.5 K/uL 8.2 10.3 8.3  Hemoglobin 12.0 - 15.0 g/dL 13.2 12.2 12.7  Hematocrit 36.0 - 46.0 % 39.8 36.3 38.5  Platelets 150.0 - 400.0 K/uL 281.0 193 263.0   CMP Latest Ref Rng & Units 06/23/2020 06/15/2020 05/25/2018  Glucose 70 - 99 mg/dL 91 145(H) 83  BUN 6 - 23 mg/dL 11 13 10    Creatinine 0.40 - 1.20 mg/dL 0.69 0.58 0.55  Sodium 135 - 145 mEq/L 137 140 137  Potassium 3.5 - 5.1 mEq/L 4.2 3.6  4.1  Chloride 96 - 112 mEq/L 99 102 102  CO2 19 - 32 mEq/L 33(H) 27 29  Calcium 8.4 - 10.5 mg/dL 10.1 9.3 9.4  Total Protein 6.0 - 8.3 g/dL - - 6.5  Total Bilirubin 0.2 - 1.2 mg/dL - - 0.4  Alkaline Phos 39 - 117 U/L - - 33(L)  AST 0 - 37 U/L - - 32  ALT 0 - 35 U/L - - 43(H)     Radiologic Studies:  July 2014 CT abdomen and pelvis report from [facility] was reviewed and is on file in the EMR.  Ovarian cyst  Other:  Endoscopic biopsy results from 07/31/2018  A.  STOMACH, ANTRUM AND BODY; COLD BIOPSY:  - MILD REACTIVE GASTROPATHY.  - NEGATIVE FOR ACTIVE INFLAMMATION, H. PYLORI, INTESTINAL METAPLASIA,  DYSPLASIA, AND MALIGNANCY.   B.  STOMACH POLYPS; COLD BIOPSY:  - FUNDIC GLAND POLYPS, INFLAMED, 2 FRAGMENTS.  - NEGATIVE FOR DYSPLASIA AND MALIGNANCY.   C.  GASTROESOPHAGEAL JUNCTION; COLD BIOPSY:  - SQUAMOCOLUMNAR MUCOSA WITH MODERATE CHRONIC ACTIVE INFLAMMATION,  CONSISTENT WITH REFLUX ESOPHAGITIS.  - NEGATIVE FOR GOBLET CELLS, DYSPLASIA, AND MALIGNANCY.   D.  COLON POLYP, HEPATIC FLEXURE; COLD BIOPSY:  - TUBULAR ADENOMA.  - NEGATIVE FOR HIGH-GRADE DYSPLASIA AND MALIGNANCY.    Assessment: Encounter Diagnoses  Name Primary?  . Chronic diarrhea Yes  . Lower abdominal pain     Years of episodic crampy mid to lower abdominal pain followed by diarrhea.  I believe she has IBS and heightened vagal tone leading to presyncope or syncope.  She does not have associated symptoms to indicate an allergic disorder or angioedema. Endoscopic work-up has been reassuring, though no duodenal biopsies taken to rule out sprue.  The episodic nature of it seems to speak against that somewhat.  Plan:  Primary care prescribed hyoscyamine at the most recent visit after the ED episode, but she has not yet had occasion to take it because she has felt well, as she typically does for months  between episodes.  She has a medicine on hand and plans to take it when the next episode occurs.  TTG antibody and total IgA level to screen for gluten intolerance.  Some additional written dietary advice was given as well.  I will be glad to see her as needed.  Thank you for the courtesy of this consult.  Please call me with any questions or concerns.  Nelida Meuse III  CC: Referring provider noted above

## 2020-08-12 NOTE — Patient Instructions (Signed)
_______________________________________________________ Food Guidelines for those with chronic digestive trouble:  Many people have difficulty digesting certain foods, causing a variety of distressing and embarrassing symptoms such as abdominal pain, bloating and gas.  These foods may need to be avoided or consumed in small amounts.  Here are some tips that might be helpful for you.  1.   Lactose intolerance is the difficulty or complete inability to digest lactose, the natural sugar in milk and anything made from milk.  This condition is harmless, common, and can begin any time during life.  Some people can digest a modest amount of lactose while others cannot tolerate any.  Also, not all dairy products contain equal amounts of lactose.  For example, hard cheeses such as parmesan have less lactose than soft cheeses such as cheddar.  Yogurt has less lactose than milk or cheese.  Many packaged foods (even many brands of bread) have milk, so read ingredient lists carefully.  It is difficult to test for lactose intolerance, so just try avoiding lactose as much as possible for a week and see what happens with your symptoms.  If you seem to be lactose intolerant, the best plan is to avoid it (but make sure you get calcium from another source).  The next best thing is to use lactase enzyme supplements, available over the counter everywhere.  Just know that many lactose intolerant people need to take several tablets with each serving of dairy to avoid symptoms.  Lastly, a lot of restaurant food is made with milk or butter.  Many are things you might not suspect, such as mashed potatoes, rice and pasta (cooked with butter) and "grilled" items.  If you are lactose intolerant, it never hurts to ask your server what has milk or butter.  2.   Fiber is an important part of your diet, but not all fiber is well-tolerated.  Insoluble fiber such as bran is often consumed by normal gut bacteria and converted into gas.  Soluble  fiber such as oats, squash, carrots and green beans are typically tolerated better.  3.   Some types of carbohydrates can be poorly digested.  Examples include: fructose (apples, cherries, pears, raisins and other dried fruits), fructans (onions, zucchini, large amounts of wheat), sorbitol/mannitol/xylitol and sucralose/Splenda (common artificial sweeteners), and raffinose (lentils, broccoli, cabbage, asparagus, brussel sprouts, many types of beans).  Do a web search for FODMAP diet and you will find helpful information. Beano, a dietary supplement, will often help with raffinose-containing foods.  As with lactase tablets, you may need several per serving.  4.   Whenever possible, avoid processed food&meats and chemical additives.  High fructose corn syrup, a common sweetener, may be difficult to digest.  Eggs and soy (comes from the soybean, and added to many foods now) are other common bloating/gassy foods.  5.  Regarding gluten:  gluten is a protein mainly found in wheat, but also rye and barley.  There is a condition called celiac sprue, which is an inflammatory reaction in the small intestine causing a variety of digestive symptoms.  Blood testing is highly reliable to look for this condition, and sometimes upper endoscopy with small bowel biopsies may be necessary to make the diagnosis.  Many patients who test negative for celiac sprue report improvement in their digestive symptoms when they switch to a gluten-free diet.  However, in these "non-celiac gluten sensitive" patients, the true role of gluten in their symptoms is unclear.  Reducing carbohydrates in general may decrease the gas and   bloating caused when gut bacteria consume carbs. Also, some of these patients may actually be intolerant of the baker's yeast in bread products rather than the gluten.  Flatbread and other reduced yeast breads might therefore be tolerated.  There is no specific testing available for most food intolerances, which are  discovered mainly by dietary elimination.  Please do not embark on a gluten free diet unless directed by your doctor, as it is highly restrictive, and may lead to nutritional deficiencies if not carefully monitored.  Lastly, beware of internet claims offering "personalized" tests for food intolerances.  Such testing has no reliable scientific evidence to support its reliability and correlation to symptoms.    6.  The best advice is old advice, especially for those with chronic digestive trouble - try to eat "clean".  Balanced diet, avoid processed food, plenty of fruits and vegetables, cut down the sugar, minimal alcohol, avoid tobacco. Make time to care for yourself, get enough sleep, exercise when you can, reduce stress.  Your guts will thank you for it.   - Dr. Rickeya Manus Danis [facility]  ____________________________________________________________     

## 2020-08-13 LAB — IGA: Immunoglobulin A: 77 mg/dL (ref 47–310)

## 2020-08-13 LAB — TISSUE TRANSGLUTAMINASE, IGA: (tTG) Ab, IgA: <1 U/mL

## 2020-08-19 ENCOUNTER — Ambulatory Visit (INDEPENDENT_AMBULATORY_CARE_PROVIDER_SITE_OTHER): Payer: 59 | Admitting: Internal Medicine

## 2020-08-19 ENCOUNTER — Other Ambulatory Visit: Payer: Self-pay

## 2020-08-19 ENCOUNTER — Encounter: Payer: Self-pay | Admitting: Internal Medicine

## 2020-08-19 VITALS — BP 100/60 | HR 80 | Temp 98.1°F | Resp 14 | Ht 62.0 in | Wt 112.0 lb

## 2020-08-19 DIAGNOSIS — E559 Vitamin D deficiency, unspecified: Secondary | ICD-10-CM

## 2020-08-19 DIAGNOSIS — Z1231 Encounter for screening mammogram for malignant neoplasm of breast: Secondary | ICD-10-CM

## 2020-08-19 DIAGNOSIS — Z87898 Personal history of other specified conditions: Secondary | ICD-10-CM | POA: Diagnosis not present

## 2020-08-19 DIAGNOSIS — E538 Deficiency of other specified B group vitamins: Secondary | ICD-10-CM | POA: Diagnosis not present

## 2020-08-19 DIAGNOSIS — Z113 Encounter for screening for infections with a predominantly sexual mode of transmission: Secondary | ICD-10-CM

## 2020-08-19 DIAGNOSIS — E782 Mixed hyperlipidemia: Secondary | ICD-10-CM

## 2020-08-19 DIAGNOSIS — R1030 Lower abdominal pain, unspecified: Secondary | ICD-10-CM

## 2020-08-19 DIAGNOSIS — Z0000 Encounter for general adult medical examination without abnormal findings: Secondary | ICD-10-CM | POA: Diagnosis not present

## 2020-08-19 DIAGNOSIS — K2100 Gastro-esophageal reflux disease with esophagitis, without bleeding: Secondary | ICD-10-CM | POA: Diagnosis not present

## 2020-08-19 MED ORDER — SUCRALFATE 1 G PO TABS
1.0000 g | ORAL_TABLET | Freq: Three times a day (TID) | ORAL | 1 refills | Status: DC | PRN
  Filled 2020-08-19: qty 90, 30d supply, fill #0

## 2020-08-19 MED ORDER — PANTOPRAZOLE SODIUM 40 MG PO TBEC
40.0000 mg | DELAYED_RELEASE_TABLET | Freq: Every day | ORAL | 2 refills | Status: DC
  Filled 2020-08-19: qty 90, 90d supply, fill #0
  Filled 2020-12-03: qty 90, 90d supply, fill #1
  Filled 2021-03-22: qty 90, 90d supply, fill #2

## 2020-08-19 MED ORDER — ZOSTER VAC RECOMB ADJUVANTED 50 MCG/0.5ML IM SUSR: 0.5000 mL | Freq: Once | INTRAMUSCULAR | 1 refills | Status: AC

## 2020-08-19 MED ORDER — CYCLOBENZAPRINE HCL 10 MG PO TABS
10.0000 mg | ORAL_TABLET | Freq: Every day | ORAL | 1 refills | Status: DC
  Filled 2020-08-19: qty 90, 90d supply, fill #0
  Filled 2021-07-24: qty 90, 90d supply, fill #1

## 2020-08-19 NOTE — Patient Instructions (Signed)

## 2020-08-19 NOTE — Progress Notes (Signed)
Patient ID: Reata Petrov, female    DOB: 1965/09/22  Age: 55 y.o. MRN: [id number]  The patient is here for her annual preventive  examination and management of other chronic and acute problems.   The risk factors are reflected in the social history.  The roster of all physicians providing medical care to patient - is listed in the Snapshot section of the chart.  Activities of daily living:  The patient is 100% independent in all ADLs: dressing, toileting, feeding as well as independent mobility  Home safety : The patient has smoke detectors in the home. They wear seatbelts.  There are no firearms at home. There is no violence in the home.   There is no risks for hepatitis, STDs or HIV. There is no   history of blood transfusion. They have no travel history to infectious disease endemic areas of the world.  The patient has seen their dentist in the last six month. They have seen their eye doctor in the last year. They admit to slight hearing difficulty with regard to whispered voices and some television programs.  They have deferred audiologic testing in the last year.  They do not  have excessive sun exposure. Discussed the need for sun protection: hats, long sleeves and use of sunscreen if there is significant sun exposure.   Diet: the importance of a healthy diet is discussed. They do have a healthy diet. Lab Results  Component Value Date   HGBA1C 5.4 11/16/2018    The benefits of regular aerobic exercise were discussed. She walks 5 times per week ,  60 minutes.   Depression screen: there are no signs or vegative symptoms of depression- irritability, change in appetite, anhedonia, sadness/tearfullness.  The following portions of the patient's history were reviewed and updated as appropriate: allergies, current medications, past family history, past medical history,  past surgical history, past social history  and problem list.  Visual acuity was not assessed per patient preference  since she has regular follow up with her ophthalmologist. Hearing and body mass index were assessed and reviewed.   During the course of the visit the patient was educated and counseled about appropriate screening and preventive services including : fall prevention , diabetes screening, nutrition counseling, colorectal cancer screening, and recommended immunizations.    CC: The primary encounter diagnosis was B12 deficiency. Diagnoses of Vitamin D deficiency, Hyperlipidemia, mixed, Screen for STD (sexually transmitted disease), Breast cancer screening by mammogram, Encounter for preventive health examination, History of syncope, Lower abdominal pain, and Gastroesophageal reflux disease with esophagitis without hemorrhage were also pertinent to this visit.  1) reviewed recent cardiac workup for postiprandial syncope.  (all negative) 2) reviewed recent labs   History Mayla has a past medical history of Anxiety, Fibroid, Gallstones, GERD (gastroesophageal reflux disease) (Aug 2012), Hematuria, colonic polyps (August 2012), Menorrhagia, Migraine headache, Migraine syndrome, and Skin cancer.   She has a past surgical history that includes laparoscopy; Cystoscopy; Vaginal hysterectomy (6/13); Breast biopsy (Right, 2017); Colonoscopy with propofol (N/A, 07/31/2018); and Esophagogastroduodenoscopy (egd) with propofol (N/A, 07/31/2018).   Her family history includes Colon cancer (age of onset: 40) in her father; Colon polyps in her mother; Dementia in her mother; Depression in her father; Hyperlipidemia in her mother; Irritable bowel syndrome in her mother; Mental illness in her father and mother.She reports that she has never smoked. She has never used smokeless tobacco. She reports current alcohol use of about 3.0 standard drinks of alcohol per week. She reports that she  does not use drugs.  Outpatient Medications Prior to Visit  Medication Sig Dispense Refill  . ALPRAZolam (XANAX) 0.5 MG tablet TAKE 1  TABLET BY MOUTH AT BEDTIME AS NEEDED FOR ANXIETY. 30 tablet 5  . hyoscyamine (ANASPAZ) 0.125 MG TBDP disintergrating tablet PLACE 1 TABLET UNDER THE TONGUE EVERY 6 HOURS AS NEEDED. 30 tablet 0  . SUMAtriptan (IMITREX) 100 MG tablet Take 1 tablet (100 mg total) by mouth once for 1 dose. May repeat in 2 hours if needed 10 tablet 5  . cyclobenzaprine (FLEXERIL) 10 MG tablet Take 1 tablet (10 mg total) by mouth at bedtime. (Patient taking differently: Take 10 mg by mouth daily as needed.) 30 tablet 11  . famotidine (PEPCID) 40 MG tablet Take 1 tablet (40 mg total) by mouth daily. (Patient not taking: Reported on 08/19/2020) 90 tablet 1  . fluconazole (DIFLUCAN) 150 MG tablet TAKE 1 TABLET BY MOUTH ONCE FOR 1 DOSE. DOSE IN 3 DAYS IF NEEDED (Patient not taking: Reported on 08/19/2020) 2 tablet 0  . pantoprazole (PROTONIX) 40 MG tablet TAKE 1 TABLET (40 MG TOTAL) BY MOUTH DAILY. 90 tablet 2  . sucralfate (CARAFATE) 1 G tablet Take 1 g by mouth 3 (three) times daily as needed.    Marland Kitchen tiZANidine (ZANAFLEX) 4 MG tablet Take 1 tablet (4 mg total) by mouth every 6 (six) hours as needed for muscle spasms. 30 tablet 0   No facility-administered medications prior to visit.    Review of Systems   Patient denies headache, fevers, malaise, unintentional weight loss, skin rash, eye pain, sinus congestion and sinus pain, sore throat, dysphagia,  hemoptysis , cough, dyspnea, wheezing, chest pain, palpitations, orthopnea, edema, abdominal pain, nausea, melena, diarrhea, constipation, flank pain, dysuria, hematuria, urinary  Frequency, nocturia, numbness, tingling, seizures,  Focal weakness, Loss of consciousness,  Tremor, insomnia, depression, anxiety, and suicidal ideation.     Objective:  BP 100/60 (BP Location: Left Arm, Patient Position: Sitting, Cuff Size: Normal)   Pulse 80   Temp 98.1 F (36.7 C) (Oral)   Resp 14   Ht 5\' 2"  (1.575 m)   Wt 112 lb (50.8 kg)   LMP 02/20/2011   SpO2 98%   BMI 20.49 kg/m    Physical Exam  General appearance: alert, cooperative and appears stated age Head: Normocephalic, without obvious abnormality, atraumatic Eyes: conjunctivae/corneas clear. PERRL, EOM's intact. Fundi benign. Ears: normal TM's and external ear canals both ears Nose: Nares normal. Septum midline. Mucosa normal. No drainage or sinus tenderness. Throat: lips, mucosa, and tongue normal; teeth and gums normal Neck: no adenopathy, no carotid bruit, no JVD, supple, symmetrical, trachea midline and thyroid not enlarged, symmetric, no tenderness/mass/nodules Lungs: clear to auscultation bilaterally Breasts: normal appearance, no masses or tenderness Heart: regular rate and rhythm, S1, S2 normal, no murmur, click, rub or gallop Abdomen: soft, non-tender; bowel sounds normal; no masses,  no organomegaly Extremities: extremities normal, atraumatic, no cyanosis or edema Pulses: 2+ and symmetric Skin: Skin color, texture, turgor normal. No rashes or lesions Neurologic: Alert and oriented X 3, normal strength and tone. Normal symmetric reflexes. Normal coordination and gait.    Assessment & Plan:   Problem List Items Addressed This Visit      Unprioritized   Abdominal pain    Intermittent,  With pain and loose stools occurring infrequently , lasting up to 48 hours.  IBS diagnosed by GI.  Continue prn sucralfate and hyoscyamine.       B12 deficiency - Primary  Encounter for preventive health examination    age appropriate education and counseling updated, referrals for preventative services and immunizations addressed, dietary and smoking counseling addressed, most recent labs reviewed.  I have personally reviewed and have noted:  1) the patient's medical and social history 2) The pt's use of alcohol, tobacco, and illicit drugs 3) The patient's current medications and supplements 4) Functional ability including ADL's, fall risk, home safety risk, hearing and visual impairment 5) Diet and  physical activities 6) Evidence for depression or mood disorder 7) The patient's height, weight, and BMI have been recorded in the chart  I have made referrals, and provided counseling and education based on review of the above      History of syncope    She was referred to cardiology for rule out arrhtymia and outflow tract obstruction.  Normal evaluation       Reflux esophagitis    Continue daily PPI       Other Visit Diagnoses    Vitamin D deficiency       Relevant Orders   VITAMIN D 25 Hydroxy (Vit-D Deficiency, Fractures) (Completed)   Hyperlipidemia, mixed       Relevant Orders   Comprehensive metabolic panel (Completed)   Lipid panel (Completed)   TSH (Completed)   Screen for STD (sexually transmitted disease)       Relevant Orders   Hepatitis C antibody (Completed)   HIV Antibody (routine testing w rflx) (Completed)   Breast cancer screening by mammogram       Relevant Orders   MM 3D SCREEN BREAST BILATERAL      I have discontinued Lattie Haw T. Blando's famotidine, tiZANidine, and fluconazole. I have also changed her sucralfate. Additionally, I am having her start on Zoster Vaccine Adjuvanted. Lastly, I am having her maintain her SUMAtriptan, ALPRAZolam, hyoscyamine, cyclobenzaprine, and pantoprazole.  Meds ordered this encounter  Medications  . cyclobenzaprine (FLEXERIL) 10 MG tablet    Sig: Take 1 tablet (10 mg total) by mouth at bedtime.    Dispense:  90 tablet    Refill:  1  . pantoprazole (PROTONIX) 40 MG tablet    Sig: Take 1 tablet (40 mg total) by mouth daily.    Dispense:  90 tablet    Refill:  2  . sucralfate (CARAFATE) 1 g tablet    Sig: Take 1 tablet (1 g total) by mouth 3 (three) times daily as needed.    Dispense:  90 tablet    Refill:  1  . Zoster Vaccine Adjuvanted [facility]) injection    Sig: Inject 0.5 mLs into the muscle once for 1 dose.    Dispense:  1 each    Refill:  1    Medications Discontinued During This Encounter  Medication  Reason  . fluconazole (DIFLUCAN) 150 MG tablet   . famotidine (PEPCID) 40 MG tablet   . sucralfate (CARAFATE) 1 G tablet Reorder  . cyclobenzaprine (FLEXERIL) 10 MG tablet Reorder  . pantoprazole (PROTONIX) 40 MG tablet Reorder  . tiZANidine (ZANAFLEX) 4 MG tablet     Follow-up: No follow-ups on file.   Crecencio Mc, MD

## 2020-08-20 LAB — COMPREHENSIVE METABOLIC PANEL
ALT: 22 U/L (ref 0–35)
AST: 22 U/L (ref 0–37)
Albumin: 4.7 g/dL (ref 3.5–5.2)
Alkaline Phosphatase: 45 U/L (ref 39–117)
BUN: 14 mg/dL (ref 6–23)
CO2: 31 mEq/L (ref 19–32)
Calcium: 9.9 mg/dL (ref 8.4–10.5)
Chloride: 100 mEq/L (ref 96–112)
Creatinine, Ser: 0.6 mg/dL (ref 0.40–1.20)
GFR: 102.69 mL/min (ref >60.00)
Glucose, Bld: 85 mg/dL (ref 70–99)
Potassium: 3.7 mEq/L (ref 3.5–5.1)
Sodium: 138 mEq/L (ref 135–145)
Total Bilirubin: 0.3 mg/dL (ref 0.2–1.2)
Total Protein: 6.7 g/dL (ref 6.0–8.3)

## 2020-08-20 LAB — LIPID PANEL
Cholesterol: 212 mg/dL — ABNORMAL HIGH (ref 0–200)
HDL: 64.8 mg/dL (ref >39.00)
LDL Cholesterol: 130 mg/dL — ABNORMAL HIGH (ref 0–99)
NonHDL: 147.59
Total CHOL/HDL Ratio: 3
Triglycerides: 88 mg/dL (ref 0.0–149.0)
VLDL: 17.6 mg/dL (ref 0.0–40.0)

## 2020-08-20 LAB — HEPATITIS C ANTIBODY
Hepatitis C Ab: NONREACTIVE
SIGNAL TO CUT-OFF: 0 (ref <1.00)

## 2020-08-20 LAB — HIV ANTIBODY (ROUTINE TESTING W REFLEX): HIV 1&2 Ab, 4th Generation: NONREACTIVE

## 2020-08-20 LAB — TSH: TSH: 2.09 u[IU]/mL (ref 0.35–4.50)

## 2020-08-20 LAB — VITAMIN D 25 HYDROXY (VIT D DEFICIENCY, FRACTURES): VITD: 28.68 ng/mL — ABNORMAL LOW (ref 30.00–100.00)

## 2020-08-20 NOTE — Assessment & Plan Note (Addendum)
She was referred to cardiology for rule out arrhtymia and outflow tract obstruction.  Normal evaluation

## 2020-08-20 NOTE — Assessment & Plan Note (Signed)
Intermittent,  With pain and loose stools occurring infrequently , lasting up to 48 hours.  IBS diagnosed by GI.  Continue prn sucralfate and hyoscyamine.

## 2020-08-20 NOTE — Assessment & Plan Note (Signed)
Continue daily PPI.  

## 2020-08-20 NOTE — Assessment & Plan Note (Signed)

## 2020-08-22 ENCOUNTER — Other Ambulatory Visit: Payer: Self-pay

## 2020-09-01 ENCOUNTER — Other Ambulatory Visit: Payer: Self-pay

## 2020-09-01 ENCOUNTER — Telehealth: Payer: 59 | Admitting: Nurse Practitioner

## 2020-09-01 DIAGNOSIS — J0101 Acute recurrent maxillary sinusitis: Secondary | ICD-10-CM

## 2020-09-01 MED ORDER — LEVOFLOXACIN 500 MG PO TABS
500.0000 mg | ORAL_TABLET | Freq: Every day | ORAL | 0 refills | Status: DC
  Filled 2020-09-01: qty 7, 7d supply, fill #0

## 2020-09-01 NOTE — Progress Notes (Signed)
We are sorry that you are not feeling well.  Here is how we plan to help!  Based on what you have shared with me it looks like you have sinusitis.  Sinusitis is inflammation and infection in the sinus cavities of the head.  Based on your presentation I believe you most likely have Acute Bacterial Sinusitis.  This is an infection caused by bacteria and is treated with antibiotics. I have prescribed Levofloxicin 500mg  by mouth once daily for 7 days. You may use an oral decongestant such as Mucinex D or if you have glaucoma or high blood pressure use plain Mucinex. Saline nasal spray help and can safely be used as often as needed for congestion.  If you develop worsening sinus pain, fever or notice severe headache and vision changes, or if symptoms are not better after completion of antibiotic, please schedule an appointment with a health care provider.    Sinus infections are not as easily transmitted as other respiratory infection, however we still recommend that you avoid close contact with loved ones, especially the very young and elderly.  Remember to wash your hands thoroughly throughout the day as this is the number one way to prevent the spread of infection!  Home Care:  Only take medications as instructed by your medical team.  Complete the entire course of an antibiotic.  Do not take these medications with alcohol.  A steam or ultrasonic humidifier can help congestion.  You can place a towel over your head and breathe in the steam from hot water coming from a faucet.  Avoid close contacts especially the very young and the elderly.  Cover your mouth when you cough or sneeze.  Always remember to wash your hands.  Get Help Right Away If:  You develop worsening fever or sinus pain.  You develop a severe head ache or visual changes.  Your symptoms persist after you have completed your treatment plan.  Make sure you  Understand these instructions.  Will watch your  condition.  Will get help right away if you are not doing well or get worse.  Your e-visit answers were reviewed by a board certified advanced clinical practitioner to complete your personal care plan.  Depending on the condition, your plan could have included both over the counter or prescription medications.  If there is a problem please reply  once you have received a response from your provider.  Your safety is important to [place].  If you have drug allergies check your prescription carefully.    You can use MyChart to ask questions about today's visit, request a non-urgent call back, or ask for a work or school excuse for 24 hours related to this e-Visit. If it has been greater than 24 hours you will need to follow up with your provider, or enter a new e-Visit to address those concerns.  You will get an e-mail in the next two days asking about your experience.  I hope that your e-visit has been valuable and will speed your recovery. Thank you for using e-visits. 5-10 minutes spent reviewing and documenting in chart.

## 2020-09-26 DIAGNOSIS — Z23 Encounter for immunization: Secondary | ICD-10-CM | POA: Diagnosis not present

## 2020-11-03 ENCOUNTER — Ambulatory Visit: Payer: 59

## 2020-11-10 ENCOUNTER — Other Ambulatory Visit: Payer: Self-pay

## 2020-11-10 MED FILL — Alprazolam Tab 0.5 MG: ORAL | 30 days supply | Qty: 30 | Fill #0 | Status: AC

## 2020-11-11 ENCOUNTER — Other Ambulatory Visit: Payer: Self-pay

## 2020-11-11 MED ORDER — CARESTART COVID-19 HOME TEST VI KIT
PACK | 0 refills | Status: DC
  Filled 2020-11-11: qty 2, 4d supply, fill #0

## 2020-12-03 ENCOUNTER — Other Ambulatory Visit: Payer: Self-pay

## 2020-12-11 DIAGNOSIS — D225 Melanocytic nevi of trunk: Secondary | ICD-10-CM | POA: Diagnosis not present

## 2020-12-11 DIAGNOSIS — D2271 Melanocytic nevi of right lower limb, including hip: Secondary | ICD-10-CM | POA: Diagnosis not present

## 2020-12-11 DIAGNOSIS — D2262 Melanocytic nevi of left upper limb, including shoulder: Secondary | ICD-10-CM | POA: Diagnosis not present

## 2020-12-11 DIAGNOSIS — D2261 Melanocytic nevi of right upper limb, including shoulder: Secondary | ICD-10-CM | POA: Diagnosis not present

## 2020-12-11 DIAGNOSIS — L821 Other seborrheic keratosis: Secondary | ICD-10-CM | POA: Diagnosis not present

## 2020-12-11 DIAGNOSIS — Z85828 Personal history of other malignant neoplasm of skin: Secondary | ICD-10-CM | POA: Diagnosis not present

## 2020-12-20 MED FILL — Alprazolam Tab 0.5 MG: ORAL | 30 days supply | Qty: 30 | Fill #1 | Status: CN

## 2020-12-22 ENCOUNTER — Other Ambulatory Visit: Payer: Self-pay

## 2020-12-24 ENCOUNTER — Ambulatory Visit
Admission: RE | Admit: 2020-12-24 | Discharge: 2020-12-24 | Disposition: A | Discharge status: Home / Self Care | Payer: 59 | Source: Ambulatory Visit | Attending: Internal Medicine | Admitting: Internal Medicine

## 2020-12-24 ENCOUNTER — Other Ambulatory Visit: Payer: Self-pay

## 2020-12-24 DIAGNOSIS — Z1231 Encounter for screening mammogram for malignant neoplasm of breast: Secondary | ICD-10-CM

## 2021-01-21 ENCOUNTER — Ambulatory Visit: Payer: Self-pay

## 2021-01-21 ENCOUNTER — Ambulatory Visit: Payer: 59 | Admitting: Family Medicine

## 2021-01-21 ENCOUNTER — Other Ambulatory Visit: Payer: Self-pay

## 2021-01-21 VITALS — Ht 62.0 in | Wt 112.0 lb

## 2021-01-21 DIAGNOSIS — M25512 Pain in left shoulder: Secondary | ICD-10-CM

## 2021-01-21 MED ORDER — METHYLPREDNISOLONE ACETATE 40 MG/ML IJ SUSP
40.0000 mg | Freq: Once | INTRAMUSCULAR | Status: AC
  Administered 2021-01-21: 40 mg via INTRA_ARTICULAR

## 2021-01-21 NOTE — Progress Notes (Signed)
PCP: Crecencio Mc, MD  Subjective:   HPI: Patient is a 55 y.o. female here for left shoulder pain.  Seen today for evaluation of left shoulder pain present for the past 3 months.  Denies inciting event or trauma at that time.  No history of prior injury to the left shoulder, but does have a history of adhesive capsulitis of the right shoulder 3 years ago that took 14 months to recover though current pain worse than when she had this in right shoulder. Pain is most noticeable at night.  She states that she is unable to find a comfortable position, even endorsing discomfort with sleeping on her right side because there is discomfort with draping her left arm across her body. She has appreciated a diminished range of motion in her left shoulder, stating that she is unable to reach above her head with her left arm and is unable to reach behind her to undo her bra strap. She is also noted some pain radiating to her left elbow.  Ms. Soule has tried performing PT exercises at home that she saved from when she previously had adhesive capsulitis of the right shoulder.  Despite routinely performing these exercises her pain has persisted. She is currently using ibuprofen, Voltaren gel and alternating heat/ice as needed for pain with minimal relief.   Past Medical History:  Diagnosis Date   Anxiety    Fibroid    Gallstones    GERD (gastroesophageal reflux disease) Aug 2012   with esophagitis by EGD   Hematuria    Hx of colonic polyps August 2012   repeat due 2016,  5 polyps 2009, clear 2012 ([place])   Menorrhagia    did not tolerate trial of ocps due to migraines   Migraine headache    Migraine syndrome    since age 42,  sporadic   Skin cancer     Current Outpatient Medications on File Prior to Visit  Medication Sig Dispense Refill   COVID-19 At Home Antigen Test (CARESTART COVID-19 HOME TEST) KIT use as directed within package instructions 2 kit 0   cyclobenzaprine (FLEXERIL) 10 MG  tablet Take 1 tablet (10 mg total) by mouth at bedtime. 90 tablet 1   hyoscyamine (ANASPAZ) 0.125 MG TBDP disintergrating tablet PLACE 1 TABLET UNDER THE TONGUE EVERY 6 HOURS AS NEEDED. 30 tablet 0   levofloxacin (LEVAQUIN) 500 MG tablet Take 1 tablet (500 mg total) by mouth daily. 7 tablet 0   pantoprazole (PROTONIX) 40 MG tablet Take 1 tablet (40 mg total) by mouth daily. 90 tablet 2   sucralfate (CARAFATE) 1 g tablet Take 1 tablet (1 g total) by mouth 3 (three) times daily as needed. 90 tablet 1   SUMAtriptan (IMITREX) 100 MG tablet Take 1 tablet (100 mg total) by mouth once for 1 dose. May repeat in 2 hours if needed 10 tablet 5   No current facility-administered medications on file prior to visit.    Past Surgical History:  Procedure Laterality Date   BREAST BIOPSY Right 2017   benign   COLONOSCOPY WITH PROPOFOL N/A 07/31/2018   Procedure: COLONOSCOPY WITH PROPOFOL;  Surgeon: Lollie Sails, MD;  Location: [facility] ENDOSCOPY;  Service: Endoscopy;  Laterality: N/A;   CYSTOSCOPY     ESOPHAGOGASTRODUODENOSCOPY (EGD) WITH PROPOFOL N/A 07/31/2018   Procedure: ESOPHAGOGASTRODUODENOSCOPY (EGD) WITH PROPOFOL;  Surgeon: Lollie Sails, MD;  [facility] ENDOSCOPY;  Service: Endoscopy;  Laterality: N/A;   laparoscopy     VAGINAL HYSTERECTOMY  6/13  with morcellation    Allergies  Allergen Reactions   Propoxyphene     Other reaction(s): Other (See Comments) Unknown   Tetracyclines & Related Diarrhea and Nausea Only    Ht 5' 2" (1.575 m)   Wt 112 lb (50.8 kg)   LMP 02/20/2011   BMI 20.49 kg/m   No flowsheet data found.  No flowsheet data found.      Objective:  Physical Exam:  Gen: NAD, comfortable in exam room  Left Shoulder No swelling, ecchymoses.  No gross deformity. No TTP. ROM: Unable to abduct beyond 90 degrees, ER limited to 35 degrees even passively (compared to 90 degrees in R shoulder), IR limited 2/2 pain Positive Empty Can, Hawkins, Speeds, and cross  body adduction. Strength 5/5 empty can, IR, ER. Negative apprehension. NV intact distally.   Left Elbow No TTP FROM  Complete MSK u/s left shoulder: Biceps tendon: intact without tenosynovitis Pec major tendon: intact Subscapularis: intact without tears, impingement AC joint: mild effusion and arthropathy Infraspinatus: intact without tear Supraspinatus: intact with mild overlying bursitis Posterior glenohumeral joint: questionable small effusion.  No labral abnormality or paralabral cyst  Impression: Overall reassuring, mild AC arthropathy, subacromial bursitis.  Assessment & Plan:  1.  Left adhesive capsulitis (frozen shoulder) History, exam, and ultrasound findings today are most consistent with a frozen shoulder.  Treatment options were reviewed.  She was agreeable with left glenohumeral joint steroid injection today given her discomfort while sleeping.  She will continue home PT exercises for now but will call us if she is not improving at which point we can arrange for formal PT.  She can continue to use Voltaren gel, ibuprofen and heating pads as needed. We will plan for follow-up in one month.  After informed written consent timeout was performed, patient was seated in chair in exam room. Left shoulder was prepped with alcohol swab and utilizing ultrasound guidance, patient's left glenohumeral space was injected with 3:1 lidocaine: depomedrol. Patient tolerated the procedure well without immediate complications.  Marland Kitchen, MD PGY-3  Patient seen and examined with resident.  Agree with his note and findings. Performed ultrasound guided injection above.

## 2021-01-21 NOTE — Patient Instructions (Signed)
As we discussed, you have a frozen shoulder.  A steroid injection you received today should help you get some sleep at night. -Continue the home PT exercises you have been performing -You can continue to use Voltaren gel and alternating heat/ice -We will have you follow-up in 1 month.  If your pain is getting worse, give [place] a call and we can get you back in with formal physical therapy. -Go [place] (and [place]!)

## 2021-02-23 ENCOUNTER — Ambulatory Visit (INDEPENDENT_AMBULATORY_CARE_PROVIDER_SITE_OTHER): Payer: 59 | Admitting: Family Medicine

## 2021-02-23 VITALS — Ht 62.0 in | Wt 112.0 lb

## 2021-02-23 DIAGNOSIS — M25512 Pain in left shoulder: Secondary | ICD-10-CM | POA: Diagnosis not present

## 2021-02-23 NOTE — Patient Instructions (Signed)
Continue your home exercises (arm swings, circles, table slides) to regain motion. Advil as needed as you have been. Consider repeat injection if you plateau or physical therapy as we discussed. Call me with any questions (or mychart message) otherwise follow up as needed.

## 2021-02-23 NOTE — Progress Notes (Signed)
PCP: Crecencio Mc, MD  Subjective:   HPI: Patient is a 55 y.o. female here for left shoulder pain.  9/7: Seen today for evaluation of left shoulder pain present for the past 3 months.  Denies inciting event or trauma at that time.  No history of prior injury to the left shoulder, but does have a history of adhesive capsulitis of the right shoulder 3 years ago that took 14 months to recover though current pain worse than when she had this in right shoulder. Pain is most noticeable at night.  She states that she is unable to find a comfortable position, even endorsing discomfort with sleeping on her right side because there is discomfort with draping her left arm across her body. She has appreciated a diminished range of motion in her left shoulder, stating that she is unable to reach above her head with her left arm and is unable to reach behind her to undo her bra strap. She is also noted some pain radiating to her left elbow.  Ms. Yeakel has tried performing PT exercises at home that she saved from when she previously had adhesive capsulitis of the right shoulder.  Despite routinely performing these exercises her pain has persisted. She is currently using ibuprofen, Voltaren gel and alternating heat/ice as needed for pain with minimal relief.  10/10: Patient reports her left shoulder is improving. Much better at nighttime. Motion has improved. Rarely taking advil. Doing home exercises regularly.  Past Medical History:  Diagnosis Date   Anxiety    Fibroid    Gallstones    GERD (gastroesophageal reflux disease) Aug 2012   with esophagitis by EGD   Hematuria    Hx of colonic polyps August 2012   repeat due 2016,  5 polyps 2009, clear 2012 ([place])   Menorrhagia    did not tolerate trial of ocps due to migraines   Migraine headache    Migraine syndrome    since age 5,  sporadic   Skin cancer     Current Outpatient Medications on File Prior to Visit  Medication Sig  Dispense Refill   COVID-19 At Home Antigen Test ([facility] COVID-19 HOME TEST) KIT use as directed within package instructions 2 kit 0   cyclobenzaprine (FLEXERIL) 10 MG tablet Take 1 tablet (10 mg total) by mouth at bedtime. 90 tablet 1   hyoscyamine (ANASPAZ) 0.125 MG TBDP disintergrating tablet PLACE 1 TABLET UNDER THE TONGUE EVERY 6 HOURS AS NEEDED. 30 tablet 0   levofloxacin (LEVAQUIN) 500 MG tablet Take 1 tablet (500 mg total) by mouth daily. 7 tablet 0   pantoprazole (PROTONIX) 40 MG tablet Take 1 tablet (40 mg total) by mouth daily. 90 tablet 2   sucralfate (CARAFATE) 1 g tablet Take 1 tablet (1 g total) by mouth 3 (three) times daily as needed. 90 tablet 1   SUMAtriptan (IMITREX) 100 MG tablet Take 1 tablet (100 mg total) by mouth once for 1 dose. May repeat in 2 hours if needed 10 tablet 5   No current facility-administered medications on file prior to visit.    Past Surgical History:  Procedure Laterality Date   BREAST BIOPSY Right 2017   benign   COLONOSCOPY WITH PROPOFOL N/A 07/31/2018   Procedure: COLONOSCOPY WITH PROPOFOL;  Surgeon: Lollie Sails, MD;  Location: [facility] ENDOSCOPY;  Service: Endoscopy;  Laterality: N/A;   CYSTOSCOPY     ESOPHAGOGASTRODUODENOSCOPY (EGD) WITH PROPOFOL N/A 07/31/2018   Procedure: ESOPHAGOGASTRODUODENOSCOPY (EGD) WITH PROPOFOL;  Surgeon: Loistine Simas  U, MD;  Location: [facility] ENDOSCOPY;  Service: Endoscopy;  Laterality: N/A;   laparoscopy     VAGINAL HYSTERECTOMY  6/13   with morcellation    Allergies  Allergen Reactions   Propoxyphene     Other reaction(s): Other (See Comments) Unknown   Tetracyclines & Related Diarrhea and Nausea Only    Ht _0  (1.575 m)   Wt 112 lb (50.8 kg)   LMP 02/20/2011   BMI 20.49 kg/m   No flowsheet data found.  No flowsheet data found.      Objective:  Physical Exam:  Gen: NAD, comfortable in exam room  Left shoulder: No swelling, ecchymoses.  No gross deformity. No TTP. Lacks about 20  degrees ER, flexion, and abduction compared to opposite side. Negative Hawkins, positive Neers. Negative Yergasons. Strength 5/5 with empty can and resisted internal/external rotation.  Pain empty can. NV intact distally.   Assessment & Plan:  1. Left shoulder pain - 2/2 adhesive capsulitis.  Improving s/p one intraarticular injection, home exercise program.  Advil as needed.  Continue home exercises.  Consider repeat injection, physical therapy if not improving.

## 2021-03-23 ENCOUNTER — Other Ambulatory Visit: Payer: Self-pay

## 2021-07-24 ENCOUNTER — Other Ambulatory Visit: Payer: Self-pay

## 2021-08-03 ENCOUNTER — Encounter: Payer: Self-pay | Admitting: Emergency Medicine

## 2021-08-03 ENCOUNTER — Ambulatory Visit
Admission: EM | Admit: 2021-08-03 | Discharge: 2021-08-03 | Disposition: A | Discharge status: Home / Self Care | Payer: 59 | Attending: Emergency Medicine | Admitting: Emergency Medicine

## 2021-08-03 ENCOUNTER — Other Ambulatory Visit: Payer: Self-pay

## 2021-08-03 DIAGNOSIS — R300 Dysuria: Secondary | ICD-10-CM | POA: Insufficient documentation

## 2021-08-03 LAB — POCT URINALYSIS DIP (MANUAL ENTRY)
Bilirubin, UA: NEGATIVE
Glucose, UA: NEGATIVE mg/dL
Ketones, POC UA: NEGATIVE mg/dL
Nitrite, UA: NEGATIVE
Protein Ur, POC: NEGATIVE mg/dL
Spec Grav, UA: 1.015 (ref 1.010–1.025)
Urobilinogen, UA: 0.2 E.U./dL
pH, UA: 5.5 (ref 5.0–8.0)

## 2021-08-03 MED ORDER — CEPHALEXIN 500 MG PO CAPS: 500.0000 mg | ORAL_CAPSULE | Freq: Two times a day (BID) | ORAL | 0 refills | Status: AC

## 2021-08-03 NOTE — ED Triage Notes (Signed)
Pt presents with dysuria and urinary frequency since yesterday.  

## 2021-08-03 NOTE — ED Provider Notes (Signed)
?[facility] ? ? ? ?CSN: [id number] ?Arrival date & time: 08/03/21  1809 ? ? ?  ? ?History   ?Chief Complaint ?Chief Complaint  ?Patient presents with  ? Dysuria  ? Urinary Frequency  ? ? ?HPI ?Stephanie Henderson is a 56 y.o. female.  Patient presents with dysuria and urinary frequency since yesterday evening.  She denies fever, chills, abdominal pain, hematuria, vaginal discharge, pelvic pain, flank pain, or other symptoms.  No treatments at home.  Her medical history includes migraine headaches, GERD, anxiety, Raynaud's, chronic low back pain. ? ?The history is provided by the patient and medical records.  ? ?Past Medical History:  ?Diagnosis Date  ? Anxiety   ? Fibroid   ? Gallstones   ? GERD (gastroesophageal reflux disease) Aug 2012  ? with esophagitis by EGD  ? Hematuria   ? Hx of colonic polyps August 2012  ? repeat due 2016,  5 polyps 2009, clear 2012 Gustavo Lah)  ? Menorrhagia   ? did not tolerate trial of ocps due to migraines  ? Migraine headache   ? Migraine syndrome   ? since age 56,  sporadic  ? Skin cancer   ? ? ?Patient Active Problem List  ? Diagnosis Date Noted  ? History of syncope 06/24/2020  ? Abdominal pain 06/24/2020  ? Chronic SI joint pain 10/24/2019  ? Chronic bilateral low back pain without sciatica 10/24/2019  ? Insomnia 07/18/2019  ? B12 deficiency 07/18/2019  ? History of benign breast biopsy 07/17/2019  ? Reflux esophagitis 08/02/2018  ? Right shoulder pain 07/13/2018  ? Frozen shoulder syndrome 06/13/2018  ? Encounter for preventive health examination 05/27/2018  ? Raynaud phenomenon 05/28/2017  ? Generalized anxiety disorder 05/29/2016  ? S/P hysterectomy 05/29/2016  ? Headache 04/14/2016  ? History of mammogram 03/02/2011  ? Migraine syndrome   ? Hx of colonic polyps   ? GERD (gastroesophageal reflux disease) 12/16/2010  ? ? ?Past Surgical History:  ?Procedure Laterality Date  ? BREAST BIOPSY Right 2017  ? benign  ? COLONOSCOPY WITH PROPOFOL N/A 07/31/2018  ? Procedure:  COLONOSCOPY WITH PROPOFOL;  Surgeon: Lollie Sails, MD;  Location[facility] ENDOSCOPY;  Service: Endoscopy;  Laterality: N/A;  ? CYSTOSCOPY    ? ESOPHAGOGASTRODUODENOSCOPY (EGD) WITH PROPOFOL N/A 07/31/2018  ? Procedure: ESOPHAGOGASTRODUODENOSCOPY (EGD) WITH PROPOFOL;  Surgeon: Lollie Sails, MD;  Location[facility] ENDOSCOPY;  Service: Endoscopy;  Laterality: N/A;  ? laparoscopy    ? VAGINAL HYSTERECTOMY  6/13  ? with morcellation  ? ? ?OB History   ? ? Gravida  ?3  ? Para  ?2  ? Term  ?2  ? Preterm  ?   ? AB  ?1  ? Living  ?2  ?  ? ? SAB  ?1  ? IAB  ?   ? Ectopic  ?   ? Multiple  ?   ? Live Births  ?2  ?   ?  ?  ? ? ? ?Home Medications   ? ?Prior to Admission medications   ?Medication Sig Start Date End Date Taking? Authorizing Provider  ?cephALEXin (KEFLEX) 500 MG capsule Take 1 capsule (500 mg total) by mouth 2 (two) times daily for 5 days. 08/03/21 08/08/21 Yes Sharion Balloon, NP  ?hyoscyamine (ANASPAZ) 0.125 MG TBDP disintergrating tablet PLACE 1 TABLET UNDER THE TONGUE EVERY 6 HOURS AS NEEDED. 06/23/20 08/03/21 Yes Crecencio Mc, MD  ?pantoprazole (PROTONIX) 40 MG tablet Take 1 tablet (40 mg total) by mouth daily.  08/19/20  Yes Crecencio Mc, MD  ?sucralfate (CARAFATE) 1 g tablet Take 1 tablet (1 g total) by mouth 3 (three) times daily as needed. 08/19/20  Yes Crecencio Mc, MD  ?SUMAtriptan (IMITREX) 100 MG tablet Take 1 tablet (100 mg total) by mouth once for 1 dose. May repeat in 2 hours if needed 05/25/18 08/03/21 Yes Crecencio Mc, MD  ?COVID-19 At Home Antigen Test [facility] COVID-19 HOME TEST) KIT use as directed within package instructions 11/11/20   Letta Median, [facility]  ?cyclobenzaprine (FLEXERIL) 10 MG tablet Take 1 tablet (10 mg total) by mouth at bedtime. 08/19/20   Crecencio Mc, MD  ?levofloxacin (LEVAQUIN) 500 MG tablet Take 1 tablet (500 mg total) by mouth daily. 09/01/20   Chevis Pretty, FNP  ? ? ?Family History ?Family History  ?Problem Relation Age of Onset  ? Hyperlipidemia Mother   ?  Mental illness Mother   ?     depression  ? Dementia Mother   ? Colon polyps Mother   ? Irritable bowel syndrome Mother   ? Colon cancer Father 57  ? Depression Father   ? Mental illness Father   ? Diabetes Neg Hx   ? Heart disease Neg Hx   ? Breast cancer Neg Hx   ? Ovarian cancer Neg Hx   ? Pancreatic cancer Neg Hx   ? Stomach cancer Neg Hx   ? Liver disease Neg Hx   ? ? ?Social History ?Social History  ? ?Tobacco Use  ? Smoking status: Never  ? Smokeless tobacco: Never  ?Vaping Use  ? Vaping Use: Never used  ?Substance Use Topics  ? Alcohol use: Yes  ?  Alcohol/week: 3.0 standard drinks  ?  Types: 3 Glasses of wine per week  ? Drug use: No  ? ? ? ?Allergies   ?Propoxyphene and Tetracyclines & related ? ? ?Review of Systems ?Review of Systems  ?Constitutional:  Negative for chills and fever.  ?Gastrointestinal:  Negative for abdominal pain, nausea and vomiting.  ?Genitourinary:  Positive for dysuria and frequency. Negative for flank pain, hematuria, pelvic pain and vaginal discharge.  ?Skin:  Negative for color change and rash.  ?All other systems reviewed and are negative. ? ? ?Physical Exam ?Triage Vital Signs ?ED Triage Vitals [08/03/21 1840]  ?Enc Vitals Group  ?   BP   ?   Pulse   ?   Resp   ?   Temp   ?   Temp src   ?   SpO2   ?   Weight   ?   Height   ?   Head Circumference   ?   Peak Flow   ?   Pain Score 0  ?   Pain Loc   ?   Pain Edu?   ?   Excl. in [place]?   ? ?No data found. ? ?Updated Vital Signs ?BP (!) 144/78   Pulse 97   Temp 98.1 ?F (36.7 ?C)   Resp 18   LMP 02/20/2011   SpO2 98%  ? ?Visual Acuity ?Right Eye Distance:   ?Left Eye Distance:   ?Bilateral Distance:   ? ?Right Eye Near:   ?Left Eye Near:    ?Bilateral Near:    ? ?Physical Exam ?Vitals and nursing note reviewed.  ?Constitutional:   ?   General: She is not in acute distress. ?   Appearance: Normal appearance. She is well-developed. She is not ill-appearing.  ?HENT:  ?  Mouth/Throat:  ?   Mouth: Mucous membranes are moist.   ?Cardiovascular:  ?   Rate and Rhythm: Normal rate and regular rhythm.  ?   Heart sounds: Normal heart sounds.  ?Pulmonary:  ?   Effort: Pulmonary effort is normal. No respiratory distress.  ?   Breath sounds: Normal breath sounds.  ?Abdominal:  ?   General: Bowel sounds are normal.  ?   Palpations: Abdomen is soft.  ?   Tenderness: There is no abdominal tenderness. There is no right CVA tenderness, left CVA tenderness, guarding or rebound.  ?Musculoskeletal:  ?   Cervical back: Neck supple.  ?Skin: ?   General: Skin is warm and dry.  ?Neurological:  ?   Mental Status: She is alert.  ?Psychiatric:     ?   Mood and Affect: Mood normal.     ?   Behavior: Behavior normal.  ? ? ? ?UC Treatments / Results  ?Labs ?(all labs ordered are listed, but only abnormal results are displayed) ?Labs Reviewed  ?POCT URINALYSIS DIP (MANUAL ENTRY) - Abnormal; Notable for the following components:  ?    Result Value  ? Clarity, UA cloudy (*)   ? Blood, UA moderate (*)   ? Leukocytes, UA Large (3+) (*)   ? All other components within normal limits  ?URINE CULTURE  ? ? ?EKG ? ? ?Radiology ?No results found. ? ?Procedures ?Procedures (including critical care time) ? ?Medications Ordered in UC ?Medications - No data to display ? ?Initial Impression / Assessment and Plan / UC Course  ?I have reviewed the triage vital signs and the nursing notes. ? ?Pertinent labs & imaging results that were available during my care of the patient were reviewed by me and considered in my medical decision making (see chart for details). ? ?  ?Dysuria.  Treating with Keflex. Urine culture pending. Discussed with patient that we will call her if the urine culture shows the need to change or discontinue the antibiotic. Instructed her to follow-up with her PCP if her symptoms are not improving. Patient agrees to plan of care.    ? ?Final Clinical Impressions(s) / UC Diagnoses  ? ?Final diagnoses:  ?Dysuria  ? ? ? ?Discharge Instructions   ? ?  ?Take the  antibiotic as directed.  The urine culture is pending.  We will call you if it shows the need to change or discontinue your antibiotic.   ? ?Follow up with your primary care provider if your symptoms are not improving.   ? ? ? ? ?

## 2021-08-03 NOTE — Discharge Instructions (Addendum)
Take the antibiotic as directed.  The urine culture is pending.  We will call you if it shows the need to change or discontinue your antibiotic.    Follow up with your primary care provider if your symptoms are not improving.    

## 2021-08-06 LAB — URINE CULTURE: Culture: 70000 — AB

## 2021-08-21 ENCOUNTER — Encounter: Payer: 59 | Admitting: Internal Medicine

## 2021-08-24 ENCOUNTER — Encounter: Payer: 59 | Admitting: Internal Medicine

## 2021-08-25 ENCOUNTER — Ambulatory Visit
Admission: RE | Admit: 2021-08-25 | Discharge: 2021-08-25 | Disposition: A | Discharge status: Home / Self Care | Payer: 59 | Source: Ambulatory Visit | Attending: Internal Medicine | Admitting: Internal Medicine

## 2021-08-25 ENCOUNTER — Other Ambulatory Visit: Payer: Self-pay

## 2021-08-25 ENCOUNTER — Encounter: Payer: Self-pay | Admitting: Internal Medicine

## 2021-08-25 ENCOUNTER — Ambulatory Visit: Payer: 59 | Admitting: Internal Medicine

## 2021-08-25 VITALS — BP 112/76 | HR 106 | Temp 98.7°F | Resp 14 | Ht 62.0 in | Wt 108.0 lb

## 2021-08-25 DIAGNOSIS — N3001 Acute cystitis with hematuria: Secondary | ICD-10-CM | POA: Diagnosis not present

## 2021-08-25 DIAGNOSIS — R109 Unspecified abdominal pain: Secondary | ICD-10-CM | POA: Diagnosis not present

## 2021-08-25 DIAGNOSIS — K589 Irritable bowel syndrome without diarrhea: Secondary | ICD-10-CM

## 2021-08-25 DIAGNOSIS — K8020 Calculus of gallbladder without cholecystitis without obstruction: Secondary | ICD-10-CM | POA: Diagnosis not present

## 2021-08-25 DIAGNOSIS — I700 Atherosclerosis of aorta: Secondary | ICD-10-CM | POA: Diagnosis not present

## 2021-08-25 DIAGNOSIS — R1084 Generalized abdominal pain: Secondary | ICD-10-CM | POA: Diagnosis not present

## 2021-08-25 LAB — COMPREHENSIVE METABOLIC PANEL
ALT: 19 U/L (ref 0–35)
AST: 19 U/L (ref 0–37)
Albumin: 4.9 g/dL (ref 3.5–5.2)
Alkaline Phosphatase: 40 U/L (ref 39–117)
BUN: 17 mg/dL (ref 6–23)
CO2: 30 mEq/L (ref 19–32)
Calcium: 10.3 mg/dL (ref 8.4–10.5)
Chloride: 102 mEq/L (ref 96–112)
Creatinine, Ser: 0.57 mg/dL (ref 0.40–1.20)
GFR: 103.23 mL/min (ref >60.00)
Glucose, Bld: 88 mg/dL (ref 70–99)
Potassium: 4.1 mEq/L (ref 3.5–5.1)
Sodium: 139 mEq/L (ref 135–145)
Total Bilirubin: 0.3 mg/dL (ref 0.2–1.2)
Total Protein: 6.9 g/dL (ref 6.0–8.3)

## 2021-08-25 LAB — CBC WITH DIFFERENTIAL/PLATELET
Basophils Absolute: 0 10*3/uL (ref 0.0–0.1)
Basophils Relative: 0.5 % (ref 0.0–3.0)
Eosinophils Absolute: 0.1 10*3/uL (ref 0.0–0.7)
Eosinophils Relative: 1.2 % (ref 0.0–5.0)
HCT: 41.1 % (ref 36.0–46.0)
Hemoglobin: 13.3 g/dL (ref 12.0–15.0)
Lymphocytes Relative: 37.1 % (ref 12.0–46.0)
Lymphs Abs: 2.9 10*3/uL (ref 0.7–4.0)
MCHC: 32.4 g/dL (ref 30.0–36.0)
MCV: 99.6 fl (ref 78.0–100.0)
Monocytes Absolute: 0.4 10*3/uL (ref 0.1–1.0)
Monocytes Relative: 5.1 % (ref 3.0–12.0)
Neutro Abs: 4.4 10*3/uL (ref 1.4–7.7)
Neutrophils Relative %: 56.1 % (ref 43.0–77.0)
Platelets: 246 10*3/uL (ref 150.0–400.0)
RBC: 4.12 Mil/uL (ref 3.87–5.11)
RDW: 12.8 % (ref 11.5–15.5)
WBC: 7.8 10*3/uL (ref 4.0–10.5)

## 2021-08-25 MED ORDER — IOHEXOL 300 MG/ML  SOLN
100.0000 mL | Freq: Once | INTRAMUSCULAR | Status: AC | PRN
  Administered 2021-08-25: 100 mL via INTRAVENOUS

## 2021-08-25 MED ORDER — PANTOPRAZOLE SODIUM 40 MG PO TBEC
40.0000 mg | DELAYED_RELEASE_TABLET | Freq: Every day | ORAL | 3 refills | Status: DC
  Filled 2021-08-25: qty 90, 90d supply, fill #0
  Filled 2021-11-22: qty 90, 90d supply, fill #1

## 2021-08-25 NOTE — Patient Instructions (Signed)

## 2021-08-25 NOTE — Progress Notes (Signed)
Chief Complaint  ?Patient presents with  ? Acute Visit  ?  Pt c/o abdominal pain ongoing for 2 wks, RUQ & RLQ pain is more constant the past couple of days. Denies any N/V/D, hx of gallstones has flare up 1-2 times a yr.   ? ?F/u  ?1. RUQ, RLQ, epigastric pain. Appetite is less but not due to pain.  H/o gallstones years ago (20 years) but x 2 weeks on and off more and more constant like an ache esp last couple of days no n/v.normally with gallstone pain this flares 1-2 x per year. Pain is 4-5/10 nothing tried h/o hysterectomy in 2013 for fibroids and still has her ovaries  ?H/o ibs and recently uti tx'ed keflex end of 07/2021 ? ?Review of Systems  ?Constitutional:  Negative for weight loss.  ?HENT:  Negative for hearing loss.   ?Eyes:  Negative for blurred vision.  ?Respiratory:  Negative for shortness of breath.   ?Cardiovascular:  Negative for chest pain.  ?Gastrointestinal:  Positive for abdominal pain. Negative for blood in stool, nausea and vomiting.  ?Genitourinary:  Negative for dysuria.  ?Musculoskeletal:  Negative for falls and joint pain.  ?Skin:  Negative for rash.  ?Neurological:  Negative for headaches.  ?Psychiatric/Behavioral:  Negative for depression.   ?Past Medical History:  ?Diagnosis Date  ? Anxiety   ? Fibroid   ? Gallstones   ? GERD (gastroesophageal reflux disease) Aug 2012  ? with esophagitis by EGD  ? Hematuria   ? Hx of colonic polyps August 2012  ? repeat due 2016,  5 polyps 2009, clear 2012 Gustavo Lah)  ? Menorrhagia   ? did not tolerate trial of ocps due to migraines  ? Migraine headache   ? Migraine syndrome   ? since age 62,  sporadic  ? Skin cancer   ? ?Past Surgical History:  ?Procedure Laterality Date  ? BREAST BIOPSY Right 2017  ? benign  ? COLONOSCOPY WITH PROPOFOL N/A 07/31/2018  ? Procedure: COLONOSCOPY WITH PROPOFOL;  Surgeon: Lollie Sails, MD;  Location: [facility] ENDOSCOPY;  Service: Endoscopy;  Laterality: N/A;  ? CYSTOSCOPY    ? ESOPHAGOGASTRODUODENOSCOPY (EGD) WITH PROPOFOL  N/A 07/31/2018  ? Procedure: ESOPHAGOGASTRODUODENOSCOPY (EGD) WITH PROPOFOL;  Surgeon: Lollie Sails, MD;  Location: [facility] ENDOSCOPY;  Service: Endoscopy;  Laterality: N/A;  ? laparoscopy    ? VAGINAL HYSTERECTOMY  6/13  ? with morcellation  ? ?Family History  ?Problem Relation Age of Onset  ? Hyperlipidemia Mother   ? Mental illness Mother   ?     depression  ? Dementia Mother   ? Colon polyps Mother   ? Irritable bowel syndrome Mother   ? Colon cancer Father 29  ? Depression Father   ? Mental illness Father   ? Diabetes Neg Hx   ? Heart disease Neg Hx   ? Breast cancer Neg Hx   ? Ovarian cancer Neg Hx   ? Pancreatic cancer Neg Hx   ? Stomach cancer Neg Hx   ? Liver disease Neg Hx   ? ?Social History  ? ?Socioeconomic History  ? Marital status: Married  ?  Spouse name: Not on file  ? Number of children: 2  ? Years of education: Not on file  ? Highest education level: Not on file  ?Occupational History  ? Occupation: Therapist, sports - [employer]  ?Tobacco Use  ? Smoking status: Never  ? Smokeless tobacco: Never  ?Vaping Use  ? Vaping Use: Never used  ?  Substance and Sexual Activity  ? Alcohol use: Yes  ?  Alcohol/week: 3.0 standard drinks  ?  Types: 3 Glasses of wine per week  ? Drug use: No  ? Sexual activity: Yes  ?  Birth control/protection: Surgical  ?Other Topics Concern  ? Not on file  ?Social History Narrative  ? Not on file  ? ?Social Determinants of Health  ? ?Financial Resource Strain: Not on file  ?Food Insecurity: Not on file  ?Transportation Needs: Not on file  ?Physical Activity: Not on file  ?Stress: Not on file  ?Social Connections: Not on file  ?Intimate Partner Violence: Not on file  ? ?Current Meds  ?Medication Sig  ? cyclobenzaprine (FLEXERIL) 10 MG tablet Take 1 tablet (10 mg total) by mouth at bedtime.  ? hyoscyamine (ANASPAZ) 0.125 MG TBDP disintergrating tablet PLACE 1 TABLET UNDER THE TONGUE EVERY 6 HOURS AS NEEDED.  ? pantoprazole (PROTONIX) 40 MG tablet Take 1 tablet (40 mg total) by  mouth daily.  ? sucralfate (CARAFATE) 1 g tablet Take 1 tablet (1 g total) by mouth 3 (three) times daily as needed.  ? SUMAtriptan (IMITREX) 100 MG tablet Take 1 tablet (100 mg total) by mouth once for 1 dose. May repeat in 2 hours if needed  ? ?Allergies  ?Allergen Reactions  ? Propoxyphene   ?  Other reaction(s): Other (See Comments) ?Unknown  ? Tetracyclines & Related Diarrhea and Nausea Only  ? ?Recent Results (from the past 2160 hour(s))  ?POCT urinalysis dipstick     Status: Abnormal  ? Collection Time: 08/03/21  6:43 PM  ?Result Value Ref Range  ? Color, UA yellow yellow  ? Clarity, UA cloudy (A) clear  ? Glucose, UA negative negative mg/dL  ? Bilirubin, UA negative negative  ? Ketones, POC UA negative negative mg/dL  ? Spec Grav, UA 1.015 1.010 - 1.025  ? Blood, UA moderate (A) negative  ? pH, UA 5.5 5.0 - 8.0  ? Protein Ur, POC negative negative mg/dL  ? Urobilinogen, UA 0.2 0.2 or 1.0 E.U./dL  ? Nitrite, UA Negative Negative  ? Leukocytes, UA Large (3+) (A) Negative  ?Urine Culture     Status: Abnormal  ? Collection Time: 08/03/21  6:58 PM  ? Specimen: Urine, Clean Catch  ?Result Value Ref Range  ? Specimen Description URINE, CLEAN CATCH   ? Special Requests    ?  NONE ?Performed at [facility], [place] [address] [place], [place] [address] ?  ? Culture 70,000 COLONIES/mL ESCHERICHIA COLI (A)   ? Report Status 08/06/2021 FINAL   ? Organism ID, Bacteria ESCHERICHIA COLI (A)   ?    Susceptibility  ? Escherichia coli - MIC*  ?  AMPICILLIN >=32 RESISTANT Resistant   ?  CEFAZOLIN <=4 SENSITIVE Sensitive   ?  CEFEPIME <=0.12 SENSITIVE Sensitive   ?  CEFTRIAXONE <=0.25 SENSITIVE Sensitive   ?  CIPROFLOXACIN <=0.25 SENSITIVE Sensitive   ?  GENTAMICIN <=1 SENSITIVE Sensitive   ?  IMIPENEM <=0.25 SENSITIVE Sensitive   ?  NITROFURANTOIN <=16 SENSITIVE Sensitive   ?  TRIMETH/SULFA <=20 SENSITIVE Sensitive   ?  AMPICILLIN/SULBACTAM >=32 RESISTANT Resistant   ?  PIP/TAZO <=4 SENSITIVE Sensitive   ?  * 70,000  COLONIES/mL ESCHERICHIA COLI  ? ?Objective  ?Body mass index is 19.75 kg/m?. ?Wt Readings from Last 3 Encounters:  ?08/25/21 108 lb (49 kg)  ?02/23/21 112 lb (50.8 kg)  ?01/21/21 112 lb (50.8 kg)  ? ?Temp Readings from  Last 3 Encounters:  ?08/25/21 98.7 ?F (37.1 ?C) (Oral)  ?08/03/21 98.1 ?F (36.7 ?C)  ?08/19/20 98.1 ?F (36.7 ?C) (Oral)  ? ?BP Readings from Last 3 Encounters:  ?08/25/21 112/76  ?08/03/21 (!) 144/78  ?08/19/20 100/60  ? ?Pulse Readings from Last 3 Encounters:  ?08/25/21 (!) 106  ?08/03/21 97  ?08/19/20 80  ? ? ?Physical Exam ?Vitals and nursing note reviewed.  ?Constitutional:   ?   Appearance: Normal appearance. She is well-developed and well-groomed.  ?HENT:  ?   Head: Normocephalic and atraumatic.  ?Eyes:  ?   Conjunctiva/sclera: Conjunctivae normal.  ?   Pupils: Pupils are equal, round, and reactive to light.  ?Cardiovascular:  ?   Rate and Rhythm: Normal rate and regular rhythm.  ?   Heart sounds: Normal heart sounds. No murmur heard. ?Pulmonary:  ?   Effort: Pulmonary effort is normal.  ?   Breath sounds: Normal breath sounds.  ?Abdominal:  ?   General: Abdomen is flat. Bowel sounds are normal.  ?   Tenderness: There is no abdominal tenderness.  ?Musculoskeletal:     ?   General: No tenderness.  ?Skin: ?   General: Skin is warm and dry.  ?Neurological:  ?   General: No focal deficit present.  ?   Mental Status: She is alert and oriented to person, place, and time. Mental status is at baseline.  ?   Cranial Nerves: Cranial nerves 2-12 are intact.  ?   Sensory: Sensation is intact.  ?   Motor: Motor function is intact.  ?   Coordination: Coordination is intact.  ?   Gait: Gait is intact.  ?Psychiatric:     ?   Attention and Perception: Attention and perception normal.     ?   Mood and Affect: Mood and affect normal.     ?   Speech: Speech normal.     ?   Behavior: Behavior normal. Behavior is cooperative.     ?   Thought Content: Thought content normal.     ?   Cognition and Memory: Cognition and  memory normal.     ?   Judgment: Judgment normal.  ? ? ?Assessment  ?Plan  ?Generalized abdominal pain - Plan: Creatinine, CT ABDOMEN PELVIS W CONTRAST, Comprehensive metabolic panel, CBC with Differential/P

## 2021-08-26 ENCOUNTER — Encounter: Payer: Self-pay | Admitting: Internal Medicine

## 2021-08-26 LAB — URINALYSIS, ROUTINE W REFLEX MICROSCOPIC
Bilirubin Urine: NEGATIVE
Glucose, UA: NEGATIVE
Hgb urine dipstick: NEGATIVE
Ketones, ur: NEGATIVE
Leukocytes,Ua: NEGATIVE
Nitrite: NEGATIVE
Protein, ur: NEGATIVE
Specific Gravity, Urine: 1.006 (ref 1.001–1.035)
pH: 6 (ref 5.0–8.0)

## 2021-08-26 LAB — URINE CULTURE
MICRO NUMBER:: 13248021
Result:: NO GROWTH
SPECIMEN QUALITY:: ADEQUATE

## 2021-08-28 DIAGNOSIS — K8020 Calculus of gallbladder without cholecystitis without obstruction: Secondary | ICD-10-CM | POA: Insufficient documentation

## 2021-08-28 NOTE — Addendum Note (Signed)
Addended by: Orland Mustard on: 08/28/2021 08:04 AM ? ? Modules accepted: Orders ? ?

## 2021-09-08 ENCOUNTER — Encounter: Payer: 59 | Admitting: Internal Medicine

## 2021-09-14 ENCOUNTER — Ambulatory Visit (INDEPENDENT_AMBULATORY_CARE_PROVIDER_SITE_OTHER): Payer: 59 | Admitting: Internal Medicine

## 2021-09-14 ENCOUNTER — Other Ambulatory Visit: Payer: Self-pay

## 2021-09-14 ENCOUNTER — Encounter: Payer: Self-pay | Admitting: Internal Medicine

## 2021-09-14 VITALS — BP 112/74 | HR 93 | Temp 98.7°F | Ht 62.0 in | Wt 113.4 lb

## 2021-09-14 DIAGNOSIS — R1031 Right lower quadrant pain: Secondary | ICD-10-CM

## 2021-09-14 DIAGNOSIS — Z0001 Encounter for general adult medical examination with abnormal findings: Secondary | ICD-10-CM

## 2021-09-14 DIAGNOSIS — I700 Atherosclerosis of aorta: Secondary | ICD-10-CM

## 2021-09-14 DIAGNOSIS — Z0000 Encounter for general adult medical examination without abnormal findings: Secondary | ICD-10-CM

## 2021-09-14 DIAGNOSIS — K8020 Calculus of gallbladder without cholecystitis without obstruction: Secondary | ICD-10-CM | POA: Diagnosis not present

## 2021-09-14 DIAGNOSIS — Z1231 Encounter for screening mammogram for malignant neoplasm of breast: Secondary | ICD-10-CM | POA: Diagnosis not present

## 2021-09-14 DIAGNOSIS — E559 Vitamin D deficiency, unspecified: Secondary | ICD-10-CM

## 2021-09-14 LAB — VITAMIN D 25 HYDROXY (VIT D DEFICIENCY, FRACTURES): VITD: 29.88 ng/mL — ABNORMAL LOW (ref 30.00–100.00)

## 2021-09-14 MED ORDER — ALPRAZOLAM 0.5 MG PO TABS
ORAL_TABLET | ORAL | 5 refills | Status: AC
  Filled 2021-09-14: qty 30, 30d supply, fill #0
  Filled 2021-11-22: qty 30, 30d supply, fill #1

## 2021-09-14 NOTE — Assessment & Plan Note (Addendum)

## 2021-09-14 NOTE — Assessment & Plan Note (Addendum)
RLQ, sided , intermittent, aggravated by twisting.  Labs and abdominal CT have been done and reviewed with patient : normal.  No sign  Of appendicitis or cholecystitis or diverticulitis.  Right ovary not seen.  Appears to be musculoskeletal in origin , but she has an appointment with Gastroenterology (self referred) in 48 hours. ?

## 2021-09-14 NOTE — Assessment & Plan Note (Addendum)
Noted on recent CTl without GB Wall thickening and CBD dilation. Her current reports of abd pai and not related to eating and not RUQ  ?

## 2021-09-14 NOTE — Progress Notes (Signed)
The patient is here for annual preventive examination and management of other chronic and acute problems. ?  ?The risk factors are reflected in the social history. ?  ?The roster of all physicians providing medical care to patient - is listed in the Snapshot section of the chart. ?  ?Activities of daily living:  The patient is 100% independent in all ADLs: dressing, toileting, feeding as well as independent mobility ?  ?Home safety : The patient has smoke detectors in the home. They wear seatbelts.  There are no unsecured firearms at home. There is no violence in the home.  ?  ?There is no risks for hepatitis, STDs or HIV. There is no   history of blood transfusion. They have no travel history to infectious disease endemic areas of the world. ?  ?The patient has seen their dentist in the last six month. They have seen their eye doctor in the last year. The patinet  denies slight hearing difficulty with regard to whispered voices and some television programs.  They have deferred audiologic testing in the last year.  They do not  have excessive sun exposure. Discussed the need for sun protection: hats, long sleeves and use of sunscreen if there is significant sun exposure.  ?  ?Diet: the importance of a healthy diet is discussed. They do have a healthy diet. ?  ?The benefits of regular aerobic exercise were discussed. The patient  exercises  3 to 5 days per week  for  60 minutes.  ?  ?Depression screen: there are no signs or vegative symptoms of depression- irritability, change in appetite, anhedonia, sadness/tearfullness. ?  ?The following portions of the patient's history were reviewed and updated as appropriate: allergies, current medications, past family history, past medical history,  past surgical history, past social history  and problem list. ?  ?Visual acuity was not assessed per patient preference since the patient has regular follow up with an  ophthalmologist. Hearing and body mass index were assessed and  reviewed.  ?  ?During the course of the visit the patient was educated and counseled about appropriate screening and preventive services including : fall prevention , diabetes screening, nutrition counseling, colorectal cancer screening, and recommended immunizations.   ? ?Chief Complaint: ? ?History of recurrent Right sided t pain  sone month ago after stopping protonix for chronic gastritis.   Started off as mild,  became more intense,  saw [facility] ; labs and abd CT  normal.  Pain now better but brought on with twisting torso while supine.  Otherwise not present when supine.  Only present intermittently during the day.  Some bloating , intermittent  no nausea.  Sees [facility] on May 3.  ? ?Review of Symptoms ? ?Patient denies headache, fevers, malaise, unintentional weight loss, skin rash, eye pain, sinus congestion and sinus pain, sore throat, dysphagia,  hemoptysis , cough, dyspnea, wheezing, chest pain, palpitations, orthopnea, edema,  nausea, melena, diarrhea, constipation, flank pain, dysuria, hematuria, urinary  Frequency, nocturia, numbness, tingling, seizures,  Focal weakness, Loss of consciousness,  Tremor, insomnia, depression, anxiety, and suicidal ideation.   ? ?Physical Exam: ? ?BP 112/74 (BP Location: Left Arm, Patient Position: Sitting, Cuff Size: Normal)   Pulse 93   Temp 98.7 ?F (37.1 ?C) (Oral)   Ht _0  (1.575 m)   Wt 113 lb 6.4 oz (51.4 kg)   LMP 02/20/2011   SpO2 98%   BMI 20.74 kg/m?   ? ? ? ?Assessment and Plan: ? ?Encounter for  preventive health examination ?age appropriate education and counseling updated, referrals for preventative services and immunizations addressed, dietary and smoking counseling addressed, most recent labs reviewed.  I have personally reviewed and have noted: ?  ?1) the patient's medical and social history ?2) The pt's use of alcohol, tobacco, and illicit drugs ?3) The patient's current medications and supplements ?4) Functional ability including ADL's, fall  risk, home safety risk, hearing and visual impairment ?5) Diet and physical activities ?6) Evidence for depression or mood disorder ?7) The patient's height, weight, and BMI have been recorded in the chart ?  ?I have made referrals, and provided counseling and education based on review of the above ? ?Abdominal pain ?RLQ, sided , intermittent, aggravated by twisting.  Labs and abdominal CT have been done and reviewed with patient : normal.  No sign  Of appendicitis or cholecystitis or diverticulitis.  Right ovary not seen.  Appears to be musculoskeletal in origin , but she has an appointment with Gastroenterology (self referred) in 48 hours. ? ?Gallstones ?Noted on recent CTl without GB Wall thickening and CBD dilation. Her current reports of abd pai and not related to eating and not RUQ  ? ? ?Updated Medication List ?Outpatient Encounter Medications as of 09/14/2021  ?Medication Sig  ? cyclobenzaprine (FLEXERIL) 10 MG tablet Take 1 tablet (10 mg total) by mouth at bedtime.  ? pantoprazole (PROTONIX) 40 MG tablet Take 1 tablet (40 mg total) by mouth daily.  ? ALPRAZolam (XANAX) 0.5 MG tablet TAKE 1 TABLET BY MOUTH AT BEDTIME AS NEEDED FOR ANXIETY.  ? hyoscyamine (ANASPAZ) 0.125 MG TBDP disintergrating tablet PLACE 1 TABLET UNDER THE TONGUE EVERY 6 HOURS AS NEEDED. (Patient not taking: Reported on 09/14/2021)  ? sucralfate (CARAFATE) 1 g tablet Take 1 tablet (1 g total) by mouth 3 (three) times daily as needed. (Patient not taking: Reported on 09/14/2021)  ? SUMAtriptan (IMITREX) 100 MG tablet Take 1 tablet (100 mg total) by mouth once for 1 dose. May repeat in 2 hours if needed (Patient not taking: Reported on 09/14/2021)  ? [DISCONTINUED] COVID-19 At Home Antigen Test [facility] COVID-19 HOME TEST) KIT use as directed within package instructions  ? [DISCONTINUED] levofloxacin (LEVAQUIN) 500 MG tablet Take 1 tablet (500 mg total) by mouth daily.  ? ?No facility-administered encounter medications on file as of 09/14/2021.  ?   ?

## 2021-09-15 LAB — LIPID PANEL W/REFLEX DIRECT LDL
Cholesterol: 203 mg/dL — ABNORMAL HIGH (ref <200)
HDL: 58 mg/dL (ref >=50)
LDL Cholesterol (Calc): 129 mg/dL (calc) — ABNORMAL HIGH
Non-HDL Cholesterol (Calc): 145 mg/dL (calc) — ABNORMAL HIGH (ref <130)
Total CHOL/HDL Ratio: 3.5 (calc) (ref <5.0)
Triglycerides: 66 mg/dL (ref <150)

## 2021-09-16 ENCOUNTER — Encounter: Payer: Self-pay | Admitting: Physician Assistant

## 2021-09-16 ENCOUNTER — Ambulatory Visit: Payer: 59 | Admitting: Physician Assistant

## 2021-09-16 VITALS — BP 114/68 | HR 80 | Ht 62.0 in | Wt 112.6 lb

## 2021-09-16 DIAGNOSIS — K8020 Calculus of gallbladder without cholecystitis without obstruction: Secondary | ICD-10-CM

## 2021-09-16 DIAGNOSIS — R1011 Right upper quadrant pain: Secondary | ICD-10-CM | POA: Diagnosis not present

## 2021-09-16 DIAGNOSIS — Z8719 Personal history of other diseases of the digestive system: Secondary | ICD-10-CM

## 2021-09-16 DIAGNOSIS — K219 Gastro-esophageal reflux disease without esophagitis: Secondary | ICD-10-CM | POA: Diagnosis not present

## 2021-09-16 NOTE — Patient Instructions (Signed)
If you are age 54 or younger, your body mass index should be between 19-25. Your Body mass index is 20.59 kg/m?Marland Kitchen If this is out of the aformentioned range listed, please consider follow up with your Primary Care Provider.  ?________________________________________________________ ? ?The [facility] providers would like to encourage you to use [facility] to communicate with providers for non-urgent requests or questions.  Due to long hold times on the telephone, sending your provider a message by [facility] may be a faster and more efficient way to get a response.  Please allow 48 business hours for a response.  Please remember that this is for non-urgent requests.  ?_______________________________________________________ ? ?Continue Pantoprazole  ? ?Stay off NSAID ? ?Call and speak with [facility] for any recurring symptoms. ? ?Thank you for entrusting me with your care and choosing [facility]. ? ?Nicoletta Ba, PA-C ?

## 2021-09-16 NOTE — Progress Notes (Signed)
? ?Subjective:  ? ? Patient ID: Stephanie Henderson, female    DOB: 1966/03/25, 56 y.o.   MRN: [id number] ? ?HPI ?Stephanie Henderson is a pleasant 56 year old white female, established with Dr. Loletha Carrow, last seen here in March 2022 with IBS type symptoms who comes in today after recent episode of right upper quadrant pain. ?Patient has history of GERD, reflux esophagitis, cholelithiasis, family history of colon cancer, personal history of adenomatous colon polyps, anxiety and is status post hysterectomy. ? She says she generally stays on Protonix chronically for her acid reflux and about once a year will try to come off of it.  She did this towards the end of March and about a week later she started having what she describes as a twinge of pain in her right upper quadrant which later became more of an aching type of discomfort which gradually got worse over the next few weeks.  This was present most days, she says the pain was never intense, never had any radiation, not associated with any nausea or vomiting no fever or chills.  No changes in her bowel habits, she did have some bloating type discomfort.  Is not having any nocturnal symptoms and usually would feel fine each morning.  She resumed the Protonix and says that after 4 to 5 days symptoms were significantly improved.  At this point she is feeling much better over this past week, her bloating and gassiness has resolved.  She has been eating a bland diet, has had some very minimal discomfort intermittently, bloating and belching have resolved. ?He had contacted her PCP who was referring her to surgery and wanted to be seen by GI prior to making a decision about seeing a surgeon for cholelithiasis. ?She did have labs done on 08/25/2021 which are reassuring with normal CBC and chemistries ?CT of the abdomen pelvis with contrast on 08/25/2021 showed cholelithiasis but no evidence of gallbladder wall thickening, no ductal dilation, normal-appearing stomach liver and pancreas.  There  is a left adnexal cyst measuring 2 cm, and otherwise negative exam. ? ?She had undergone endoscopy in March 2020 per Dr. Sandrea Matte with finding of a small hiatal hernia, mild gastritis and gastric polyps.  Path showed reflux esophagitis, reactive gastropathy no H. pylori and no intestinal metaplasia, fundic gland polyps and reflux esophagitis.  Colonoscopy at that same setting with a tortuous sigmoid and descending colon and one 4 mm polyp was removed from the hepatic flexure which was a tubular adenoma.  She is indicated for 5-year interval follow-up due to family history. ? ?Review of Systems.Pertinent positive and negative review of systems were noted in the above HPI section.  All other review of systems was otherwise negative.  ? ?Outpatient Encounter Medications as of 09/16/2021  ?Medication Sig  ? ALPRAZolam (XANAX) 0.5 MG tablet TAKE 1 TABLET BY MOUTH AT BEDTIME AS NEEDED FOR ANXIETY.  ? cyclobenzaprine (FLEXERIL) 10 MG tablet Take 1 tablet (10 mg total) by mouth at bedtime.  ? pantoprazole (PROTONIX) 40 MG tablet Take 1 tablet (40 mg total) by mouth daily.  ? sucralfate (CARAFATE) 1 g tablet Take 1 tablet (1 g total) by mouth 3 (three) times daily as needed.  ? hyoscyamine (ANASPAZ) 0.125 MG TBDP disintergrating tablet PLACE 1 TABLET UNDER THE TONGUE EVERY 6 HOURS AS NEEDED. (Patient not taking: Reported on 09/14/2021)  ? SUMAtriptan (IMITREX) 100 MG tablet Take 1 tablet (100 mg total) by mouth once for 1 dose. May repeat in 2 hours if needed (Patient not  taking: Reported on 09/14/2021)  ? ?No facility-administered encounter medications on file as of 09/16/2021.  ? ?Allergies  ?Allergen Reactions  ? Propoxyphene   ?  Other reaction(s): Other (See Comments) ?Unknown  ? Tetracyclines & Related Diarrhea and Nausea Only  ? ?Patient Active Problem List  ? Diagnosis Date Noted  ? Gallstones 08/28/2021  ? History of syncope 06/24/2020  ? Abdominal pain 06/24/2020  ? Chronic SI joint pain 10/24/2019  ? Chronic  bilateral low back pain without sciatica 10/24/2019  ? Insomnia 07/18/2019  ? B12 deficiency 07/18/2019  ? History of benign breast biopsy 07/17/2019  ? Reflux esophagitis 08/02/2018  ? Right shoulder pain 07/13/2018  ? Frozen shoulder syndrome 06/13/2018  ? Encounter for preventive health examination 05/27/2018  ? Raynaud phenomenon 05/28/2017  ? Generalized anxiety disorder 05/29/2016  ? S/P hysterectomy 05/29/2016  ? Headache 04/14/2016  ? History of mammogram 03/02/2011  ? Migraine syndrome   ? Hx of colonic polyps   ? GERD (gastroesophageal reflux disease) 12/16/2010  ? ?Social History  ? ?Socioeconomic History  ? Marital status: Married  ?  Spouse name: Not on file  ? Number of children: 2  ? Years of education: Not on file  ? Highest education level: Not on file  ?Occupational History  ? Occupation: Therapist, sports - [employer]  ?Tobacco Use  ? Smoking status: Never  ? Smokeless tobacco: Never  ?Vaping Use  ? Vaping Use: Never used  ?Substance and Sexual Activity  ? Alcohol use: Yes  ?  Alcohol/week: 3.0 standard drinks  ?  Types: 3 Glasses of wine per week  ? Drug use: No  ? Sexual activity: Yes  ?  Birth control/protection: Surgical  ?Other Topics Concern  ? Not on file  ?Social History Narrative  ? Not on file  ? ?Social Determinants of Health  ? ?Financial Resource Strain: Not on file  ?Food Insecurity: Not on file  ?Transportation Needs: Not on file  ?Physical Activity: Not on file  ?Stress: Not on file  ?Social Connections: Not on file  ?Intimate Partner Violence: Not on file  ? ? ?Ms. Tse's family history includes Colon cancer (age of onset: 50) in her father; Colon polyps in her mother; Dementia in her mother; Depression in her father; Hyperlipidemia in her mother; Irritable bowel syndrome in her mother; Mental illness in her father and mother. ? ? ?   ?Objective:  ?  ?Vitals:  ? 09/16/21 1517  ?BP: 114/68  ?Pulse: 80  ? ? ?Physical Exam Well-developed well-nourished WF in no acute distress.   EVOJJK,093 BMI 20.5 ? ?HEENT; nontraumatic normocephalic, EOMI, PE R LA, sclera anicteric. ?Oropharynx; not examined today ?Neck; supple, no JVD ?Cardiovascular; regular rate and rhythm with S1-S2, no murmur rub or gallop ?Pulmonary; Clear bilaterally ?Abdomen; soft, nontender, nondistended, no palpable mass or hepatosplenomegaly, bowel sounds are active, lower abdominal incisional scar ?Rectal; not done today ?Skin; benign exam, no jaundice rash or appreciable lesions ?Extremities; no clubbing cyanosis or edema skin warm and dry ?Neuro/Psych; alert and oriented x4, grossly nonfocal mood and affect appropriate  ? ? ? ?   ?Assessment & Plan:  ? ?#37 56 year old female with recent episode of right upper quadrant pain onset towards the end of March and occurring about a week after she had stopped her PPI.  Initially described as a twinge, and then aching in nature, never intense or severe no radiation, no associated nausea or vomiting, no changes in bowel habits, no fever or  chills. ?She did have some associated bloating and belching. ? ?She has had work-up including labs with normal CBC and LFTs 08/25/2021 ?CT of the abdomen and pelvis 08/25/2021 documented previously known cholelithiasis but no gallbladder wall thickening or ductal dilation, 2 cm left adnexal cyst otherwise negative exam. ? ?After resuming her PPI symptoms had significantly improved within 4 to 5 days and at this point overall she is feeling much better, bloating and belching have resolved, right upper quadrant discomfort has been minimal if at all. ? ?She has no tenderness on exam today ? ?I suspect her symptoms may be secondary to gastritis reactivation while off PPI, symptoms are less suggestive of biliary colic and certainly no evidence for cholecystitis. ? ?#2 family history of colon cancer/father ? ?#3.  Chronic GERD maintained on Protonix 40 mg ? ?# 4.  Personal history of adenomatous colon polyp-up-to-date with colonoscopy last done March 2020  due for 5-year interval follow-up ?#5 status post hysterectomy ? ?Plan; continue Protonix 40 mg p.o. every morning AC breakfast ?Gradually advance to regular diet ?I do not think she needs to see surgery a

## 2021-09-21 NOTE — Progress Notes (Signed)
____________________________________________________________ ? ?Attending physician addendum: ? ?Thank you for sending this case to me. ?I have reviewed the entire note and agree with the plan. ? ?I agree the symptoms are atypical for biliary colic, though also not entirely clear why the constellation of symptoms improves on PPI. ?Though she has had recurrent symptoms when she tries to stop the PPI, I still think it would be worthwhile for her to attempt de-escalation to H2 blocker therapy such as famotidine 40 mg twice daily, then decreasing to once daily as symptoms allow. ? ?Wilfrid Lund, MD ? ?____________________________________________________________ ? ?

## 2021-11-22 ENCOUNTER — Other Ambulatory Visit: Payer: Self-pay

## 2021-11-23 ENCOUNTER — Other Ambulatory Visit: Payer: Self-pay

## 2021-12-01 DIAGNOSIS — H52223 Regular astigmatism, bilateral: Secondary | ICD-10-CM | POA: Diagnosis not present

## 2021-12-01 DIAGNOSIS — Z135 Encounter for screening for eye and ear disorders: Secondary | ICD-10-CM | POA: Diagnosis not present

## 2021-12-01 DIAGNOSIS — H5203 Hypermetropia, bilateral: Secondary | ICD-10-CM | POA: Diagnosis not present

## 2021-12-01 DIAGNOSIS — H524 Presbyopia: Secondary | ICD-10-CM | POA: Diagnosis not present

## 2021-12-16 DIAGNOSIS — D485 Neoplasm of uncertain behavior of skin: Secondary | ICD-10-CM | POA: Diagnosis not present

## 2021-12-16 DIAGNOSIS — D2261 Melanocytic nevi of right upper limb, including shoulder: Secondary | ICD-10-CM | POA: Diagnosis not present

## 2021-12-16 DIAGNOSIS — D2272 Melanocytic nevi of left lower limb, including hip: Secondary | ICD-10-CM | POA: Diagnosis not present

## 2021-12-16 DIAGNOSIS — D2262 Melanocytic nevi of left upper limb, including shoulder: Secondary | ICD-10-CM | POA: Diagnosis not present

## 2021-12-16 DIAGNOSIS — L821 Other seborrheic keratosis: Secondary | ICD-10-CM | POA: Diagnosis not present

## 2021-12-16 DIAGNOSIS — Z85828 Personal history of other malignant neoplasm of skin: Secondary | ICD-10-CM | POA: Diagnosis not present

## 2021-12-21 ENCOUNTER — Other Ambulatory Visit: Payer: Self-pay | Admitting: Surgery

## 2021-12-21 DIAGNOSIS — K8020 Calculus of gallbladder without cholecystitis without obstruction: Secondary | ICD-10-CM

## 2021-12-28 ENCOUNTER — Ambulatory Visit
Admission: RE | Admit: 2021-12-28 | Discharge: 2021-12-28 | Disposition: A | Discharge status: Home / Self Care | Payer: 59 | Source: Ambulatory Visit | Attending: Internal Medicine | Admitting: Internal Medicine

## 2021-12-28 ENCOUNTER — Ambulatory Visit
Admission: RE | Admit: 2021-12-28 | Discharge: 2021-12-28 | Disposition: A | Discharge status: Home / Self Care | Payer: 59 | Source: Ambulatory Visit | Attending: Surgery | Admitting: Surgery

## 2021-12-28 DIAGNOSIS — Z1231 Encounter for screening mammogram for malignant neoplasm of breast: Secondary | ICD-10-CM | POA: Diagnosis not present

## 2021-12-28 DIAGNOSIS — K8020 Calculus of gallbladder without cholecystitis without obstruction: Secondary | ICD-10-CM | POA: Insufficient documentation

## 2021-12-28 DIAGNOSIS — R109 Unspecified abdominal pain: Secondary | ICD-10-CM | POA: Diagnosis not present

## 2022-01-20 ENCOUNTER — Other Ambulatory Visit: Payer: Self-pay | Admitting: Nurse Practitioner

## 2022-01-20 DIAGNOSIS — R1011 Right upper quadrant pain: Secondary | ICD-10-CM

## 2022-02-10 ENCOUNTER — Other Ambulatory Visit: Payer: Self-pay

## 2022-02-18 ENCOUNTER — Ambulatory Visit: Payer: BC Managed Care – PPO

## 2022-03-26 ENCOUNTER — Encounter (HOSPITAL_COMMUNITY)
Admission: RE | Admit: 2022-03-26 | Discharge: 2022-03-26 | Disposition: A | Discharge status: Home / Self Care | Payer: BC Managed Care – PPO | Source: Ambulatory Visit | Attending: Nurse Practitioner | Admitting: Nurse Practitioner

## 2022-03-26 DIAGNOSIS — R1011 Right upper quadrant pain: Secondary | ICD-10-CM | POA: Diagnosis not present

## 2022-03-26 MED ORDER — TECHNETIUM TC 99M MEBROFENIN IV KIT
5.1000 | PACK | Freq: Once | INTRAVENOUS | Status: AC | PRN
  Administered 2022-03-26: 5.1 via INTRAVENOUS

## 2022-04-04 ENCOUNTER — Telehealth: Payer: BC Managed Care – PPO | Admitting: Family

## 2022-04-04 DIAGNOSIS — J019 Acute sinusitis, unspecified: Secondary | ICD-10-CM

## 2022-04-04 MED ORDER — AMOXICILLIN-POT CLAVULANATE 875-125 MG PO TABS: 1.0000 | ORAL_TABLET | Freq: Two times a day (BID) | ORAL | 0 refills | Status: DC

## 2022-04-04 NOTE — Progress Notes (Signed)

## 2022-05-05 ENCOUNTER — Ambulatory Visit
Admission: RE | Admit: 2022-05-05 | Discharge: 2022-05-05 | Disposition: A | Discharge status: Home / Self Care | Payer: BC Managed Care – PPO | Source: Ambulatory Visit | Attending: Emergency Medicine | Admitting: Emergency Medicine

## 2022-05-05 VITALS — BP 130/70 | HR 103 | Temp 98.3°F | Resp 18 | Ht 62.0 in | Wt 106.0 lb

## 2022-05-05 DIAGNOSIS — J0100 Acute maxillary sinusitis, unspecified: Secondary | ICD-10-CM | POA: Diagnosis not present

## 2022-05-05 MED ORDER — CEFDINIR 300 MG PO CAPS: 300.0000 mg | ORAL_CAPSULE | Freq: Two times a day (BID) | ORAL | 0 refills | Status: AC

## 2022-05-05 MED ORDER — PREDNISONE 10 MG (21) PO TBPK: ORAL_TABLET | Freq: Every day | ORAL | 0 refills | Status: DC

## 2022-05-05 NOTE — ED Provider Notes (Signed)
Roderic Palau    CSN: [id number] Arrival date & time: 05/05/22  1607      History   Chief Complaint Chief Complaint  Patient presents with   Nasal Congestion    Possible sinus infection? - Entered by patient    HPI Stephanie Henderson is a 56 y.o. female.  Patient presents with 2-week history of sinus pressure, sinus congestion, postnasal drip, runny nose, mild cough.  She denies fever, shortness of breath, vomiting, diarrhea, or other symptoms.  Patient had an e-visit on 04/04/2022; diagnosed with acute sinusitis; treated with Augmentin.  Her symptoms improved but never completely cleared and then became worse again.  The history is provided by the patient and medical records.    Past Medical History:  Diagnosis Date   Anxiety    Fibroid    Gallstones    GERD (gastroesophageal reflux disease) Aug 2012   with esophagitis by EGD   Hematuria    Hx of colonic polyps August 2012   repeat due 2016,  5 polyps 2009, clear 2012 Gustavo Lah)   Menorrhagia    did not tolerate trial of ocps due to migraines   Migraine headache    Migraine syndrome    since age 58,  sporadic   Skin cancer     Patient Active Problem List   Diagnosis Date Noted   Gallstones 08/28/2021   History of syncope 06/24/2020   Abdominal pain 06/24/2020   Chronic SI joint pain 10/24/2019   Chronic bilateral low back pain without sciatica 10/24/2019   Insomnia 07/18/2019   B12 deficiency 07/18/2019   History of benign breast biopsy 07/17/2019   Reflux esophagitis 08/02/2018   Right shoulder pain 07/13/2018   Frozen shoulder syndrome 06/13/2018   Encounter for preventive health examination 05/27/2018   Raynaud phenomenon 05/28/2017   Generalized anxiety disorder 05/29/2016   S/P hysterectomy 05/29/2016   Headache 04/14/2016   History of mammogram 03/02/2011   Migraine syndrome    Hx of colonic polyps    GERD (gastroesophageal reflux disease) 12/16/2010    Past Surgical History:   Procedure Laterality Date   BREAST BIOPSY Right 2017   benign   COLONOSCOPY WITH PROPOFOL N/A 07/31/2018   Procedure: COLONOSCOPY WITH PROPOFOL;  Surgeon: Lollie Sails, MD;  Location: [facility] ENDOSCOPY;  Service: Endoscopy;  Laterality: N/A;   CYSTOSCOPY     ESOPHAGOGASTRODUODENOSCOPY (EGD) WITH PROPOFOL N/A 07/31/2018   Procedure: ESOPHAGOGASTRODUODENOSCOPY (EGD) WITH PROPOFOL;  Surgeon: Lollie Sails, MD;  Location: [facility] ENDOSCOPY;  Service: Endoscopy;  Laterality: N/A;   laparoscopy     VAGINAL HYSTERECTOMY  6/13   with morcellation    OB History     Gravida  3   Para  2   Term  2   Preterm      AB  1   Living  2      SAB  1   IAB      Ectopic      Multiple      Live Births  2            Home Medications    Prior to Admission medications   Medication Sig Start Date End Date Taking? Authorizing Provider  cefdinir (OMNICEF) 300 MG capsule Take 1 capsule (300 mg total) by mouth 2 (two) times daily for 10 days. 05/05/22 05/15/22 Yes Sharion Balloon, NP  predniSONE (STERAPRED UNI-PAK 21 TAB) 10 MG (21) TBPK tablet Take by mouth daily. As directed 05/05/22  Yes  Sharion Balloon, NP  cyclobenzaprine (FLEXERIL) 10 MG tablet Take 1 tablet (10 mg total) by mouth at bedtime. 08/19/20   Crecencio Mc, MD  hyoscyamine (ANASPAZ) 0.125 MG TBDP disintergrating tablet PLACE 1 TABLET UNDER THE TONGUE EVERY 6 HOURS AS NEEDED. Patient not taking: Reported on 09/14/2021 06/23/20 09/14/21  Crecencio Mc, MD  pantoprazole (PROTONIX) 40 MG tablet Take 1 tablet (40 mg total) by mouth daily. 08/25/21   McLean-Scocuzza, Nino Glow, MD  sucralfate (CARAFATE) 1 g tablet Take 1 tablet (1 g total) by mouth 3 (three) times daily as needed. 08/19/20   Crecencio Mc, MD  SUMAtriptan (IMITREX) 100 MG tablet Take 1 tablet (100 mg total) by mouth once for 1 dose. May repeat in 2 hours if needed Patient not taking: Reported on 09/14/2021 05/25/18 08/25/21  Crecencio Mc, MD    Family History Family  History  Problem Relation Age of Onset   Hyperlipidemia Mother    Mental illness Mother        depression   Dementia Mother    Colon polyps Mother    Irritable bowel syndrome Mother    Colon cancer Father 18   Depression Father    Mental illness Father    Diabetes Neg Hx    Heart disease Neg Hx    Breast cancer Neg Hx    Ovarian cancer Neg Hx    Pancreatic cancer Neg Hx    Stomach cancer Neg Hx    Liver disease Neg Hx     Social History Social History   Tobacco Use   Smoking status: Never   Smokeless tobacco: Never  Vaping Use   Vaping Use: Never used  Substance Use Topics   Alcohol use: Yes    Alcohol/week: 3.0 standard drinks of alcohol    Types: 3 Glasses of wine per week   Drug use: No     Allergies   Propoxyphene and Tetracyclines & related   Review of Systems Review of Systems  Constitutional:  Negative for chills and fever.  HENT:  Positive for congestion, postnasal drip, rhinorrhea and sinus pressure. Negative for ear pain and sore throat.   Respiratory:  Positive for cough. Negative for shortness of breath.   Cardiovascular:  Negative for chest pain and palpitations.  Gastrointestinal:  Negative for diarrhea and vomiting.  Skin:  Negative for color change and rash.  All other systems reviewed and are negative.    Physical Exam Triage Vital Signs ED Triage Vitals  Enc Vitals Group     BP      Pulse      Resp      Temp      Temp src      SpO2      Weight      Height      Head Circumference      Peak Flow      Pain Score      Pain Loc      Pain Edu?      Excl. in [place]?    No data found.  Updated Vital Signs BP 130/70   Pulse (!) 103   Temp 98.3 F (36.8 C)   Resp 18   Ht '5\' 2"'$  (1.575 m)   Wt 106 lb (48.1 kg)   LMP 02/20/2011   SpO2 99%   BMI 19.39 kg/m   Visual Acuity Right Eye Distance:   Left Eye Distance:   Bilateral Distance:    Right Eye  Near:   Left Eye Near:    Bilateral Near:     Physical Exam Vitals and  nursing note reviewed.  Constitutional:      General: She is not in acute distress.    Appearance: Normal appearance. She is well-developed. She is not ill-appearing.  HENT:     Right Ear: Tympanic membrane normal.     Left Ear: Tympanic membrane normal.     Nose: Congestion present.     Mouth/Throat:     Mouth: Mucous membranes are moist.     Pharynx: Oropharynx is clear.  Cardiovascular:     Rate and Rhythm: Normal rate and regular rhythm.     Heart sounds: Normal heart sounds.  Pulmonary:     Effort: Pulmonary effort is normal. No respiratory distress.     Breath sounds: Normal breath sounds.  Musculoskeletal:     Cervical back: Neck supple.  Skin:    General: Skin is warm and dry.  Neurological:     Mental Status: She is alert.  Psychiatric:        Mood and Affect: Mood normal.        Behavior: Behavior normal.      UC Treatments / Results  Labs (all labs ordered are listed, but only abnormal results are displayed) Labs Reviewed - No data to display  EKG   Radiology No results found.  Procedures Procedures (including critical care time)  Medications Ordered in UC Medications - No data to display  Initial Impression / Assessment and Plan / UC Course  I have reviewed the triage vital signs and the nursing notes.  Pertinent labs & imaging results that were available during my care of the patient were reviewed by me and considered in my medical decision making (see chart for details).   Acute sinusitis.  Patient has been symptomatic for 2 weeks.  Treating with cefdinir and prednisone.  Instructed patient to follow up with her PCP if her symptoms are not improving.  Education provided on sinusitis.  She agrees to plan of care.     Final Clinical Impressions(s) / UC Diagnoses   Final diagnoses:  Acute non-recurrent maxillary sinusitis     Discharge Instructions      Take the cefdinir and prednisone as directed.  Follow up with your primary care provider  if your symptoms are not improving.        ED Prescriptions     Medication Sig Dispense Auth. Provider   cefdinir (OMNICEF) 300 MG capsule Take 1 capsule (300 mg total) by mouth 2 (two) times daily for 10 days. 20 capsule Sharion Balloon, NP   predniSONE (STERAPRED UNI-PAK 21 TAB) 10 MG (21) TBPK tablet Take by mouth daily. As directed 21 tablet Sharion Balloon, NP      PDMP not reviewed this encounter.   Sharion Balloon, NP 05/05/22 1655

## 2022-05-05 NOTE — ED Triage Notes (Signed)
Patient to Urgent Care with complaints of nasal congestion x2 weeks, reports concerns for possible sinus infection. Reports sinus pain and pressure, some temps off 99.5. Intermittent productive cough.   Treated with Augmentin for sinus infection over thanksgiving and reports that symptoms never completely cleared.

## 2022-05-05 NOTE — Discharge Instructions (Addendum)
Take the cefdinir and prednisone as directed.  Follow up with your primary care provider if your symptoms are not improving.

## 2022-08-18 ENCOUNTER — Other Ambulatory Visit: Payer: Self-pay

## 2022-08-24 ENCOUNTER — Encounter: Payer: Self-pay | Admitting: Internal Medicine

## 2022-09-21 ENCOUNTER — Encounter: Payer: 59 | Admitting: Internal Medicine

## 2022-09-24 ENCOUNTER — Encounter: Payer: 59 | Admitting: Internal Medicine

## 2022-10-05 ENCOUNTER — Encounter: Payer: Self-pay | Admitting: Internal Medicine

## 2022-10-05 ENCOUNTER — Ambulatory Visit (INDEPENDENT_AMBULATORY_CARE_PROVIDER_SITE_OTHER): Payer: BC Managed Care – PPO | Admitting: Internal Medicine

## 2022-10-05 VITALS — BP 112/80 | HR 76 | Temp 98.2°F | Ht 62.0 in | Wt 115.2 lb

## 2022-10-05 DIAGNOSIS — R5383 Other fatigue: Secondary | ICD-10-CM | POA: Diagnosis not present

## 2022-10-05 DIAGNOSIS — Z1231 Encounter for screening mammogram for malignant neoplasm of breast: Secondary | ICD-10-CM

## 2022-10-05 DIAGNOSIS — R7301 Impaired fasting glucose: Secondary | ICD-10-CM

## 2022-10-05 DIAGNOSIS — E559 Vitamin D deficiency, unspecified: Secondary | ICD-10-CM

## 2022-10-05 DIAGNOSIS — Z0000 Encounter for general adult medical examination without abnormal findings: Secondary | ICD-10-CM | POA: Diagnosis not present

## 2022-10-05 DIAGNOSIS — E785 Hyperlipidemia, unspecified: Secondary | ICD-10-CM | POA: Diagnosis not present

## 2022-10-05 DIAGNOSIS — N951 Menopausal and female climacteric states: Secondary | ICD-10-CM

## 2022-10-05 MED ORDER — ALPRAZOLAM 0.5 MG PO TABS: 0.5000 mg | ORAL_TABLET | Freq: Every evening | ORAL | 2 refills | Status: DC | PRN

## 2022-10-05 MED ORDER — CYCLOBENZAPRINE HCL 10 MG PO TABS: 10.0000 mg | ORAL_TABLET | Freq: Every day | ORAL | 1 refills | Status: DC

## 2022-10-05 MED ORDER — PANTOPRAZOLE SODIUM 40 MG PO TBEC: 40.0000 mg | DELAYED_RELEASE_TABLET | Freq: Every day | ORAL | 3 refills | Status: DC

## 2022-10-05 MED ORDER — ESTRADIOL 0.5 MG PO TABS: 0.5000 mg | ORAL_TABLET | Freq: Every day | ORAL | 1 refills | Status: DC

## 2022-10-05 NOTE — Progress Notes (Unsigned)
Patient ID: Stephanie Henderson, female    DOB: 05-10-1966  Age: 57 y.o. MRN: [id number]  The patient is here for annual preventive examination and management of other chronic and acute problems.   The risk factors are reflected in the social history.   The roster of all physicians providing medical care to patient - is listed in the Snapshot section of the chart.   Activities of daily living:  The patient is 100% independent in all ADLs: dressing, toileting, feeding as well as independent mobility   Home safety : The patient has smoke detectors in the home. They wear seatbelts.  There are no unsecured firearms at home. There is no violence in the home.    There is no risks for hepatitis, STDs or HIV. There is no   history of blood transfusion. They have no travel history to infectious disease endemic areas of the world.   The patient has seen their dentist in the last six month. They have seen their eye doctor in the last year. The patinet  denies slight hearing difficulty with regard to whispered voices and some television programs.  They have deferred audiologic testing in the last year.  They do not  have excessive sun exposure. Discussed the need for sun protection: hats, long sleeves and use of sunscreen if there is significant sun exposure.    Diet: the importance of a healthy diet is discussed. They do have a healthy diet.   The benefits of regular aerobic exercise were discussed. The patient  exercises  3 to 5 days per week  for  60 minutes.    Depression screen: there are no signs or vegative symptoms of depression- irritability, change in appetite, anhedonia, sadness/tearfullness.   The following portions of the patient's history were reviewed and updated as appropriate: allergies, current medications, past family history, past medical history,  past surgical history, past social history  and problem list.   Visual acuity was not assessed per patient preference since the patient has  regular follow up with an  ophthalmologist. Hearing and body mass index were assessed and reviewed.    During the course of the visit the patient was educated and counseled about appropriate screening and preventive services including : fall prevention , diabetes screening, nutrition counseling, colorectal cancer screening, and recommended immunizations.    Chief Complaint:  HPI     Annual Exam    Additional comments: Physical       Last edited by Sandy Salaam, CMA on 10/05/2022  3:44 PM.        Review of Symptoms  Patient denies headache, fevers, malaise, unintentional weight loss, skin rash, eye pain, sinus congestion and sinus pain, sore throat, dysphagia,  hemoptysis , cough, dyspnea, wheezing, chest pain, palpitations, orthopnea, edema, abdominal pain, nausea, melena, diarrhea, constipation, flank pain, dysuria, hematuria, urinary  Frequency, nocturia, numbness, tingling, seizures,  Focal weakness, Loss of consciousness,  Tremor, insomnia, depression, anxiety, and suicidal ideation.    Physical Exam:  BP 112/80   Pulse 76   Temp 98.2 F (36.8 C) (Oral)   Ht 5\' 2"  (1.575 m)   Wt 115 lb 3.2 oz (52.3 kg)   LMP 02/20/2011   SpO2 98%   BMI 21.07 kg/m    Physical Exam  Assessment and Plan: Encounter for preventive health examination  Hyperlipidemia, unspecified hyperlipidemia type  Impaired fasting glucose  Other fatigue  Encounter for screening mammogram for malignant neoplasm of breast    No follow-ups on file.  Sherlene Shams, MD

## 2022-10-05 NOTE — Patient Instructions (Addendum)
The book I referred to today when we were discussing your  husband's food allergies is Dr Gordy Councilman Bennett's book "AIP Diet for Beginners: A Comprehensive Guide to the Autoimmune Paleo protocol"    Starting estrogen at  0.5 mg tablet daily  you may increase  dose to 1 mg daily after a few weeks If symptoms are not  controlled

## 2022-10-05 NOTE — Assessment & Plan Note (Signed)
She has not C/I to unopposed estrogen.  Starting estradiol

## 2022-10-06 LAB — CBC WITH DIFFERENTIAL/PLATELET
Basophils Absolute: 0 10*3/uL (ref 0.0–0.1)
Basophils Relative: 0.8 % (ref 0.0–3.0)
Eosinophils Absolute: 0.2 10*3/uL (ref 0.0–0.7)
Eosinophils Relative: 3 % (ref 0.0–5.0)
HCT: 38.1 % (ref 36.0–46.0)
Hemoglobin: 12.5 g/dL (ref 12.0–15.0)
Lymphocytes Relative: 45.5 % (ref 12.0–46.0)
Lymphs Abs: 2.9 10*3/uL (ref 0.7–4.0)
MCHC: 32.8 g/dL (ref 30.0–36.0)
MCV: 98.8 fl (ref 78.0–100.0)
Monocytes Absolute: 0.4 10*3/uL (ref 0.1–1.0)
Monocytes Relative: 5.8 % (ref 3.0–12.0)
Neutro Abs: 2.8 10*3/uL (ref 1.4–7.7)
Neutrophils Relative %: 44.9 % (ref 43.0–77.0)
Platelets: 232 10*3/uL (ref 150.0–400.0)
RBC: 3.85 Mil/uL — ABNORMAL LOW (ref 3.87–5.11)
RDW: 13 % (ref 11.5–15.5)
WBC: 6.3 10*3/uL (ref 4.0–10.5)

## 2022-10-06 LAB — LIPID PANEL
Cholesterol: 196 mg/dL (ref 0–200)
HDL: 61.9 mg/dL (ref >39.00)
LDL Cholesterol: 125 mg/dL — ABNORMAL HIGH (ref 0–99)
NonHDL: 133.7
Total CHOL/HDL Ratio: 3
Triglycerides: 46 mg/dL (ref 0.0–149.0)
VLDL: 9.2 mg/dL (ref 0.0–40.0)

## 2022-10-06 LAB — COMPREHENSIVE METABOLIC PANEL
ALT: 19 U/L (ref 0–35)
AST: 20 U/L (ref 0–37)
Albumin: 4.5 g/dL (ref 3.5–5.2)
Alkaline Phosphatase: 40 U/L (ref 39–117)
BUN: 19 mg/dL (ref 6–23)
CO2: 33 mEq/L — ABNORMAL HIGH (ref 19–32)
Calcium: 9.8 mg/dL (ref 8.4–10.5)
Chloride: 100 mEq/L (ref 96–112)
Creatinine, Ser: 0.77 mg/dL (ref 0.40–1.20)
GFR: 86.94 mL/min (ref >60.00)
Glucose, Bld: 87 mg/dL (ref 70–99)
Potassium: 3.9 mEq/L (ref 3.5–5.1)
Sodium: 137 mEq/L (ref 135–145)
Total Bilirubin: 0.3 mg/dL (ref 0.2–1.2)
Total Protein: 6.7 g/dL (ref 6.0–8.3)

## 2022-10-06 LAB — TSH: TSH: 1.56 u[IU]/mL (ref 0.35–5.50)

## 2022-10-06 LAB — VITAMIN D 25 HYDROXY (VIT D DEFICIENCY, FRACTURES): VITD: 42.45 ng/mL (ref 30.00–100.00)

## 2022-10-06 LAB — LDL CHOLESTEROL, DIRECT: Direct LDL: 124 mg/dL

## 2022-10-06 NOTE — Assessment & Plan Note (Signed)

## 2022-12-30 ENCOUNTER — Ambulatory Visit
Admission: RE | Admit: 2022-12-30 | Discharge: 2022-12-30 | Disposition: A | Discharge status: Home / Self Care | Payer: BC Managed Care – PPO | Source: Ambulatory Visit | Attending: Internal Medicine | Admitting: Internal Medicine

## 2022-12-30 DIAGNOSIS — Z1231 Encounter for screening mammogram for malignant neoplasm of breast: Secondary | ICD-10-CM

## 2023-02-09 ENCOUNTER — Ambulatory Visit: Payer: BC Managed Care – PPO | Admitting: Internal Medicine

## 2023-03-08 ENCOUNTER — Ambulatory Visit: Payer: Self-pay | Admitting: Podiatry

## 2023-03-22 ENCOUNTER — Ambulatory Visit: Payer: BC Managed Care – PPO

## 2023-03-22 ENCOUNTER — Encounter: Payer: Self-pay | Admitting: Podiatry

## 2023-03-22 ENCOUNTER — Ambulatory Visit: Payer: BC Managed Care – PPO | Admitting: Podiatry

## 2023-03-22 VITALS — BP 127/75 | HR 104

## 2023-03-22 DIAGNOSIS — M2012 Hallux valgus (acquired), left foot: Secondary | ICD-10-CM | POA: Diagnosis not present

## 2023-03-22 NOTE — Progress Notes (Signed)
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 T - none    Subjective: 57 y.o. female presents today as a new patient for evaluation of a symptomatic bunion to the left foot progressively becoming more symptomatic over the last few years.  It has progressively begin affecting her daily quality of life.  She has tried different shoe gear modifications with no alleviation of her symptoms.  She presents today to discuss her treatment options  Past Medical History:  Diagnosis Date   Anxiety    Fibroid    Gallstones    GERD (gastroesophageal reflux disease) Aug 2012   with esophagitis by EGD   Hematuria    Hx of colonic polyps August 2012   repeat due 2016,  5 polyps 2009, clear 2012 ([place])   Menorrhagia    did not tolerate trial of ocps due to migraines   Migraine headache    Migraine syndrome    since age 39,  sporadic   Skin cancer     Past Surgical History:  Procedure Laterality Date   BREAST BIOPSY Right 2017   benign   COLONOSCOPY WITH PROPOFOL N/A 07/31/2018   Procedure: COLONOSCOPY WITH PROPOFOL;  Surgeon: Christena Deem, MD;  Location: [facility] ENDOSCOPY;  Service: Endoscopy;  Laterality: N/A;   CYSTOSCOPY     ESOPHAGOGASTRODUODENOSCOPY (EGD) WITH PROPOFOL N/A 07/31/2018   Procedure: ESOPHAGOGASTRODUODENOSCOPY (EGD) WITH PROPOFOL;  Surgeon: Christena Deem, MD;  Location: [facility] ENDOSCOPY;  Service: Endoscopy;  Laterality: N/A;   laparoscopy     VAGINAL HYSTERECTOMY  6/13   with morcellation    Allergies  Allergen Reactions   Propoxyphene     Other reaction(s): Other (See Comments) Unknown   Tetracyclines & Related Diarrhea and Nausea Only     Objective: Physical Exam General: The patient is alert and oriented x3 in no acute distress.  Dermatology: Skin  is cool, dry and supple bilateral lower extremities. Negative for open lesions or macerations.  Vascular: Palpable pedal pulses bilaterally. No edema or erythema noted. Capillary refill within normal limits.  Neurological: Epicritic and protective threshold grossly intact bilaterally.   Musculoskeletal Exam: Clinical evidence of bunion deformity noted to the respective foot. There is moderate pain on palpation range of motion of the first MPJ. Lateral deviation of the hallux noted consistent with hallux abductovalgus.  Radiographic Exam LT foot 03/22/2023: Normal osseous mineralization.  No acute fractures identified.  Increased intermetatarsal angle with an increased hallux abductus angle noted on AP view. Assessment: 1.  Hallux valgus LT foot; moderately symptomatic.  Seems   Plan of Care:  -Patient was evaluated. X-Rays reviewed. -Today we discussed different treatment options regarding hallux valgus deformity including conservative management versus surgical intervention.  Conservatively recommend good supportive shoes and sneakers which the patient is already doing.  Also recommend padding PRN -Patient already also takes anti-inflammatories as needed.  Continue -Surgically different surgical approaches were discussed including Lapidus bunionectomy as well as distal osteotomies.  I do believe the patient would benefit from a distal osteotomy for moderate bunion deformity.  The procedure was discussed in detail as well as the postoperative protocol.  All patient questions answered.  No guarantees were expressed or implied -Return to clinic as needed  *  School nurse at [employer]   Felecia Shelling, DPM [facility]  Dr. Felecia Shelling, DPM    [address] [place], [place] [address]                Office [phone]  Fax [phone]

## 2023-07-01 ENCOUNTER — Ambulatory Visit: Payer: 59 | Admitting: Internal Medicine

## 2023-08-09 ENCOUNTER — Encounter: Payer: Self-pay | Admitting: Internal Medicine

## 2023-08-09 ENCOUNTER — Ambulatory Visit (INDEPENDENT_AMBULATORY_CARE_PROVIDER_SITE_OTHER): Payer: 59 | Admitting: Internal Medicine

## 2023-08-09 VITALS — BP 118/58 | HR 114 | Ht 62.0 in | Wt 113.6 lb

## 2023-08-09 DIAGNOSIS — M130 Polyarthritis, unspecified: Secondary | ICD-10-CM | POA: Diagnosis not present

## 2023-08-09 DIAGNOSIS — G8929 Other chronic pain: Secondary | ICD-10-CM | POA: Insufficient documentation

## 2023-08-09 DIAGNOSIS — M25551 Pain in right hip: Secondary | ICD-10-CM | POA: Diagnosis not present

## 2023-08-09 DIAGNOSIS — M545 Low back pain, unspecified: Secondary | ICD-10-CM | POA: Diagnosis not present

## 2023-08-09 DIAGNOSIS — Z79899 Other long term (current) drug therapy: Secondary | ICD-10-CM

## 2023-08-09 MED ORDER — CELECOXIB 200 MG PO CAPS: 200.0000 mg | ORAL_CAPSULE | Freq: Two times a day (BID) | ORAL | 2 refills | Status: DC

## 2023-08-09 NOTE — Assessment & Plan Note (Signed)
 Exam suggestive of bursitis.  Celebres prescribed and PPI for prophylaxis RTC one month

## 2023-08-09 NOTE — Patient Instructions (Addendum)
 I am treating you for trochanteric bursitis with a stronger NSAID.   Stop the aleve,  use the celebrex instead 1- 2 times daily  You can add up to 2000 mg of acetominophen (tylenol) every day safely  In divided doses   If the pain returns after one month,  I recommend seeing orthopedics

## 2023-08-09 NOTE — Progress Notes (Signed)
 Subjective:  Patient ID: Stephanie Henderson, female    DOB: January 26, 1966  Age: 58 y.o. MRN: [id number]  CC: The primary encounter diagnosis was Polyarthritis. Diagnoses of Long-term use of high-risk medication, Chronic right hip pain, and Chronic bilateral low back pain without sciatica were also pertinent to this visit.   HPI Westyn Keatley presents for  Chief Complaint  Patient presents with   Pain    Low back pain and right hip pain   Joint pain:  1) Last week right hand PIP fingers 2 and 3  became swollen red and painful without any precedent trauma or overuse,  the pain and swelling lasted  4 days then resolved   2) Right lateral hip pain began Before Christmas felt a "twinge and ache in the lateral hip and radiates all the way to the ankle. Aggravated by walking and hot yoga stretching , PAIN WITH lying on right hip . Iliac crest also hurts  Lower back/SI joints  and tailbone hurting in certain sitting positions,  persistent .  Worse with sudden position change  and with prlonged inactivity  .    Remote skateboard fall onto tailbone as a teenager,  but no known  history of fractures or falls.     Outpatient Medications Prior to Visit  Medication Sig Dispense Refill   ALPRAZolam (XANAX) 0.5 MG tablet Take 1 tablet (0.5 mg total) by mouth at bedtime as needed. 30 tablet 2   cyclobenzaprine (FLEXERIL) 10 MG tablet Take 1 tablet (10 mg total) by mouth at bedtime. 90 tablet 1   pantoprazole (PROTONIX) 40 MG tablet Take 1 tablet (40 mg total) by mouth daily. 90 tablet 3   estradiol (ESTRACE) 0.5 MG tablet Take 1 tablet (0.5 mg total) by mouth daily. (Patient not taking: Reported on 08/09/2023) 90 tablet 1   No facility-administered medications prior to visit.    Review of Systems;  Patient denies headache, fevers, malaise, unintentional weight loss, skin rash, eye pain, sinus congestion and sinus pain, sore throat, dysphagia,  hemoptysis , cough, dyspnea, wheezing, chest pain,  palpitations, orthopnea, edema, abdominal pain, nausea, melena, diarrhea, constipation, flank pain, dysuria, hematuria, urinary  Frequency, nocturia, numbness, tingling, seizures,  Focal weakness, Loss of consciousness,  Tremor, insomnia, depression, anxiety, and suicidal ideation.      Objective:  BP (!) 118/58   Pulse (!) 114   Ht 5\' 2"  (1.575 m)   Wt 113 lb 9.6 oz (51.5 kg)   LMP 02/20/2011   SpO2 98%   BMI 20.78 kg/m   BP Readings from Last 3 Encounters:  08/09/23 (!) 118/58  03/22/23 127/75  10/05/22 112/80    Wt Readings from Last 3 Encounters:  08/09/23 113 lb 9.6 oz (51.5 kg)  10/05/22 115 lb 3.2 oz (52.3 kg)  05/05/22 106 lb (48.1 kg)    Physical Exam Vitals reviewed.  Constitutional:      General: She is not in acute distress.    Appearance: Normal appearance. She is normal weight. She is not ill-appearing, toxic-appearing or diaphoretic.  HENT:     Head: Normocephalic.  Eyes:     General: No scleral icterus.       Right eye: No discharge.        Left eye: No discharge.     Conjunctiva/sclera: Conjunctivae normal.  Cardiovascular:     Rate and Rhythm: Normal rate and regular rhythm.     Heart sounds: Normal heart sounds.  Pulmonary:     Effort: Pulmonary  effort is normal. No respiratory distress.     Breath sounds: Normal breath sounds.  Musculoskeletal:        General: Tenderness present. Normal range of motion.     Lumbar back: Tenderness and bony tenderness present. Negative right straight leg raise test and negative left straight leg raise test.       Back:  Skin:    General: Skin is warm and dry.  Neurological:     General: No focal deficit present.     Mental Status: She is alert and oriented to person, place, and time. Mental status is at baseline.  Psychiatric:        Mood and Affect: Mood normal.        Behavior: Behavior normal.        Thought Content: Thought content normal.        Judgment: Judgment normal.    Lab Results  Component  Value Date   HGBA1C 5.4 11/16/2018   HGBA1C 5.8 05/25/2018   HGBA1C 5.6 05/25/2017    Lab Results  Component Value Date   CREATININE 0.77 10/05/2022   CREATININE 0.57 08/25/2021   CREATININE 0.60 08/19/2020    Lab Results  Component Value Date   WBC 6.3 10/05/2022   HGB 12.5 10/05/2022   HCT 38.1 10/05/2022   PLT 232.0 10/05/2022   GLUCOSE 87 10/05/2022   CHOL 196 10/05/2022   TRIG 46.0 10/05/2022   HDL 61.90 10/05/2022   LDLDIRECT 124.0 10/05/2022   LDLCALC 125 (H) 10/05/2022   ALT 19 10/05/2022   AST 20 10/05/2022   NA 137 10/05/2022   K 3.9 10/05/2022   CL 100 10/05/2022   CREATININE 0.77 10/05/2022   BUN 19 10/05/2022   CO2 33 (H) 10/05/2022   TSH 1.56 10/05/2022   HGBA1C 5.4 11/16/2018    MM 3D SCREENING MAMMOGRAM BILATERAL BREAST Result Date: 12/31/2022 CLINICAL DATA:  Screening. EXAM: DIGITAL SCREENING BILATERAL MAMMOGRAM WITH TOMOSYNTHESIS AND CAD TECHNIQUE: Bilateral screening digital craniocaudal and mediolateral oblique mammograms were obtained. Bilateral screening digital breast tomosynthesis was performed. The images were evaluated with computer-aided detection. COMPARISON:  Previous exam(s). ACR Breast Density Category d: The breasts are extremely dense, which lowers the sensitivity of mammography. FINDINGS: There are no findings suspicious for malignancy. IMPRESSION: No mammographic evidence of malignancy. A result letter of this screening mammogram will be mailed directly to the patient. RECOMMENDATION: Screening mammogram in one year. (Code:SM-B-01Y) BI-RADS CATEGORY  1: Negative. Electronically Signed   By: Sherian Rein M.D.   On: 12/31/2022 14:38    Assessment & Plan:  .Polyarthritis Assessment & Plan: Involving multiple PIP/Screening  serologies for autoimune /inflammatory states.  Celebrex ordered   Orders: -     Sedimentation rate -     C-reactive protein -     Uric acid -     Comprehensive metabolic panel -     CBC with  Differential/Platelet -     ANA w/Reflex if Positive  Long-term use of high-risk medication -     B12 and Folate Panel  Chronic right hip pain Assessment & Plan: Exam suggestive of bursitis.  Celebres prescribed and PPI for prophylaxis RTC one month    Chronic bilateral low back pain without sciatica Assessment & Plan: Plain films of lumbar spine and sacrum  in 2021ordered today noted mild degenerative changes  without disk space narrowing    Other orders -     Celecoxib; Take 1 capsule (200 mg total) by mouth 2 (two) times  daily.  Dispense: 60 capsule; Refill: 2    Follow-up: No follow-ups on file.   Sherlene Shams, MD

## 2023-08-09 NOTE — Assessment & Plan Note (Addendum)
 Involving multiple PIP/Screening  serologies for autoimune /inflammatory states.  Celebrex ordered

## 2023-08-09 NOTE — Assessment & Plan Note (Signed)
 Plain films of lumbar spine and sacrum  in 2021ordered today noted mild degenerative changes  without disk space narrowing

## 2023-08-10 LAB — COMPREHENSIVE METABOLIC PANEL WITH GFR
ALT: 19 U/L (ref 0–35)
AST: 21 U/L (ref 0–37)
Albumin: 4.8 g/dL (ref 3.5–5.2)
Alkaline Phosphatase: 42 U/L (ref 39–117)
BUN: 18 mg/dL (ref 6–23)
CO2: 31 meq/L (ref 19–32)
Calcium: 9.7 mg/dL (ref 8.4–10.5)
Chloride: 102 meq/L (ref 96–112)
Creatinine, Ser: 0.6 mg/dL (ref 0.40–1.20)
GFR: 100.57 mL/min (ref >60.00)
Glucose, Bld: 93 mg/dL (ref 70–99)
Potassium: 4.1 meq/L (ref 3.5–5.1)
Sodium: 138 meq/L (ref 135–145)
Total Bilirubin: 0.2 mg/dL (ref 0.2–1.2)
Total Protein: 7 g/dL (ref 6.0–8.3)

## 2023-08-10 LAB — CBC WITH DIFFERENTIAL/PLATELET
Basophils Absolute: 0 10*3/uL (ref 0.0–0.1)
Basophils Relative: 0.8 % (ref 0.0–3.0)
Eosinophils Absolute: 0.1 10*3/uL (ref 0.0–0.7)
Eosinophils Relative: 1.5 % (ref 0.0–5.0)
HCT: 38.8 % (ref 36.0–46.0)
Hemoglobin: 12.8 g/dL (ref 12.0–15.0)
Lymphocytes Relative: 44.4 % (ref 12.0–46.0)
Lymphs Abs: 2.5 10*3/uL (ref 0.7–4.0)
MCHC: 33.1 g/dL (ref 30.0–36.0)
MCV: 98.6 fl (ref 78.0–100.0)
Monocytes Absolute: 0.3 10*3/uL (ref 0.1–1.0)
Monocytes Relative: 5.6 % (ref 3.0–12.0)
Neutro Abs: 2.7 10*3/uL (ref 1.4–7.7)
Neutrophils Relative %: 47.7 % (ref 43.0–77.0)
Platelets: 224 10*3/uL (ref 150.0–400.0)
RBC: 3.93 Mil/uL (ref 3.87–5.11)
RDW: 13.3 % (ref 11.5–15.5)
WBC: 5.7 10*3/uL (ref 4.0–10.5)

## 2023-08-10 LAB — SEDIMENTATION RATE: Sed Rate: 4 mm/h (ref 0–30)

## 2023-08-10 LAB — C-REACTIVE PROTEIN: CRP: <1 mg/dL (ref 0.5–20.0)

## 2023-08-10 LAB — ANA W/REFLEX IF POSITIVE: Anti Nuclear Antibody (ANA): NEGATIVE

## 2023-08-10 LAB — URIC ACID: Uric Acid, Serum: 3.1 mg/dL (ref 2.4–7.0)

## 2023-08-11 LAB — B12 AND FOLATE PANEL
Folate: 20.9 ng/mL (ref >5.9)
Vitamin B-12: 319 pg/mL (ref 211–911)

## 2023-08-12 ENCOUNTER — Encounter: Payer: Self-pay | Admitting: Internal Medicine

## 2023-08-24 ENCOUNTER — Ambulatory Visit: Admitting: Family Medicine

## 2023-08-24 VITALS — BP 126/68 | Ht 62.0 in | Wt 110.0 lb

## 2023-08-24 DIAGNOSIS — M5441 Lumbago with sciatica, right side: Secondary | ICD-10-CM | POA: Diagnosis not present

## 2023-08-24 NOTE — Progress Notes (Signed)
 PCP: Sherlene Shams, MD  Subjective:   HPI: Patient is a 58 y.o. female here for right hip, tailbone pain.  Patient reports she developed lateral right hip pain back in December. No acute injury, just felt a twinge lateral right hip when walking. This went away but a week later started to get achiness. Radiates down to the foot intermittently. Also wakes her up sometimes. Worse lying on this side at times. No numbness/tingling. Also noticed since January sitting is uncomfortable especially if she sits back a little bit with pain in midline. History of SI soreness a couple years ago. Tried aleve, exercises.  Past Medical History:  Diagnosis Date   Anxiety    Fibroid    Gallstones    GERD (gastroesophageal reflux disease) Aug 2012   with esophagitis by EGD   Hematuria    Hx of colonic polyps August 2012   repeat due 2016,  5 polyps 2009, clear 2012 ([place])   Menorrhagia    did not tolerate trial of ocps due to migraines   Migraine headache    Migraine syndrome    since age 48,  sporadic   Skin cancer     Current Outpatient Medications on File Prior to Visit  Medication Sig Dispense Refill   ALPRAZolam (XANAX) 0.5 MG tablet Take 1 tablet (0.5 mg total) by mouth at bedtime as needed. 30 tablet 2   celecoxib (CELEBREX) 200 MG capsule Take 1 capsule (200 mg total) by mouth 2 (two) times daily. 60 capsule 2   cyclobenzaprine (FLEXERIL) 10 MG tablet Take 1 tablet (10 mg total) by mouth at bedtime. 90 tablet 1   estradiol (ESTRACE) 0.5 MG tablet Take 1 tablet (0.5 mg total) by mouth daily. (Patient not taking: Reported on 08/09/2023) 90 tablet 1   pantoprazole (PROTONIX) 40 MG tablet Take 1 tablet (40 mg total) by mouth daily. 90 tablet 3   No current facility-administered medications on file prior to visit.    Past Surgical History:  Procedure Laterality Date   BREAST BIOPSY Right 2017   benign   COLONOSCOPY WITH PROPOFOL N/A 07/31/2018   Procedure: COLONOSCOPY WITH  PROPOFOL;  Surgeon: Christena Deem, MD;  Location: [facility] ENDOSCOPY;  Service: Endoscopy;  Laterality: N/A;   CYSTOSCOPY     ESOPHAGOGASTRODUODENOSCOPY (EGD) WITH PROPOFOL N/A 07/31/2018   Procedure: ESOPHAGOGASTRODUODENOSCOPY (EGD) WITH PROPOFOL;  Surgeon: Christena Deem, MD;  Location[facility] ENDOSCOPY;  Service: Endoscopy;  Laterality: N/A;   laparoscopy     VAGINAL HYSTERECTOMY  6/13   with morcellation    Allergies  Allergen Reactions   Propoxyphene     Other reaction(s): Other (See Comments) Unknown   Tetracyclines & Related Diarrhea and Nausea Only    BP 126/68   Ht 5\' 2"  (1.575 m)   Wt 110 lb (49.9 kg)   LMP 02/20/2011   BMI 20.12 kg/m       No data to display              No data to display              Objective:  Physical Exam:  Gen: NAD, comfortable in exam room  Right hip: No deformity. Full range of motion with 5/5 strength except 5-/5 hip abduction Mild tenderness to palpation greater trochanter.  No other tenderness including SI joint, midline over sacrum, coccyx, lumbar spine. Neurovascularly intact distally. Negative logroll Negative faber, fadir, and piriformis stretches.  Back: No gross deformity, scoliosis. No paraspinal TTP.  No midline or bony TTP. FROM. Strength LEs 5/5 all muscle groups.   2+ MSRs in patellar and achilles tendons, equal bilaterally. Negative SLRs. Sensation intact to light touch bilaterally.   Assessment & Plan:  1. Back/right hip pain - history suggestive of mild lumbar radiculopathy with some element of greater trochanteric pain syndrome.  Home exercises reviewed.  Aleve for 7 days then as needed.  Consider formal physical therapy.  Follow up in 5-6 weeks.

## 2023-08-24 NOTE — Patient Instructions (Signed)
 Your exam is reassuring. I suspect you have a slightly irritated nerve in your low back from a disc bulge/small herniation.  This would account for all your symptoms. It should improve with conservative measures though. Aleve 2 tabs twice a day with food for pain and inflammation for 7 days then as needed.  Consider meloxicam instead. Do home exercises daily as directed.  Call if you're not improving with these and we can put in an order for formal physical therapy. Follow up with me in 5-6 weeks for reevaluation.

## 2023-10-07 ENCOUNTER — Encounter: Payer: 59 | Admitting: Internal Medicine

## 2023-10-12 ENCOUNTER — Encounter: Payer: Self-pay | Admitting: Internal Medicine

## 2023-10-12 ENCOUNTER — Ambulatory Visit: Admitting: Internal Medicine

## 2023-10-12 VITALS — BP 96/62 | HR 76 | Ht 62.0 in | Wt 115.4 lb

## 2023-10-12 DIAGNOSIS — E782 Mixed hyperlipidemia: Secondary | ICD-10-CM

## 2023-10-12 DIAGNOSIS — Z1211 Encounter for screening for malignant neoplasm of colon: Secondary | ICD-10-CM | POA: Diagnosis not present

## 2023-10-12 DIAGNOSIS — Z1231 Encounter for screening mammogram for malignant neoplasm of breast: Secondary | ICD-10-CM | POA: Diagnosis not present

## 2023-10-12 DIAGNOSIS — Z818 Family history of other mental and behavioral disorders: Secondary | ICD-10-CM

## 2023-10-12 DIAGNOSIS — Z0000 Encounter for general adult medical examination without abnormal findings: Secondary | ICD-10-CM

## 2023-10-12 DIAGNOSIS — K582 Mixed irritable bowel syndrome: Secondary | ICD-10-CM

## 2023-10-12 DIAGNOSIS — G4700 Insomnia, unspecified: Secondary | ICD-10-CM | POA: Diagnosis not present

## 2023-10-12 MED ORDER — ALPRAZOLAM 0.5 MG PO TABS: 0.5000 mg | ORAL_TABLET | Freq: Every evening | ORAL | 0 refills | Status: AC | PRN

## 2023-10-12 MED ORDER — PANTOPRAZOLE SODIUM 40 MG PO TBEC: 40.0000 mg | DELAYED_RELEASE_TABLET | Freq: Every day | ORAL | 3 refills | Status: AC

## 2023-10-12 MED ORDER — CYCLOBENZAPRINE HCL 10 MG PO TABS: 10.0000 mg | ORAL_TABLET | Freq: Every day | ORAL | 1 refills | Status: DC

## 2023-10-12 NOTE — Progress Notes (Unsigned)
 Patient ID: Stephanie Henderson, female    DOB: 09/13/1965  Age: 58 y.o. MRN: [id number]  The patient is here for annual preventive examination and management of other chronic and acute problems.   The risk factors are reflected in the social history.   The roster of all physicians providing medical care to patient - is listed in the Snapshot section of the chart.   Activities of daily living:  The patient is 100% independent in all ADLs: dressing, toileting, feeding as well as independent mobility   Home safety : The patient has smoke detectors in the home. They wear seatbelts.  There are no unsecured firearms at home. There is no violence in the home.    There is no risks for hepatitis, STDs or HIV. There is no   history of blood transfusion. They have no travel history to infectious disease endemic areas of the world.   The patient has seen their dentist in the last six month. They have seen their eye doctor in the last year. The patinet  denies slight hearing difficulty with regard to whispered voices and some television programs.  They have deferred audiologic testing in the last year.  They do not  have excessive sun exposure. Discussed the need for sun protection: hats, long sleeves and use of sunscreen if there is significant sun exposure.    Diet: the importance of a healthy diet is discussed. They do have a healthy diet.   The benefits of regular aerobic exercise were discussed. The patient  exercises  3 to 5 days per week  for  60 minutes.    Depression screen: there are no signs or vegative symptoms of depression- irritability, change in appetite, anhedonia, sadness/tearfullness.   The following portions of the patient's history were reviewed and updated as appropriate: allergies, current medications, past family history, past medical history,  past surgical history, past social history  and problem list.   Visual acuity was not assessed per patient preference since the patient has  regular follow up with an  ophthalmologist. Hearing and body mass index were assessed and reviewed.    During the course of the visit the patient was educated and counseled about appropriate screening and preventive services including : fall prevention , diabetes screening, nutrition counseling, colorectal cancer screening, and recommended immunizations.    Chief Complaint:   IBS  Tailbone pain  getting pelvic floor therapy      Review of Symptoms  Patient denies headache, fevers, malaise, unintentional weight loss, skin rash, eye pain, sinus congestion and sinus pain, sore throat, dysphagia,  hemoptysis , cough, dyspnea, wheezing, chest pain, palpitations, orthopnea, edema, abdominal pain, nausea, melena, diarrhea, constipation, flank pain, dysuria, hematuria, urinary  Frequency, nocturia, numbness, tingling, seizures,  Focal weakness, Loss of consciousness,  Tremor, insomnia, depression, anxiety, and suicidal ideation.    Physical Exam:  BP 96/62   Pulse 76   Ht 5\' 2"  (1.575 m)   Wt 115 lb 6.4 oz (52.3 kg)   LMP 02/20/2011   SpO2 98%   BMI 21.11 kg/m    Physical Exam Vitals reviewed.  Constitutional:      General: She is not in acute distress.    Appearance: Normal appearance. She is well-developed and normal weight. She is not ill-appearing, toxic-appearing or diaphoretic.  HENT:     Head: Normocephalic.     Right Ear: Tympanic membrane, ear canal and external ear normal. There is no impacted cerumen.     Left Ear: Tympanic  membrane, ear canal and external ear normal. There is no impacted cerumen.     Nose: Nose normal.     Mouth/Throat:     Mouth: Mucous membranes are moist.     Pharynx: Oropharynx is clear.  Eyes:     General: No scleral icterus.       Right eye: No discharge.        Left eye: No discharge.     Conjunctiva/sclera: Conjunctivae normal.     Pupils: Pupils are equal, round, and reactive to light.  Neck:     Thyroid : No thyromegaly.     Vascular: No  carotid bruit or JVD.  Cardiovascular:     Rate and Rhythm: Normal rate and regular rhythm.     Heart sounds: Normal heart sounds.  Pulmonary:     Effort: Pulmonary effort is normal. No respiratory distress.     Breath sounds: Normal breath sounds.  Chest:  Breasts:    Breasts are symmetrical.     Right: Normal. No swelling, inverted nipple, mass, nipple discharge, skin change or tenderness.     Left: Normal. No swelling, inverted nipple, mass, nipple discharge, skin change or tenderness.  Abdominal:     General: Bowel sounds are normal.     Palpations: Abdomen is soft. There is no mass.     Tenderness: There is no abdominal tenderness. There is no guarding or rebound.  Musculoskeletal:        General: Normal range of motion.     Cervical back: Normal range of motion and neck supple.  Lymphadenopathy:     Cervical: No cervical adenopathy.     Upper Body:     Right upper body: No supraclavicular, axillary or pectoral adenopathy.     Left upper body: No supraclavicular, axillary or pectoral adenopathy.  Skin:    General: Skin is warm and dry.  Neurological:     General: No focal deficit present.     Mental Status: She is alert and oriented to person, place, and time. Mental status is at baseline.  Psychiatric:        Mood and Affect: Mood normal.        Behavior: Behavior normal.        Thought Content: Thought content normal.        Judgment: Judgment normal.    Assessment and Plan: Colon cancer screening -     Ambulatory referral to Gastroenterology  Encounter for screening mammogram for malignant neoplasm of breast -     3D Screening Mammogram, Left and Right; Future  Moderate mixed hyperlipidemia not requiring statin therapy -     TSH -     Lipid Panel w/reflex Direct LDL  Encounter for preventive health examination Assessment & Plan: age appropriate education and counseling updated, referrals for preventative services and immunizations addressed, dietary and  smoking counseling addressed, most recent labs reviewed.  I have personally reviewed and have noted:   1) the patient's medical and social history 2) The pt's use of alcohol, tobacco, and illicit drugs 3) The patient's current medications and supplements 4) Functional ability including ADL's, fall risk, home safety risk, hearing and visual impairment 5) Diet and physical activities 6) Evidence for depression or mood disorder 7) The patient's height, weight, and BMI have been recorded in the chart  I have made referrals, and provided counseling and education based on review of the above    Irritable bowel syndrome with both constipation and diarrhea Assessment & Plan: Primary symptoms  are bloating and abdominal pain .  Advised to try IBGARD   Family history of dementia Assessment & Plan: She is concerned about her risk of dementia because of her family history..   Irecommend that she read "the End of Alzheimer's: The First Program to Prevent and Reverse Cognitive Decline"  by Fonnie Iba, MD     Insomnia, unspecified type Assessment & Plan: Discussed natural remedies for insomnia including herbal tea and melatonin.  Reviewd principles of good sleep hygiene,  advised to consider trial of Relaxium   Other orders -     Pantoprazole  Sodium; Take 1 tablet (40 mg total) by mouth daily.  Dispense: 90 tablet; Refill: 3 -     Cyclobenzaprine  HCl; Take 1 tablet (10 mg total) by mouth at bedtime.  Dispense: 90 tablet; Refill: 1 -     ALPRAZolam ; Take 1 tablet (0.5 mg total) by mouth at bedtime as needed.  Dispense: 30 tablet; Refill: 0    Return in about 6 months (around 04/13/2024).  Thersia Flax, MD

## 2023-10-12 NOTE — Patient Instructions (Addendum)
 For the concerns about dementia: I highly recommend reading "the End of Alzheimer's: The First Program to Prevent and Reverse Cognitive Decline"  by Fonnie Iba, MD    For the insomina:  You might want to try using Relaxium (as seen on TV commercials) . It is available through [employer] and contains all natural supplements:  Melatonin 5 mg  Chamomile 25 mg Passionflower extract 75 mg GABA 100 mg Ashwaganda extract 125 mg Magnesium citrate, glycinate, oxide (100 mg)  L tryptophan 500 mg Valerest (proprietary  ingredient ; probably valeria root extract)    For the IBS Try IBGARD  for the IBS (available on [employer] too.  Concentrated peppermint in a capsule form,  helpful for abd pain  For the cholesterol: Please consider a trial of red Yeast Rice as a natural remedy, along with a low glycemic index diet,  For your borderline elevated cholesterol.  it  does lower cholesterol, so if you want to try it , the dose is 600 mg twice daily in capsule form, available OTC.  It does require monitoring of liver enzymes,  Just like the statins,  So if you decide to start it,  I would like you to return in 6 weeks for non fasting labs, and 6 months for fasting labs.

## 2023-10-13 ENCOUNTER — Ambulatory Visit: Payer: Self-pay | Admitting: Internal Medicine

## 2023-10-13 LAB — LIPID PANEL W/REFLEX DIRECT LDL
Cholesterol: 209 mg/dL — ABNORMAL HIGH (ref <200)
HDL: 64 mg/dL (ref >=50)
LDL Cholesterol (Calc): 131 mg/dL — ABNORMAL HIGH
Non-HDL Cholesterol (Calc): 145 mg/dL — ABNORMAL HIGH (ref <130)
Total CHOL/HDL Ratio: 3.3 (calc) (ref <5.0)
Triglycerides: 52 mg/dL (ref <150)

## 2023-10-13 LAB — TSH: TSH: 1.67 u[IU]/mL (ref 0.35–5.50)

## 2023-10-14 DIAGNOSIS — K589 Irritable bowel syndrome without diarrhea: Secondary | ICD-10-CM | POA: Insufficient documentation

## 2023-10-14 DIAGNOSIS — Z818 Family history of other mental and behavioral disorders: Secondary | ICD-10-CM | POA: Insufficient documentation

## 2023-10-14 NOTE — Assessment & Plan Note (Signed)
 She is concerned about her risk of dementia because of her family history..   Irecommend that she read "the End of Alzheimer's: The First Program to Prevent and Reverse Cognitive Decline"  by Fonnie Iba, MD

## 2023-10-14 NOTE — Assessment & Plan Note (Signed)
 Primary symptoms are bloating and abdominal pain .  Advised to try IBGARD

## 2023-10-14 NOTE — Assessment & Plan Note (Signed)

## 2023-10-14 NOTE — Assessment & Plan Note (Signed)
 Discussed natural remedies for insomnia including herbal tea and melatonin.  Reviewd principles of good sleep hygiene,  advised to consider trial of Relaxium

## 2023-12-22 ENCOUNTER — Encounter
Admission: RE | Disposition: A | Discharge status: Home / Self Care | Payer: Self-pay | Source: Home / Self Care | Attending: Gastroenterology

## 2023-12-22 ENCOUNTER — Ambulatory Visit: Admitting: Anesthesiology

## 2023-12-22 ENCOUNTER — Ambulatory Visit
Admission: RE | Admit: 2023-12-22 | Discharge: 2023-12-22 | Disposition: A | Discharge status: Home / Self Care | Attending: Gastroenterology | Admitting: Gastroenterology

## 2023-12-22 ENCOUNTER — Encounter: Payer: Self-pay | Admitting: Gastroenterology

## 2023-12-22 ENCOUNTER — Other Ambulatory Visit: Payer: Self-pay

## 2023-12-22 DIAGNOSIS — Z83719 Family history of colon polyps, unspecified: Secondary | ICD-10-CM | POA: Insufficient documentation

## 2023-12-22 DIAGNOSIS — Z800 Family history of malignant neoplasm of digestive organs: Secondary | ICD-10-CM | POA: Diagnosis not present

## 2023-12-22 DIAGNOSIS — Z860101 Personal history of adenomatous and serrated colon polyps: Secondary | ICD-10-CM | POA: Diagnosis present

## 2023-12-22 DIAGNOSIS — K219 Gastro-esophageal reflux disease without esophagitis: Secondary | ICD-10-CM | POA: Diagnosis not present

## 2023-12-22 DIAGNOSIS — Z79899 Other long term (current) drug therapy: Secondary | ICD-10-CM | POA: Diagnosis not present

## 2023-12-22 DIAGNOSIS — Z1211 Encounter for screening for malignant neoplasm of colon: Secondary | ICD-10-CM | POA: Insufficient documentation

## 2023-12-22 HISTORY — PX: COLONOSCOPY: SHX5424

## 2023-12-22 SURGERY — COLONOSCOPY
Anesthesia: General

## 2023-12-22 MED ORDER — LIDOCAINE HCL (PF) 2 % IJ SOLN
INTRAMUSCULAR | Status: DC | PRN
Start: 2023-12-22 — End: 2023-12-22
  Administered 2023-12-22: 40 mg via INTRADERMAL

## 2023-12-22 MED ORDER — SODIUM CHLORIDE 0.9 % IV SOLN: INTRAVENOUS | Status: DC

## 2023-12-22 MED ORDER — PROPOFOL 10 MG/ML IV BOLUS
INTRAVENOUS | Status: DC | PRN
Start: 2023-12-22 — End: 2023-12-22
  Administered 2023-12-22 (×5): 20 mg via INTRAVENOUS
  Administered 2023-12-22: 30 mg via INTRAVENOUS
  Administered 2023-12-22: 20 mg via INTRAVENOUS
  Administered 2023-12-22: 50 mg via INTRAVENOUS

## 2023-12-22 NOTE — Anesthesia Preprocedure Evaluation (Signed)
 Anesthesia Evaluation  Patient identified by MRN, date of birth, ID band Patient awake    Reviewed: Allergy & Precautions, NPO status , Patient's Chart, lab work & pertinent test results  Airway Mallampati: II  TM Distance: >3 FB Neck ROM: Full    Dental  (+) Teeth Intact   Pulmonary neg pulmonary ROS   Pulmonary exam normal        Cardiovascular Exercise Tolerance: Good negative cardio ROS Normal cardiovascular exam Rhythm:Regular Rate:Normal     Neuro/Psych  Headaches negative neurological ROS  negative psych ROS   GI/Hepatic negative GI ROS, Neg liver ROS,GERD  Medicated,,  Endo/Other  negative endocrine ROS    Renal/GU negative Renal ROS  negative genitourinary   Musculoskeletal   Abdominal Normal abdominal exam  (+)   Peds negative pediatric ROS (+)  Hematology negative hematology ROS (+)   Anesthesia Other Findings Past Medical History: No date: Anxiety No date: Fibroid No date: Gallstones Aug 2012: GERD (gastroesophageal reflux disease)     Comment:  with esophagitis by EGD No date: Hematuria August 2012: Hx of colonic polyps     Comment:  repeat due 2016,  5 polyps 2009, clear 2012 ([place]) No date: Menorrhagia     Comment:  did not tolerate trial of ocps due to migraines No date: Migraine headache No date: Migraine syndrome     Comment:  since age 89,  sporadic No date: Skin cancer  Past Surgical History: 2017: BREAST BIOPSY; Right     Comment:  benign 07/31/2018: COLONOSCOPY WITH PROPOFOL ; N/A     Comment:  Procedure: COLONOSCOPY WITH PROPOFOL ;  Surgeon:               Gaylyn Gladis PENNER, MD;  Location: [facility] ENDOSCOPY;                Service: Endoscopy;  Laterality: N/A; No date: CYSTOSCOPY 07/31/2018: ESOPHAGOGASTRODUODENOSCOPY (EGD) WITH PROPOFOL ; N/A     Comment:  Procedure: ESOPHAGOGASTRODUODENOSCOPY (EGD) WITH               PROPOFOL ;  Surgeon: Gaylyn Gladis PENNER, MD;  Location:                [facility] ENDOSCOPY;  Service: Endoscopy;  Laterality: N/A; No date: laparoscopy 6/13: VAGINAL HYSTERECTOMY     Comment:  with morcellation  BMI    Body Mass Index: 20.49 kg/m      Reproductive/Obstetrics negative OB ROS                              Anesthesia Physical Anesthesia Plan  ASA: 2  Anesthesia Plan: General   Post-op Pain Management:    Induction: Intravenous  PONV Risk Score and Plan: Propofol  infusion and TIVA  Airway Management Planned: Natural Airway and Nasal Cannula  Additional Equipment:   Intra-op Plan:   Post-operative Plan:   Informed Consent: I have reviewed the patients History and Physical, chart, labs and discussed the procedure including the risks, benefits and alternatives for the proposed anesthesia with the patient or authorized representative who has indicated his/her understanding and acceptance.     Dental Advisory Given  Plan Discussed with: CRNA  Anesthesia Plan Comments:         Anesthesia Quick Evaluation

## 2023-12-22 NOTE — Transfer of Care (Signed)
 Immediate Anesthesia Transfer of Care Note  Patient: Stephanie Henderson  Procedure(s) Performed: COLONOSCOPY  Patient Location: PACU  Anesthesia Type:MAC  Level of Consciousness: drowsy  Airway & Oxygen Therapy: Patient Spontanous Breathing and Patient connected to nasal cannula oxygen  Post-op Assessment: Report given to RN and Post -op Vital signs reviewed and stable  Post vital signs: Reviewed and stable  Last Vitals:  Vitals Value Taken Time  BP 85/55 12/22/23 09:00  Temp    Pulse 83 12/22/23 09:01  Resp 15 12/22/23 09:01  SpO2 98 % 12/22/23 09:01  Vitals shown include unfiled device data.  Last Pain:  Vitals:   12/22/23 0808  TempSrc: Temporal  PainSc: 0-No pain         Complications: No notable events documented.

## 2023-12-22 NOTE — Anesthesia Postprocedure Evaluation (Signed)
 Anesthesia Post Note  Patient: Stephanie Henderson  Procedure(s) Performed: COLONOSCOPY  Patient location during evaluation: PACU Anesthesia Type: General Level of consciousness: awake Pain management: pain level controlled Vital Signs Assessment: post-procedure vital signs reviewed and stable Respiratory status: spontaneous breathing Cardiovascular status: stable Anesthetic complications: no   No notable events documented.   Last Vitals:  Vitals:   12/22/23 0808 12/22/23 0900  BP: 120/76 (!) 85/55  Pulse: 99 86  Resp: 14 16  Temp: (!) 36.2 C (!) 36.1 C  SpO2: 100% 99%    Last Pain:  Vitals:   12/22/23 0900  TempSrc: Temporal  PainSc:                  VAN STAVEREN,Elajah Kunsman

## 2023-12-22 NOTE — Op Note (Addendum)
 [facility] Gastroenterology Patient Name: Stephanie Henderson Procedure Date: 12/22/2023 8:39 AM MRN: [id number] Account #: [id number] Date of Birth: [birth year] Admit Type: Outpatient Age: 58 Room: [facility] ENDO ROOM 3 Gender: Female Note Status: Supervisor Override Instrument Name: Peds Colonoscope [id number] Procedure:             Colonoscopy Indications:           Colon cancer screening in patient at increased risk:                         Colorectal cancer in father, Surveillance: Personal                         history of adenomatous polyps on last colonoscopy 5                         years ago, Last colonoscopy: March 2020, Mixed                         irritable bowel syndrome Providers:             Ruel Kung MD, MD Referring MD:          Verneita Kettering, MD (Referring MD) Medicines:             Monitored Anesthesia Care Complications:         No immediate complications. Procedure:             Pre-Anesthesia Assessment:                        - Prior to the procedure, a History and Physical was                         performed, and patient medications, allergies and                         sensitivities were reviewed. The patient's tolerance                         of previous anesthesia was reviewed.                        - The risks and benefits of the procedure and the                         sedation options and risks were discussed with the                         patient. All questions were answered and informed                         consent was obtained.                        - ASA Grade Assessment: II - A patient with mild                         systemic disease.  After obtaining informed consent, the colonoscope was                         passed under direct vision. Throughout the procedure,                         the patient's blood pressure, pulse, and oxygen                         saturations were monitored continuously. The                          Colonoscope was introduced through the anus and                         advanced to the the cecum, identified by the                         appendiceal orifice. The colonoscopy was performed                         without difficulty. The patient tolerated the                         procedure well. The quality of the bowel preparation                         was excellent. The ileocecal valve, appendiceal                         orifice, and rectum were photographed. Findings:      The perianal and digital rectal examinations were normal.      The entire examined colon appeared normal on direct and retroflexion       views. Impression:            - The entire examined colon is normal on direct and                         retroflexion views.                        - No specimens collected. Recommendation:        - Discharge patient to home (with escort).                        - Resume previous diet.                        - Continue present medications.                        - Repeat colonoscopy in 5 years for surveillance. Procedure Code(s):     --- Professional ---                        [phone], Colonoscopy, flexible; diagnostic, including                         collection of specimen(s) by brushing or washing, when  performed (separate procedure) Diagnosis Code(s):     --- Professional ---                        Z80.0, Family history of malignant neoplasm of                         digestive organs                        Z86.010, Personal history of colonic polyps CPT copyright 2022 American Medical Association. All rights reserved. The codes documented in this report are preliminary and upon coder review may  be revised to meet current compliance requirements. Ruel Kung, MD Ruel Kung MD, MD 12/22/2023 8:57:54 AM This report has been signed electronically. Number of Addenda: 0 Note Initiated On: 12/22/2023 8:39 AM Scope Withdrawal  Time: 0 hours 9 minutes 25 seconds  Total Procedure Duration: 0 hours 13 minutes 19 seconds  Estimated Blood Loss:  Estimated blood loss: none.      [facility]

## 2023-12-22 NOTE — H&P (Signed)
 Ruel Kung , MD [address], [place], [place], [address] Phone: [phone] Fax: [phone]  Primary Care Physician:  Marylynn Verneita CROME, MD   Pre-Procedure History & Physical: HPI:  Stephanie Henderson is a 58 y.o. female is here for an colonoscopy.   Past Medical History:  Diagnosis Date   Anxiety    Fibroid    Gallstones    GERD (gastroesophageal reflux disease) Aug 2012   with esophagitis by EGD   Hematuria    Hx of colonic polyps August 2012   repeat due 2016,  5 polyps 2009, clear 2012 ([place])   Menorrhagia    did not tolerate trial of ocps due to migraines   Migraine headache    Migraine syndrome    since age 78,  sporadic   Skin cancer     Past Surgical History:  Procedure Laterality Date   BREAST BIOPSY Right 2017   benign   COLONOSCOPY WITH PROPOFOL  N/A 07/31/2018   Procedure: COLONOSCOPY WITH PROPOFOL ;  Surgeon: Gaylyn Gladis PENNER, MD;  Location: [facility] ENDOSCOPY;  Service: Endoscopy;  Laterality: N/A;   CYSTOSCOPY     ESOPHAGOGASTRODUODENOSCOPY (EGD) WITH PROPOFOL  N/A 07/31/2018   Procedure: ESOPHAGOGASTRODUODENOSCOPY (EGD) WITH PROPOFOL ;  Surgeon: Gaylyn Gladis PENNER, MD;  Location: [facility] ENDOSCOPY;  Service: Endoscopy;  Laterality: N/A;   laparoscopy     VAGINAL HYSTERECTOMY  6/13   with morcellation    Prior to Admission medications   Medication Sig Start Date End Date Taking? Authorizing Provider  ALPRAZolam  (XANAX ) 0.5 MG tablet Take 1 tablet (0.5 mg total) by mouth at bedtime as needed. 10/12/23  Yes Marylynn Verneita CROME, MD  cyclobenzaprine  (FLEXERIL ) 10 MG tablet Take 1 tablet (10 mg total) by mouth at bedtime. 10/12/23  Yes Marylynn Verneita CROME, MD  pantoprazole  (PROTONIX ) 40 MG tablet Take 1 tablet (40 mg total) by mouth daily. 10/12/23  Yes Marylynn Verneita CROME, MD    Allergies as of 12/08/2023 - Review Complete 10/12/2023  Allergen Reaction Noted   Propoxyphene  06/02/2015   Tetracyclines & related Diarrhea and Nausea Only 03/02/2011     Family History  Problem Relation Age of Onset   Hyperlipidemia Mother    Mental illness Mother        depression   Dementia Mother    Colon polyps Mother    Irritable bowel syndrome Mother    Colon cancer Father 42   Depression Father    Mental illness Father    Diabetes Neg Hx    Heart disease Neg Hx    Breast cancer Neg Hx    Ovarian cancer Neg Hx    Pancreatic cancer Neg Hx    Stomach cancer Neg Hx    Liver disease Neg Hx     Social History   Socioeconomic History   Marital status: Married    Spouse name: Not on file   Number of children: 2   Years of education: Not on file   Highest education level: Not on file  Occupational History   Occupation: RN - [employer]  Tobacco Use   Smoking status: Never   Smokeless tobacco: Never  Vaping Use   Vaping status: Never Used  Substance and Sexual Activity   Alcohol use: Not Currently    Alcohol/week: 3.0 standard drinks of alcohol    Types: 3 Glasses of wine per week   Drug use: No   Sexual activity: Yes    Birth control/protection: Surgical  Other  Topics Concern   Not on file  Social History Narrative   Not on file   Social Drivers of Health   Financial Resource Strain: Not on file  Food Insecurity: Not on file  Transportation Needs: Not on file  Physical Activity: Insufficiently Active (08/22/2017)   Exercise Vital Sign    Days of Exercise per Week: 2 days    Minutes of Exercise per Session: 30 min  Stress: Not on file  Social Connections: Not on file  Intimate Partner Violence: Not on file    Review of Systems: See HPI, otherwise negative ROS  Physical Exam: LMP 02/20/2011  General:   Alert,  pleasant and cooperative in NAD Head:  Normocephalic and atraumatic. Neck:  Supple; no masses or thyromegaly. Lungs:  Clear throughout to auscultation, normal respiratory effort.    Heart:  +S1, +S2, Regular rate and rhythm, No edema. Abdomen:  Soft, nontender and nondistended. Normal bowel  sounds, without guarding, and without rebound.   Neurologic:  Alert and  oriented x4;  grossly normal neurologically.  Impression/Plan: Stephanie Henderson is here for an colonoscopy to be performed for surveillance due to prior history of colon polyps   Risks, benefits, limitations, and alternatives regarding  colonoscopy have been reviewed with the patient.  Questions have been answered.  All parties agreeable.   Ruel Kung, MD  12/22/2023, 8:06 AM

## 2024-01-13 ENCOUNTER — Ambulatory Visit
Admission: RE | Admit: 2024-01-13 | Discharge: 2024-01-13 | Disposition: A | Discharge status: Home / Self Care | Source: Ambulatory Visit | Attending: Internal Medicine | Admitting: Internal Medicine

## 2024-01-13 DIAGNOSIS — Z1231 Encounter for screening mammogram for malignant neoplasm of breast: Secondary | ICD-10-CM

## 2024-01-20 ENCOUNTER — Other Ambulatory Visit: Payer: Self-pay | Admitting: Internal Medicine

## 2024-01-20 DIAGNOSIS — R928 Other abnormal and inconclusive findings on diagnostic imaging of breast: Secondary | ICD-10-CM

## 2024-01-24 ENCOUNTER — Other Ambulatory Visit: Payer: Self-pay | Admitting: Internal Medicine

## 2024-01-24 ENCOUNTER — Ambulatory Visit
Admission: RE | Admit: 2024-01-24 | Discharge: 2024-01-24 | Disposition: A | Discharge status: Home / Self Care | Source: Ambulatory Visit | Attending: Internal Medicine | Admitting: Internal Medicine

## 2024-01-24 DIAGNOSIS — R921 Mammographic calcification found on diagnostic imaging of breast: Secondary | ICD-10-CM

## 2024-01-24 DIAGNOSIS — R928 Other abnormal and inconclusive findings on diagnostic imaging of breast: Secondary | ICD-10-CM

## 2024-01-25 ENCOUNTER — Ambulatory Visit: Payer: Self-pay | Admitting: Internal Medicine

## 2024-01-25 ENCOUNTER — Ambulatory Visit
Admission: RE | Admit: 2024-01-25 | Discharge: 2024-01-25 | Disposition: A | Discharge status: Home / Self Care | Source: Ambulatory Visit | Attending: Internal Medicine | Admitting: Internal Medicine

## 2024-01-25 DIAGNOSIS — R921 Mammographic calcification found on diagnostic imaging of breast: Secondary | ICD-10-CM

## 2024-01-25 HISTORY — PX: BREAST BIOPSY: SHX20

## 2024-01-27 LAB — SURGICAL PATHOLOGY

## 2024-01-30 ENCOUNTER — Encounter: Payer: Self-pay | Admitting: Internal Medicine

## 2024-02-04 ENCOUNTER — Ambulatory Visit: Payer: Self-pay | Admitting: Internal Medicine

## 2024-04-15 ENCOUNTER — Other Ambulatory Visit: Payer: Self-pay | Admitting: Internal Medicine

## 2024-04-16 ENCOUNTER — Ambulatory Visit: Admitting: Internal Medicine

## 2024-04-17 NOTE — Telephone Encounter (Signed)
 Refilled: 10/12/2023 Last OV: 10/12/2023 Next OV: not scheduled

## 2024-05-15 ENCOUNTER — Ambulatory Visit: Admitting: Family

## 2024-05-15 ENCOUNTER — Encounter: Payer: Self-pay | Admitting: Family

## 2024-05-15 VITALS — BP 110/66 | HR 81 | Temp 98.7°F | Ht 63.0 in | Wt 118.2 lb

## 2024-05-15 DIAGNOSIS — H6121 Impacted cerumen, right ear: Secondary | ICD-10-CM

## 2024-05-15 DIAGNOSIS — J3489 Other specified disorders of nose and nasal sinuses: Secondary | ICD-10-CM | POA: Diagnosis not present

## 2024-05-15 MED ORDER — METHYLPREDNISOLONE 4 MG PO TBPK: ORAL_TABLET | ORAL | 0 refills | Status: AC

## 2024-05-15 MED ORDER — NEOMYCIN-POLYMYXIN-HC 3.5-10000-1 OT SOLN: 3.0000 [drp] | Freq: Four times a day (QID) | OTIC | 0 refills | Status: AC

## 2024-05-15 NOTE — Progress Notes (Signed)
 "  Acute Office Visit  Subjective:     Patient ID: Stephanie Henderson, female    DOB: [birth year], 58 y.o.   MRN: [id number]  Chief Complaint  Patient presents with   Ear Fullness   Ear Problem    HPI Patient is in today  with complaints of fullness in her right ear had a cerumen impaction.  Reports that she was seen in the clinic about a week ago with a cerumen impaction that they were unable to lavage.  Since that time she has been using Debrox over-the-counter earwax removal and has not been able to get anything out of her ear.  She is concerned about infection.  Reports having some dizziness and mild discomfort.  Also reports some pressure in her sinuses on the right side.  Mild congestion and cough.  Review of Systems  Constitutional:  Negative for chills and fever.  HENT:  Positive for congestion, ear pain and sinus pain.        Cerumen impaction  Respiratory: Negative.    Cardiovascular: Negative.   Musculoskeletal: Negative.   Neurological: Negative.   Endo/Heme/Allergies: Negative.   Psychiatric/Behavioral: Negative.     Past Medical History:  Diagnosis Date   Anxiety    Fibroid    Gallstones    GERD (gastroesophageal reflux disease) Aug 2012   with esophagitis by EGD   Hematuria    Hx of colonic polyps August 2012   repeat due 2016,  5 polyps 2009, clear 2012 ([place])   Menorrhagia    did not tolerate trial of ocps due to migraines   Migraine headache    Migraine syndrome    since age 21,  sporadic   Skin cancer     Social History   Socioeconomic History   Marital status: Married    Spouse name: Not on file   Number of children: 2   Years of education: Not on file   Highest education level: Not on file  Occupational History   Occupation: [employer] - [employer]  Tobacco Use   Smoking status: Never   Smokeless tobacco: Never  Vaping Use   Vaping status: Never Used  Substance and Sexual Activity   Alcohol use: Not  Currently    Alcohol/week: 3.0 standard drinks of alcohol    Types: 3 Glasses of wine per week   Drug use: No   Sexual activity: Yes    Birth control/protection: Surgical  Other Topics Concern   Not on file  Social History Narrative   Not on file   Social Drivers of Health   Tobacco Use: Low Risk (05/15/2024)   Patient History    Smoking Tobacco Use: Never    Smokeless Tobacco Use: Never    Passive Exposure: Not on file  Financial Resource Strain: Not on file  Food Insecurity: Not on file  Transportation Needs: Not on file  Physical Activity: Not on file  Stress: Not on file  Social Connections: Not on file  Intimate Partner Violence: Not on file  Depression (EYV7-0): Low Risk (05/15/2024)   Depression (PHQ2-9)    PHQ-2 Score: 1  Alcohol Screen: Not on file  Housing: Unknown (11/28/2023)   Received from [facility]   Epic    Unable to Pay for Housing in the Last Year: Not on file    Number of Times Moved in the Last Year: Not on file    At any time in the past 12 months, were you homeless  or living in a shelter (including now)?: No  Utilities: Not on file  Health Literacy: Not on file    Past Surgical History:  Procedure Laterality Date   BREAST BIOPSY Right 2017   benign   BREAST BIOPSY Right 01/25/2024   MM RT BREAST BX W LOC DEV 1ST LESION IMAGE BX SPEC STEREO GUIDE 01/25/2024 [facility]   COLONOSCOPY N/A 12/22/2023   Procedure: COLONOSCOPY;  Surgeon: Therisa Bi, MD;  Location: [facility] ENDOSCOPY;  Service: Endoscopy;  Laterality: N/A;   COLONOSCOPY WITH PROPOFOL  N/A 07/31/2018   Procedure: COLONOSCOPY WITH PROPOFOL ;  Surgeon: Gaylyn Gladis PENNER, MD;  Location: [facility] ENDOSCOPY;  Service: Endoscopy;  Laterality: N/A;   CYSTOSCOPY     ESOPHAGOGASTRODUODENOSCOPY (EGD) WITH PROPOFOL  N/A 07/31/2018   Procedure: ESOPHAGOGASTRODUODENOSCOPY (EGD) WITH PROPOFOL ;  Surgeon: Gaylyn Gladis PENNER, MD;  Location: [facility] ENDOSCOPY;  Service:  Endoscopy;  Laterality: N/A;   laparoscopy     VAGINAL HYSTERECTOMY  6/13   with morcellation    Family History  Problem Relation Age of Onset   Hyperlipidemia Mother    Mental illness Mother        depression   Dementia Mother    Colon polyps Mother    Irritable bowel syndrome Mother    Colon cancer Father 25   Depression Father    Mental illness Father    Diabetes Neg Hx    Heart disease Neg Hx    Breast cancer Neg Hx    Ovarian cancer Neg Hx    Pancreatic cancer Neg Hx    Stomach cancer Neg Hx    Liver disease Neg Hx     Allergies[1]  Medications Ordered Prior to Encounter[2]  BP 110/66 (BP Location: Right Arm, Patient Position: Sitting)   Pulse 81   Temp 98.7 F (37.1 C) (Oral)   Ht 5' 3 (1.6 m)   Wt 118 lb 3.2 oz (53.6 kg)   LMP 02/20/2011   SpO2 99%   BMI 20.94 kg/m chart      Objective:    BP 110/66 (BP Location: Right Arm, Patient Position: Sitting)   Pulse 81   Temp 98.7 F (37.1 C) (Oral)   Ht 5' 3 (1.6 m)   Wt 118 lb 3.2 oz (53.6 kg)   LMP 02/20/2011   SpO2 99%   BMI 20.94 kg/m    Physical Exam Constitutional:      Appearance: Normal appearance. She is normal weight.  HENT:     Right Ear: There is impacted cerumen.     Left Ear: Tympanic membrane, ear canal and external ear normal.     Ears:     Comments: Right cerumen impaction:  Informed consent was obtained and peroxide gel was inserted into the ears bilaterally using the lavage kit the ears were lavaged until clean.Inspection with a cerumen spoon removed residual wax. Patient tolerated the procedure well.  Tympanic membrane normal.  External auditory canal red and tender.    Nose: Congestion present.     Mouth/Throat:     Mouth: Mucous membranes are moist.  Cardiovascular:     Rate and Rhythm: Normal rate and regular rhythm.     Pulses: Normal pulses.     Heart sounds: Normal heart sounds.  Pulmonary:     Effort: Pulmonary effort is normal.     Breath sounds:  Normal breath sounds.  Musculoskeletal:        General: Normal range of motion.     Cervical back: Normal range of motion.  Skin:    General: Skin is warm and dry.  Neurological:     General: No focal deficit present.     Mental Status: She is alert and oriented to person, place, and time. Mental status is at baseline.  Psychiatric:        Mood and Affect: Mood normal.        Behavior: Behavior normal.        Thought Content: Thought content normal.        Judgment: Judgment normal.    No results found for any visits on 05/15/24.      Assessment & Plan:   Problem List Items Addressed This Visit   None Visit Diagnoses       Right ear impacted cerumen    -  Primary     Sinus pressure           Meds ordered this encounter  Medications   methylPREDNISolone  (MEDROL  DOSEPAK) 4 MG TBPK tablet    Sig: As directed    Dispense:  21 tablet    Refill:  0   neomycin -polymyxin-hydrocortisone (CORTISPORIN) OTIC solution    Sig: Place 3 drops into the right ear 4 (four) times daily.    Dispense:  10 mL    Refill:  0    If symptoms worsen or persist.  Recheck as scheduled and sooner as needed.  No follow-ups on file.  Jaydynn Wolford B Shalva Rozycki, FNP       [1] Allergies Allergen Reactions   Propoxyphene     Other reaction(s): Other (See Comments) Unknown   Tetracyclines & Related Diarrhea and Nausea Only  [2] Current Outpatient Medications on File Prior to Visit  Medication Sig Dispense Refill   ALPRAZolam  (XANAX ) 0.5 MG tablet Take 1 tablet (0.5 mg total) by mouth at bedtime as needed. 30 tablet 0   cyclobenzaprine  (FLEXERIL ) 10 MG tablet TAKE 1 TABLET BY MOUTH EVERYDAY AT BEDTIME 90 tablet 1   pantoprazole  (PROTONIX ) 40 MG tablet Take 1 tablet (40 mg total) by mouth daily. 90 tablet 3   No current facility-administered medications on file prior to visit.  "

## 2024-10-16 ENCOUNTER — Encounter: Admitting: Internal Medicine
